# Patient Record
Sex: Female | Born: 1935 | Race: White | Hispanic: No | Marital: Single | Smoking: Never smoker
Health system: Southern US, Community
[De-identification: ages and names within clinical notes are randomized; demographics above are authoritative.]

## PROBLEM LIST (undated history)

## (undated) DIAGNOSIS — I10 Essential (primary) hypertension: Secondary | ICD-10-CM

## (undated) DIAGNOSIS — E0590 Thyrotoxicosis, unspecified without thyrotoxic crisis or storm: Secondary | ICD-10-CM

## (undated) DIAGNOSIS — M109 Gout, unspecified: Secondary | ICD-10-CM

## (undated) DIAGNOSIS — F419 Anxiety disorder, unspecified: Secondary | ICD-10-CM

## (undated) DIAGNOSIS — Z9981 Dependence on supplemental oxygen: Secondary | ICD-10-CM

## (undated) DIAGNOSIS — I071 Rheumatic tricuspid insufficiency: Secondary | ICD-10-CM

## (undated) DIAGNOSIS — R7303 Prediabetes: Secondary | ICD-10-CM

## (undated) DIAGNOSIS — E669 Obesity, unspecified: Secondary | ICD-10-CM

## (undated) DIAGNOSIS — Z8489 Family history of other specified conditions: Secondary | ICD-10-CM

## (undated) DIAGNOSIS — I4819 Other persistent atrial fibrillation: Secondary | ICD-10-CM

## (undated) DIAGNOSIS — R0609 Other forms of dyspnea: Secondary | ICD-10-CM

## (undated) DIAGNOSIS — M1990 Unspecified osteoarthritis, unspecified site: Secondary | ICD-10-CM

## (undated) DIAGNOSIS — M545 Low back pain, unspecified: Secondary | ICD-10-CM

## (undated) DIAGNOSIS — I340 Nonrheumatic mitral (valve) insufficiency: Secondary | ICD-10-CM

## (undated) DIAGNOSIS — J189 Pneumonia, unspecified organism: Secondary | ICD-10-CM

## (undated) DIAGNOSIS — I499 Cardiac arrhythmia, unspecified: Secondary | ICD-10-CM

## (undated) DIAGNOSIS — R0600 Dyspnea, unspecified: Secondary | ICD-10-CM

## (undated) DIAGNOSIS — G8929 Other chronic pain: Secondary | ICD-10-CM

## (undated) HISTORY — PX: SHOULDER ARTHROSCOPY: SHX128

## (undated) HISTORY — DX: Thyrotoxicosis, unspecified without thyrotoxic crisis or storm: E05.90

## (undated) HISTORY — DX: Obesity, unspecified: E66.9

## (undated) HISTORY — DX: Dyspnea, unspecified: R06.00

## (undated) HISTORY — DX: Other persistent atrial fibrillation: I48.19

## (undated) HISTORY — DX: Rheumatic tricuspid insufficiency: I07.1

## (undated) HISTORY — PX: EYE SURGERY: SHX253

## (undated) HISTORY — PX: CATARACT EXTRACTION W/ INTRAOCULAR LENS  IMPLANT, BILATERAL: SHX1307

## (undated) HISTORY — DX: Unspecified osteoarthritis, unspecified site: M19.90

## (undated) HISTORY — DX: Nonrheumatic mitral (valve) insufficiency: I34.0

## (undated) HISTORY — PX: EXCISIONAL HEMORRHOIDECTOMY: SHX1541

## (undated) HISTORY — PX: TONSILLECTOMY: SUR1361

## (undated) HISTORY — DX: Essential (primary) hypertension: I10

## (undated) HISTORY — DX: Other forms of dyspnea: R06.09

---

## 1961-07-08 HISTORY — PX: OVARIAN CYST REMOVAL: SHX89

## 1974-07-08 HISTORY — PX: KNEE CARTILAGE SURGERY: SHX688

## 1998-09-27 ENCOUNTER — Other Ambulatory Visit: Admission: RE | Admit: 1998-09-27 | Discharge: 1998-09-27 | Payer: Self-pay | Admitting: *Deleted

## 1999-11-21 ENCOUNTER — Other Ambulatory Visit: Admission: RE | Admit: 1999-11-21 | Discharge: 1999-11-21 | Payer: Self-pay | Admitting: *Deleted

## 1999-11-29 ENCOUNTER — Encounter: Admission: RE | Admit: 1999-11-29 | Discharge: 1999-11-29 | Payer: Self-pay | Admitting: Family Medicine

## 1999-11-29 ENCOUNTER — Encounter: Payer: Self-pay | Admitting: Family Medicine

## 2000-11-26 ENCOUNTER — Other Ambulatory Visit: Admission: RE | Admit: 2000-11-26 | Discharge: 2000-11-26 | Payer: Self-pay | Admitting: *Deleted

## 2001-12-17 ENCOUNTER — Other Ambulatory Visit: Admission: RE | Admit: 2001-12-17 | Discharge: 2001-12-17 | Payer: Self-pay | Admitting: *Deleted

## 2001-12-28 ENCOUNTER — Encounter: Payer: Self-pay | Admitting: *Deleted

## 2001-12-28 ENCOUNTER — Encounter: Admission: RE | Admit: 2001-12-28 | Discharge: 2001-12-28 | Payer: Self-pay | Admitting: *Deleted

## 2004-04-05 ENCOUNTER — Other Ambulatory Visit: Admission: RE | Admit: 2004-04-05 | Discharge: 2004-04-05 | Payer: Self-pay | Admitting: *Deleted

## 2004-04-17 ENCOUNTER — Encounter: Admission: RE | Admit: 2004-04-17 | Discharge: 2004-04-17 | Payer: Self-pay | Admitting: *Deleted

## 2004-04-27 ENCOUNTER — Encounter: Admission: RE | Admit: 2004-04-27 | Discharge: 2004-04-27 | Payer: Self-pay | Admitting: *Deleted

## 2004-07-26 ENCOUNTER — Ambulatory Visit: Payer: Self-pay | Admitting: Internal Medicine

## 2004-08-13 ENCOUNTER — Ambulatory Visit: Payer: Self-pay | Admitting: Internal Medicine

## 2004-10-09 ENCOUNTER — Encounter: Admission: RE | Admit: 2004-10-09 | Discharge: 2004-10-09 | Payer: Self-pay | Admitting: *Deleted

## 2004-12-20 ENCOUNTER — Encounter: Admission: RE | Admit: 2004-12-20 | Discharge: 2004-12-20 | Payer: Self-pay | Admitting: General Surgery

## 2005-01-22 ENCOUNTER — Ambulatory Visit: Payer: Self-pay | Admitting: Internal Medicine

## 2005-04-18 ENCOUNTER — Encounter: Admission: RE | Admit: 2005-04-18 | Discharge: 2005-04-18 | Payer: Self-pay | Admitting: Obstetrics and Gynecology

## 2005-05-08 ENCOUNTER — Encounter: Admission: RE | Admit: 2005-05-08 | Discharge: 2005-05-24 | Payer: Self-pay | Admitting: Neurosurgery

## 2005-06-14 ENCOUNTER — Other Ambulatory Visit: Admission: RE | Admit: 2005-06-14 | Discharge: 2005-06-14 | Payer: Self-pay | Admitting: Obstetrics and Gynecology

## 2006-01-21 ENCOUNTER — Ambulatory Visit: Payer: Self-pay | Admitting: Internal Medicine

## 2006-06-12 ENCOUNTER — Encounter: Admission: RE | Admit: 2006-06-12 | Discharge: 2006-06-12 | Payer: Self-pay | Admitting: Obstetrics and Gynecology

## 2006-06-24 ENCOUNTER — Encounter: Admission: RE | Admit: 2006-06-24 | Discharge: 2006-06-24 | Payer: Self-pay | Admitting: Obstetrics and Gynecology

## 2007-01-01 ENCOUNTER — Emergency Department (HOSPITAL_COMMUNITY): Admission: EM | Admit: 2007-01-01 | Discharge: 2007-01-01 | Payer: Self-pay | Admitting: Emergency Medicine

## 2007-05-06 ENCOUNTER — Ambulatory Visit: Payer: Self-pay | Admitting: Internal Medicine

## 2007-09-02 ENCOUNTER — Encounter: Admission: RE | Admit: 2007-09-02 | Discharge: 2007-09-02 | Payer: Self-pay | Admitting: Family Medicine

## 2008-05-04 DIAGNOSIS — J309 Allergic rhinitis, unspecified: Secondary | ICD-10-CM | POA: Insufficient documentation

## 2008-05-04 DIAGNOSIS — J45998 Other asthma: Secondary | ICD-10-CM

## 2008-05-05 ENCOUNTER — Ambulatory Visit: Payer: Self-pay | Admitting: Internal Medicine

## 2009-02-07 ENCOUNTER — Telehealth (INDEPENDENT_AMBULATORY_CARE_PROVIDER_SITE_OTHER): Payer: Self-pay | Admitting: *Deleted

## 2009-02-07 ENCOUNTER — Ambulatory Visit: Payer: Self-pay | Admitting: Internal Medicine

## 2009-05-04 ENCOUNTER — Ambulatory Visit: Payer: Self-pay | Admitting: Internal Medicine

## 2009-05-22 ENCOUNTER — Encounter: Payer: Self-pay | Admitting: Internal Medicine

## 2009-09-04 ENCOUNTER — Ambulatory Visit: Payer: Self-pay | Admitting: Internal Medicine

## 2009-09-26 ENCOUNTER — Ambulatory Visit: Payer: Self-pay | Admitting: Internal Medicine

## 2010-02-02 ENCOUNTER — Telehealth: Payer: Self-pay | Admitting: Internal Medicine

## 2010-02-02 ENCOUNTER — Ambulatory Visit: Payer: Self-pay | Admitting: Internal Medicine

## 2010-07-29 ENCOUNTER — Encounter: Payer: Self-pay | Admitting: Obstetrics and Gynecology

## 2010-08-07 NOTE — Progress Notes (Signed)
Summary: appt  Phone Note Call from Patient Call back at Home Phone [phone]   Caller: Patient Call For: Kaiyu Mirabal Reason for Call: Talk to Nurse Summary of Call: Bronchitis x 1 week, cough, congestion, has been taking antibiotic, took a little cough med, but hasn't really helped.  Would like to see CDY today if possible. Initial call taken by: Eugene Gavia,  February 02, 2010 8:29 AM  Follow-up for Phone Call        Spoke with pt.  Pt c/o prod cough with a small amount of green mucus and nasal and chest congestion x 1 wk.  Denies f/c/s.  Took sulfa two times a day x 1wk -- this is "helping but not doing the job."  Requesting to see Dr. Maple Hudson today.  Dr. Maple Hudson, pls advise if you would like to work pt in today.  Thanks! Gweneth Dimitri RN  February 02, 2010 9:15 AM   Additional Follow-up for Phone Call Additional follow up Details #1::        OK Additional Follow-up by: Waymon Budge MD,  February 02, 2010 3:57 PM

## 2010-08-07 NOTE — Assessment & Plan Note (Signed)
Summary: accute visit-cough,congestion(nasal and chest)/kcw   Primary Provider/Referring Provider:  Benedetto Goad  CC:  Accute visit-? sinus infection and bronchitis; started sulfa abx x 1week and no help.Marland Kitchen  History of Present Illness: September 04, 2009--Presents for an acute office visit. Complains of SOB, tightness in chest, cough with small amounts of clear mucus x2.5weeks . Seen by PCP initially , rx amoxicillin, zpack and prednisone taper. This helped but not totally resolved. Still has alot of coughing and wheeizng. Finished zpack /steroids since then. XRAY per pt w/ no acute changes. Last xray here 10/10 w/ chronic changes. Has had several episodes over last years of recurrent bronchitis. Denies chest pain,  orthopnea, hemoptysis, fever, n/v/d, edema, headache  September 26, 2009- Asthma, allergic rhinitis Was seen by NP for an acute bronchitis- her third since July. When here with NP got prednisone taper, ventolin and cough syrup. Says she feels very much better.The benzonatate mad her very drowsy. Denies sneeze, drip, cough or wheeze. Not bothered yet this Spring by obvious seasonal allergy, but she expects to be later in season.  February 02, 2010- Asthma, allergic rhinits Acute visit- Got hot and chilled in and out of [facility] on a bus trip recently and now for past week has head and chest congestion with some green but no fever or sore throat and no GI. Finishing a week of sulfa drug that didn't help.    Asthma History    Initial Asthma Severity Rating:    Age range: 12+ years    Symptoms: 0-2 days/week    Nighttime Awakenings: 0-2/month    Interferes w/ normal activity: no limitations    SABA use (not for EIB): 0-2 days/week    Asthma Severity Assessment: Intermittent   Preventive Screening-Counseling & Management  Alcohol-Tobacco     Smoking Status: never  Current Medications (verified): 1)  Qvar 80 Mcg/act Aers (Beclomethasone Dipropionate) .Marland Kitchen.. 1 Puff and Rinse Twice Daily 2)   Foradil Aerolizer 12 Mcg Caps (Formoterol Fumarate) .... Two Times A Day 3)  Cardizem Cd 360 Mg Xr24h-Cap (Diltiazem Hcl Coated Beads) .... Take 1 Tablet By Mouth Once A Day 4)  Premarin 0.9 Mg Tabs (Estrogens Conjugated) .... Take 1 Tablet By Mouth Once A Day 5)  Diovan 160 Mg Tabs (Valsartan) .... Take 1 Tablet By Mouth Once A Day 6)  Clobetasol Propionate 0.05 % Oint (Clobetasol Propionate) .... As Needed 7)  Medroxyprogesterone Acetate 5 Mg Tabs (Medroxyprogesterone Acetate) .... Take 1 Tablet By Mouth Once A Day 8)  Methimazole 10 Mg Tabs (Methimazole) .... Once Daily 9)  Ventolin Hfa 108 (90 Base) Mcg/act Aers (Albuterol Sulfate) .... Inhale 2 Puffs Every Four Hours As Needed  Allergies (verified): 1)  ! Codeine 2)  ! 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  The patient denies shortness of breath at rest, productive cough, coughing up  blood, chest pain, irregular heartbeats, acid heartburn, indigestion, loss of appetite, weight change, abdominal pain, difficulty swallowing, sore throat, tooth/dental problems, headaches, nasal congestion/difficulty breathing through nose, and sneezing.    Vital Signs:  Patient profile:   77 year old female Height:      64 inches Weight:      197.25 pounds BMI:      33.98 O2 Sat:      96 % on Room air Pulse rate:   67 / minute BP sitting:   120 / 58  (left arm) Cuff size:   large  Vitals Entered By: Reynaldo Minium CMA (February 02, 2010 3:04 PM)  O2 Flow:  Room air CC: Accute visit-? sinus infection and bronchitis; started sulfa abx x 1week and no help.   Physical Exam  Additional Exam:  General: A/Ox3; pleasant and cooperative, NAD, SKIN: no rash, lesions NODES: no lymphadenopathy HEENT: [place]/AT, EOM- WNL, Conjuctivae- clear, PERRLA, TM-WNL, Nose- clear, Throat- clear and wnl, Mallampatti III-IV NECK: Supple w/ fair ROM, JVD- none, normal carotid impulses w/o bruits Thyroid-  CHEST:Very clear HEART: RRR, no m/g/r heard ABDOMEN: Soft and nl; ZDG:UYQI, nl pulses, no edema  NEURO: Grossly intact to observation      Impression & Recommendations:  Problem # 1:  ALLERGIC RHINITIS (ICD-477.9)  Rhinosinusitis. She describes a sinus infection that responded to sulfa over a month ago. We will give Z pak with a standby script for prednsione. Encourage fluids. Prepared to extend antibiotic, add decongestant, Neti pot and mucolytic if needed.  Problem # 2:  ASTHMA (ICD-493.90) She has not yet lost control. We discussed meds.  Medications Added to Medication List This Visit: 1)  Prednisone 10 Mg Tabs (Prednisone) .Marland Kitchen.. 1 tab four times daily x 2 days, 3 times daily x 2 days, 2 times daily x 2 days, 1 time daily x 2 days 2)  Zithromax Z-pak 250 Mg Tabs (Azithromycin) .... 2 today then one daily  Other Orders: Est. Patient Level IV (34742)  Patient Instructions: 1)  Please schedule a follow-up appointment in 1 year or as scheduled 2)  Script for Z pak antibiotic and standby script for some prednisone if needed. Prescriptions: ZITHROMAX Z-PAK 250 MG TABS (AZITHROMYCIN) 2 today then one daily  #1 pak x 0   Entered and Authorized by:   Waymon Budge MD   Signed by:   Waymon Budge MD on 02/02/2010   Method used:   Electronically to         [facility] [address] [phone]* (retail)       [address] [place], [place]  [address]       Ph: [phone]       Fax: [phone]   RxID:   [id number] PREDNISONE 10 MG TABS (PREDNISONE) 1 tab four times daily x 2 days, 3 times daily x 2 days, 2 times daily x 2 days, 1 time daily x 2 days  #20 x 0   Entered and Authorized by:   Waymon Budge MD   Signed by:   Waymon Budge MD on 02/02/2010   Method used:   Electronically to        [facility] [address] [phone]* (retail)       [address] [place], [place]  [address]       Ph: [phone]       Fax: [phone]   RxID:   [id number]

## 2010-08-07 NOTE — Assessment & Plan Note (Signed)
Summary: 2wk reck/klw   Primary Provider/Referring Provider:  Benedetto Goad  CC:  2 wk follow up.  states breathing is better.  states cough and chest tightness have resolved.  denies wheezing.  No complaints.  .  History of Present Illness:  May 04, 2009- Asthma, allergic rhinitis Saqys she has had only two real asthma"attacks" in her life- the second being when she saw NP in August. Likes the current cool weather much better than the summer heat. Has had flu She continues Foradil and Qvar. Doesn't need rescue inhaler often.  Aware she snores, but denies daytime tiredness. We discussed sleep apnea.  September 04, 2009--Presents for an acute office visit. Complains of SOB, tightness in chest, cough with small amounts of clear mucus x2.5weeks . Seen by PCP initially , rx amoxicillin, zpack and prednisone taper. This helped but not totally resolved. Still has alot of coughing and wheeizng. Finished zpack /steroids since then. XRAY per pt w/ no acute changes. Last xray here 10/10 w/ chronic changes. Has had several episodes over last years of recurrent bronchitis. Denies chest pain,  orthopnea, hemoptysis, fever, n/v/d, edema, headache  September 26, 2009- Asthma, allergic rhinitis Was seen by NP for an acute bronchitis- her third since July. When here with NP got prednisone taper, ventolin and cough syrup. Says she feels very much better.The benzonatate mad her very drowsy. Denies sneeze, drip, cough or wheeze. Not bothered yet this Spring by obvious seasonal allergy, but she expects to be later in season.    Current Medications (verified): 1)  Qvar 80 Mcg/act Aers (Beclomethasone Dipropionate) .Marland Kitchen.. 1 Puff and Rinse Twice Daily 2)  Foradil Aerolizer 12 Mcg Caps (Formoterol Fumarate) .... Two Times A Day 3)  Cardizem Cd 360 Mg Xr24h-Cap (Diltiazem Hcl Coated Beads) .... Take 1 Tablet By Mouth Once A Day 4)  Premarin 0.9 Mg Tabs (Estrogens Conjugated) .... Take 1 Tablet By Mouth Once A Day 5)   Diovan 160 Mg Tabs (Valsartan) .... Take 1 Tablet By Mouth Once A Day 6)  Clobetasol Propionate 0.05 % Oint (Clobetasol Propionate) .... As Needed 7)  Medroxyprogesterone Acetate 5 Mg Tabs (Medroxyprogesterone Acetate) .... Take 1 Tablet By Mouth Once A Day 8)  Methimazole 10 Mg Tabs (Methimazole) .... Once Daily 9)  Ventolin Hfa 108 (90 Base) Mcg/act Aers (Albuterol Sulfate) .... Inhale 2 Puffs Every Four Hours As Needed 10)  Hydromet 5-1.5 Mg/33ml Syrp (Hydrocodone-Homatropine) .Marland Kitchen.. 1 Tsp Every 4-6 Hrs As Needed Cough  Allergies (verified): 1)  ! Codeine 2)  ! 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    Vital Signs:  Patient profile:   77 year old female Height:      64 inches Weight:      196.25 pounds BMI:     33.81  O2 Sat:      98 % on Room air Pulse rate:   62 / minute BP sitting:   130 / 68  (right arm) Cuff size:   regular  Vitals Entered By: Gweneth Dimitri RN (September 26, 2009 10:15 AM)  O2 Flow:  Room air CC: 2 wk follow up.  states breathing is better.  states cough and chest tightness have resolved.  denies wheezing.  No complaints.     Comments Medications reviewed with patient Daytime contact number verified with patient. Gweneth Dimitri RN  September 26, 2009 10:16 AM    Physical Exam  Additional Exam:  General: A/Ox3; pleasant and cooperative, NAD, SKIN: no rash, lesions NODES: no lymphadenopathy HEENT: [place]/AT, EOM- WNL, Conjuctivae- clear, PERRLA, TM-WNL, Nose- clear, Throat- clear and wnl, Melampatti III-IV NECK: Supple w/ fair ROM, JVD- none, normal carotid impulses w/o bruits Thyroid-  CHEST:Very clear HEART: RRR, no m/g/r heard ABDOMEN: Soft and nl; ZOX:WRUE, nl pulses, no edema  NEURO: Grossly intact to observation      Impression & Recommendations:  Problem # 1:  ASTHMA (ICD-493.90) She seems to have fully resolved a viral pattern acute bronchits. Today she is well. we will see her in the Fall unless needed sooner.  Problem # 2:  ALLERGIC RHINITIS (ICD-477.9)  She will turn first to otc antihistamine if needed and call for help when appropriate.  Medications Added to Medication List This Visit: 1)  Methimazole 10 Mg Tabs (Methimazole) .... Once daily  Other Orders: Est. Patient Level II (45409)  Patient Instructions: 1)  Please schedule a follow-up appointment in 6 months. 2)  Call sooner if needed   Immunization History:  Influenza Immunization History:    Influenza:  historical (04/07/2009)  Pneumovax Immunization History:    Pneumovax:  historical (04/07/2006)

## 2010-08-07 NOTE — Assessment & Plan Note (Signed)
Summary: Acute NP office visit - asthma   Primary Provider/Referring Provider:  Benedetto Goad  CC:  SOB, tightness in chest, and cough with small amounts of clear mucus x2.5weeks - denies f/c/s.  History of Present Illness: From 05/09/07- HISTORY:  She never started Asmanex, saying that she never understood the device, although we explained it to her. She had been a Chartered loss adjuster. Hot weather bothers her and she is more comfortable now that fall has arrived. We discussed steroids and long acting beta adrenergic inhalers. She has had flu vaccine.   05/05/08-Good year. Denies cough, wheeze, phlegm, nasal congestio, headache, chest pain. No signicant infection or acute issue since last here.  February 07, 2009 --Presents for an acute office visit. Complains of  increased SOB and tightness in chest., wheezing w/ cough for 1 week. Seen initially by primary MD given zpack , some help but not totally resolved. Started on prednisone yesterday, some wheeizng today. Denies chest pain,  orthopnea, hemoptysis, fever, n/v/d, edema, headache.   May 04, 2009- Asthma, allergic rhinitis Saqys she has had only two real asthma"attacks" in her life- the second being when she saw NP in August. Likes the current cool weather much better than the summer heat. Has had flu She continues Foradil and Qvar. Doesn't need rescue inhaler often.  Aware she snores, but denies daytime tiredness. We discussed sleep apnea.  September 04, 2009--Presents for an acute office visit. Complains of SOB, tightness in chest, cough with small amounts of clear mucus x2.5weeks . Seen by PCP initially , rx amoxicillin, zpack and prednisone taper. This helped but not totally resolved. Still has alot of coughing and wheeizng. Finished zpack /steroids since then. XRAY per pt w/ no acute changes. Last xray here 10/10 w/ chronic changes. Has had several episodes over last years of recurrent bronchitis. Denies chest pain,  orthopnea, hemoptysis,  fever, n/v/d, edema, headache  Medications Prior to Update: 1)  Qvar 80 Mcg/act Aers (Beclomethasone Dipropionate) .Marland Kitchen.. 1 Puff and Rinse Twice Daily 2)  Foradil Aerolizer 12 Mcg Caps (Formoterol Fumarate) .... Two Times A Day 3)  Cardizem Cd 360 Mg Xr24h-Cap (Diltiazem Hcl Coated Beads) .... Take 1 Tablet By Mouth Once A Day 4)  Premarin 0.9 Mg Tabs (Estrogens Conjugated) .... Take 1 Tablet By Mouth Once A Day 5)  Diovan 160 Mg Tabs (Valsartan) .... Take 1 Tablet By Mouth Once A Day 6)  Clobetasol Propionate 0.05 % Oint (Clobetasol Propionate) .... As Needed 7)  Medroxyprogesterone Acetate 5 Mg Tabs (Medroxyprogesterone Acetate) .... Take 1 Tablet By Mouth Once A Day 8)  Ventolin Hfa 108 (90 Base) Mcg/act Aers (Albuterol Sulfate) .... As Needed 9)  Methimazole 5 Mg Tabs (Methimazole) .... Take 1 Tablet By Mouth Once A Day  Current Medications (verified): 1)  Cardizem Cd 360 Mg Xr24h-Cap (Diltiazem Hcl Coated Beads) .... Take 1 Tablet By Mouth Once A Day 2)  Foradil Aerolizer 12 Mcg Caps (Formoterol Fumarate) .... Two Times A Day 3)  Premarin 0.9 Mg Tabs (Estrogens Conjugated) .... Take 1 Tablet By Mouth Once A Day 4)  Diovan 160 Mg Tabs (Valsartan) .... Take 1 Tablet By Mouth Once A Day 5)  Clobetasol Propionate 0.05 % Oint (Clobetasol Propionate) .... As Needed 6)  Medroxyprogesterone Acetate 5 Mg Tabs (Medroxyprogesterone Acetate) .... Take 1 Tablet By Mouth Once A Day 7)  Methimazole 5 Mg Tabs (Methimazole) .... Take 1 Tablet By Mouth Once A Day 8)  Ventolin Hfa 108 (90 Base) Mcg/act Aers (Albuterol Sulfate) .Marland KitchenMarland KitchenMarland Kitchen  Inhale 2 Puffs Every Four Hours As Needed 9)  Qvar 80 Mcg/act Aers (Beclomethasone Dipropionate) .Marland Kitchen.. 1 Puff and Rinse Twice Daily  Allergies (verified): 1)  ! 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 No Comments Medications reviewed with patient Daytime contact number verified with patient. Boone Master CNA  September 04, 2009 12:19 PM    Physical Exam  Additional Exam:  General: A/Ox3; pleasant and cooperative, NAD, SKIN: no rash, lesions NODES: no lymphadenopathy HEENT: [place]/AT, EOM- WNL, Conjuctivae- clear, PERRLA, TM-WNL, Nose- clear, Throat- clear and wnl, Melampatti III-IV NECK: Supple w/ fair ROM, JVD- none, normal carotid impulses w/o bruits Thyroid- normal to palpation CHEST: Coarse BS w. no wheezing.  HEART: RRR, no m/g/r heard ABDOMEN: Soft and nl; VHQ:IONG, nl pulses, no edema  NEURO: Grossly intact to observation      Impression & Recommendations:  Problem # 1:  ASTHMA (ICD-493.90) Recurrent bronchitis REC:  Prednisone taper as directed.  Mucinex DM two times a day for cough/congestion Dexilant 60mg  once daily (to prevent reflux) Tessalon three times a day as needed cough Hydromet 1 tsp every 4-6 hr as needed severe cough, may make you sleepy.  follow up Dr. Maple Hudson in 2 weeks.  Please  contact office for sooner follow up if symptoms do not improve or worsen  Use sugarless candy, ice chips, water to help avoid throat clearing and cough-NO MINTS.   Medications Added to Medication List This Visit: 1)  Ventolin Hfa 108 (90 Base) Mcg/act Aers (Albuterol sulfate) .... Inhale 2 puffs every four hours as needed 2)  Prednisone 10 Mg Tabs (Prednisone) .... 4 tabs for 3  days, then 3 tabs for 3 days, 2 tabs for 3  days, then 1 tab for 3  days, then stop 3)  Benzonatate 200 Mg Caps (Benzonatate) .Marland Kitchen.. 1 by mouth three times a day as needed cough 4)  Hydromet 5-1.5 Mg/52ml Syrp (Hydrocodone-homatropine) .Marland Kitchen.. 1 tsp every 4-6 hrs as needed cough  Complete Medication List: 1)  Qvar 80 Mcg/act Aers (Beclomethasone dipropionate) .Marland Kitchen.. 1 puff and rinse twice daily 2)  Foradil Aerolizer 12 Mcg Caps (Formoterol fumarate) .... Two times a day 3)  Cardizem Cd 360 Mg Xr24h-cap (Diltiazem hcl coated beads) .... Take 1 tablet by mouth once a day 4)  Premarin 0.9 Mg  Tabs (Estrogens conjugated) .... Take 1 tablet by mouth once a day 5)  Diovan 160 Mg Tabs (Valsartan) .... Take 1 tablet by mouth once a day 6)  Clobetasol Propionate 0.05 % Oint (Clobetasol propionate) .... As needed 7)  Medroxyprogesterone Acetate 5 Mg Tabs (Medroxyprogesterone acetate) .... Take 1 tablet by mouth once a day 8)  Methimazole 5 Mg Tabs (Methimazole) .... Take 1 tablet by mouth once a day 9)  Ventolin Hfa 108 (90 Base) Mcg/act Aers (Albuterol sulfate) .... Inhale 2 puffs every four hours as needed 10)  Prednisone 10 Mg Tabs (Prednisone) .... 4 tabs for 3  days, then 3 tabs for 3 days, 2 tabs for 3  days, then 1 tab for 3  days, then stop 11)  Benzonatate 200 Mg Caps (Benzonatate) .Marland Kitchen.. 1 by mouth three times a day as needed cough 12)  Hydromet 5-1.5 Mg/13ml Syrp (Hydrocodone-homatropine) .Marland Kitchen.. 1 tsp every 4-6 hrs as needed cough  Other Orders: Nebulizer Tx ([id number]) Est. Patient Level IV (96295)  Patient Instructions: 1)   Prednisone taper as directed.  2)  Mucinex DM two times a day for cough/congestion 3)  Dexilant 60mg  once daily (to prevent reflux) 4)  Tessalon three times a day as needed cough 5)  Hydromet 1 tsp every 4-6 hr as needed severe cough, may make you sleepy.  6)  follow up Dr. Maple Hudson in 2 weeks.  7)  Please contact office for sooner follow up if symptoms do not improve or worsen  8)  Use sugarless candy, ice chips, water to help avoid throat clearing and cough-NO MINTS.  Prescriptions: HYDROMET 5-1.5 MG/5ML SYRP (HYDROCODONE-HOMATROPINE) 1 tsp every 4-6 hrs as needed cough  #8 oz x 0   Entered and Authorized by:   Rubye Oaks NP   Signed by:   Juda Lajeunesse NP on 09/04/2009   Method used:   Print then Give to Patient   RxID:   [id number] BENZONATATE 200 MG CAPS (BENZONATATE) 1 by mouth three times a day as needed cough  #30 x 0   Entered and Authorized by:   Rubye Oaks NP   Signed by:   Dolora Ridgely NP on 09/04/2009   Method used:   Electronically to        [facility] [id number]* (retail)       [address] [place], [place]  [address]       Ph: [phone]       Fax: [phone]   RxID:   [id number] PREDNISONE 10 MG TABS (PREDNISONE) 4 tabs for 3  days, then 3 tabs for 3 days, 2 tabs for 3  days, then 1 tab for 3  days, then stop  #30 x 0   Entered and Authorized by:   Rubye Oaks NP   Signed by:   Rubye Oaks NP on 09/04/2009   Method used:   Electronically to        [facility] [id number]* (retail)       [address] [place], [place]  [address]       Ph: [phone]       Fax: [phone]   RxID:   [id number]

## 2010-09-04 ENCOUNTER — Institutional Professional Consult (permissible substitution) (INDEPENDENT_AMBULATORY_CARE_PROVIDER_SITE_OTHER): Payer: Medicare Other | Admitting: Cardiovascular Disease

## 2010-09-04 DIAGNOSIS — R55 Syncope and collapse: Secondary | ICD-10-CM

## 2010-09-04 DIAGNOSIS — I119 Hypertensive heart disease without heart failure: Secondary | ICD-10-CM

## 2010-09-07 ENCOUNTER — Ambulatory Visit (INDEPENDENT_AMBULATORY_CARE_PROVIDER_SITE_OTHER): Payer: Medicare Other | Admitting: Cardiology

## 2010-09-07 DIAGNOSIS — I440 Atrioventricular block, first degree: Secondary | ICD-10-CM

## 2010-09-07 DIAGNOSIS — R55 Syncope and collapse: Secondary | ICD-10-CM

## 2010-09-13 ENCOUNTER — Ambulatory Visit (HOSPITAL_COMMUNITY): Payer: Medicare Other | Attending: Cardiovascular Disease

## 2010-09-13 ENCOUNTER — Encounter: Payer: Self-pay | Admitting: Cardiovascular Disease

## 2010-09-13 DIAGNOSIS — R55 Syncope and collapse: Secondary | ICD-10-CM | POA: Insufficient documentation

## 2010-09-13 DIAGNOSIS — J45909 Unspecified asthma, uncomplicated: Secondary | ICD-10-CM | POA: Insufficient documentation

## 2010-10-09 ENCOUNTER — Ambulatory Visit (HOSPITAL_COMMUNITY)
Admission: RE | Admit: 2010-10-09 | Discharge: 2010-10-09 | Disposition: A | Discharge status: Home / Self Care | Payer: Medicare Other | Source: Ambulatory Visit | Attending: Family Medicine | Admitting: Family Medicine

## 2010-10-09 DIAGNOSIS — M7989 Other specified soft tissue disorders: Secondary | ICD-10-CM | POA: Insufficient documentation

## 2010-10-09 DIAGNOSIS — M79609 Pain in unspecified limb: Secondary | ICD-10-CM | POA: Insufficient documentation

## 2010-11-20 NOTE — Assessment & Plan Note (Signed)
[facility]                             PULMONARY OFFICE NOTE   NAME:Crystal Hess, Crystal Hess                    MRN:          [id number]  DATE:05/06/2007                            DOB:          1929-11-01    PROBLEMS:  1. Asthma.  2. Allergic rhinitis.   HISTORY:  She never started Asmanex, saying that she never understood  the device, although we explained it to her. She had been a  Chartered loss adjuster. Hot weather bothers her and she is more comfortable now  that fall has arrived. We discussed steroids and long acting beta  adrenergic inhalers. She has had flu vaccine.   MEDICATIONS:  1. Cardizem 360 mg.  2. Foradil b.i.d.  3. Premarin 0.95.  4. Diovan 160 mg.  5. Medroxy.  6. Progesterone.  7. Methimazole 5 mg.  8. Clobetasol ointment.  9. Aspirin 81 mg.  10.Calcium.  11.Claritin.  12.Albuterol inhaler (Ventolin HFA).   Drug intolerant CODEINE.   OBJECTIVE:  Weight 179 pounds, blood pressure 146/78, pulse 82, room air  saturation 96%. Eyes, nose, and throat look clear. Lungs are clear.  Heart sounds are regular without murmur.   IMPRESSION:  Asthma, currently stable.   PLAN:  1. We refilled Foradil and her rescue albuterol inhaler.  2. We are changing Asmanex to more familiar dispenser device Qvar 80      to take 1 puff b.i.d.      every day, rinsing mouth as discussed.  3. Schedule return 1 year, earlier p.r.n.     Clinton D. Maple Hudson, MD, FCCP, FACP     CDY/MedQ  DD: 05/09/2007  DT: 05/11/2007  Job #: [id number]   cc:   Crystal Hess. Andrey Campanile, M.D.

## 2010-11-20 NOTE — Assessment & Plan Note (Signed)
[facility]                             PULMONARY OFFICE NOTE   NAME:Soltero, CLAIR BARDWELL                    MRN:          [id number]  DATE:05/06/2007                            DOB:          [birth year]    PROBLEMS:  1. Asthma.  2. Allergic rhinitis.   HISTORY:  She never started Asmanex, saying that she never understood  the device, although we explained it to her. She had been a  Chartered loss adjuster. Hot weather bothers her and she is more comfortable now  that fall has arrived. We discussed steroids and long acting beta  adrenergic inhalers. She has had flu vaccine.   MEDICATIONS:  1. Cardizem 360 mg.  2. Foradil b.i.d.  3. Premarin 0.95.  4. Diovan 160 mg.  5. Medroxy.  6. Progesterone.  7. Methimazole 5 mg.  8. Clobetasol ointment.  9. Aspirin 81 mg.  10.Calcium.  11.Claritin.  12.Albuterol inhaler (Ventolin HFA).   Drug intolerant CODEINE.   OBJECTIVE:  Weight 179 pounds, blood pressure 146/78, pulse 82, room air  saturation 96%. Eyes, nose, and throat look clear. Lungs are clear.  Heart sounds are regular without murmur.   IMPRESSION:  Asthma, currently stable.   PLAN:  1. We refilled Foradil and her rescue albuterol inhaler.  2. We are changing Asmanex to more familiar dispenser device Qvar 80      to take 1 puff b.i.d.      every day, rinsing mouth as discussed.  3. Schedule return 1 year, earlier p.r.n.     Clinton D. Maple Hudson, MD, Tonny Bollman, FACP  Electronically Signed    CDY/MedQ  DD: 05/09/2007  DT: 05/11/2007  Job #: [id number]   cc:   Gloriajean Dell. Andrey Campanile, M.D.

## 2010-11-23 NOTE — Assessment & Plan Note (Signed)
[facility]                               PULMONARY OFFICE NOTE   NAME:Crystal Hess, Crystal Hess                    MRN:          [id number]  DATE:01/21/2006                            DOB:          Sep 19, 1929    PROBLEM LIST:  1.  Asthma.  2.  Allergic rhinitis.   HISTORY:  She reports doing quite well with better exercise tolerance.  She  has liked Foradil.  We talked today about long acting beta adrenergic drugs  and maintenance steroid therapy.  We reviewed her pulmonary function test  from February, 2006 which had shown normal spirometry with an insignificant  response to bronchodilator and an FEV-1 of 2.10 liters or 107% of predicted  with a clarification that she does not have chronic obstructive pulmonary  disease and never smoked.  She notices little or no cough, sputum or wheeze  and has had a very good spring.   MEDICATIONS:  1.  Cardizem 360 mg.  2.  Foradil b.i.d.  3.  Premarin.  4.  Diovan 160 mg.  5.  Medroxyprogesterone 5 mg.  6.  Methimazole 5 mg.  7.  Fosamax 70 mg.  8.  Clobetasol.  9.  Aspirin.  10. Calcium.  11. Albuterol inhaler.  12. Diclofenac.   DRUG INTOLERANCE:  CODEINE.   OBJECTIVE:  VITAL SIGNS:  Weight 198 pounds, blood pressure 162/70, pulse  regular at 77, room air saturation 98%.  GENERAL:  She looks quite comfortable.  HEENT:  Eyes, nose and throat are clear.  LUNGS:  Distinctly clear to P&A with unlabored breathing.  CARDIAC:     Heart sounds are regular except for 1 skipped beat with no  murmur or gallop.  NECK:  There is no adenopathy.  EXTREMITIES:  No edema.   IMPRESSION:  Mild intermittent asthma well controlled.  I would prefer her  to be managed with a maintenance steroid inhaler looking to back away from  her long acting bronchodilator if possible.  We discussed this and answered  questions.   PLAN:  1.  Refill albuterol rescue inhaler 2 puffs q.i.d. p.r.n.  2.  Refill Foradil for 1 b.i.d.  when needed.  3.  Start Asmanex 1 puff daily.  4.  Schedule return in one year.                                   Clinton D. Maple Hudson, MD, FCCP, FACP   CDY/MedQ  DD:  01/22/2006  DT:  01/23/2006  Job #:  [id number]   cc:   Gloriajean Dell. Andrey Campanile, MD

## 2010-12-04 ENCOUNTER — Ambulatory Visit: Payer: Medicare Other | Admitting: Cardiovascular Disease

## 2010-12-05 ENCOUNTER — Encounter: Payer: Self-pay | Admitting: Cardiovascular Disease

## 2010-12-06 ENCOUNTER — Ambulatory Visit: Payer: Medicare Other | Admitting: Cardiovascular Disease

## 2010-12-07 ENCOUNTER — Ambulatory Visit (INDEPENDENT_AMBULATORY_CARE_PROVIDER_SITE_OTHER): Payer: Medicare Other | Admitting: Cardiovascular Disease

## 2010-12-07 ENCOUNTER — Encounter: Payer: Self-pay | Admitting: Cardiovascular Disease

## 2010-12-07 VITALS — BP 138/54 | HR 64 | Wt 197.0 lb

## 2010-12-07 DIAGNOSIS — R55 Syncope and collapse: Secondary | ICD-10-CM

## 2010-12-07 NOTE — Assessment & Plan Note (Signed)
She has not had any episodes of syncope since stopping the diltiazem. I feel sure that her sinus pauses were due to The Cardizem.  We will have her followup with her medical doctor and I'll see her on an as-needed basis. Her blood pressure is well controlled.

## 2010-12-07 NOTE — Progress Notes (Signed)
Crystal Hess Date of Birth  1929-09-04 [facility] [address]     [address] [place], [place]  [address] [phone]  Fax  [phone]  History of Present Illness:  Elderly female with a hx of HTN.  Had syncope on Diltiazem.  The diltiazem was stopped and she is feeling better.  She is avoiding salt and her BP is doing better.  Current Outpatient Prescriptions on File Prior to Visit  Medication Sig Dispense Refill  . albuterol (PROVENTIL) (2.5 MG/3ML) 0.083% nebulizer solution Take 2.5 mg by nebulization every 6 (six) hours as needed.        . beclomethasone (QVAR) 80 MCG/ACT inhaler Inhale 1 puff into the lungs 2 (two) times daily.        . diclofenac (VOLTAREN) 75 MG EC tablet Take 75 mg by mouth daily.        . formoterol (FORADIL) 12 MCG capsule for inhaler Place 12 mcg into inhaler and inhale 2 (two) times daily as needed.        . methimazole (TAPAZOLE) 10 MG tablet Take 10 mg by mouth daily.        . valsartan-hydrochlorothiazide (DIOVAN-HCT) 160-12.5 MG per tablet Take 1 tablet by mouth daily.          Allergies  Allergen Reactions  . Codeine     Past Medical History  Diagnosis Date  . Syncope and collapse   . Hypertension   . Asthma   . DOE (dyspnea on exertion)   . Mitral regurgitation   . Trace tricuspid regurgitation by prior echocardiogram     History reviewed. No pertinent past surgical history.  History  Smoking status  . Never Smoker   Smokeless tobacco  . Not on file    History  Alcohol Use No    Family History  Problem Relation Age of Onset  . Lymphoma Mother   . Lung cancer Father     Reviw of Systems:  Reviewed in the HPI.  All other systems are negative.  Physical Exam: BP 138/54  Pulse 64  Wt 197 lb (89.359 kg) The patient is alert and oriented x 3.  The mood and affect are normal.  The skin is warm and dry.  Color is normal.  The HEENT exam reveals that the sclera are nonicteric.   The mucous membranes are moist.  The carotids are 2+ without bruits.  There is no thyromegaly.  There is no JVD.  The lungs are clear.  The chest wall is non tender.  The heart exam reveals a regular rate with a normal S1 and S2.  There are no murmurs, gallops, or rubs.  The PMI is not displaced.   Abdominal exam reveals good bowel sounds.  There is no guarding or rebound.  There is no hepatosplenomegaly or tenderness.  There are no masses.  Exam of the legs reveal no clubbing, cyanosis, or edema.  The legs are without rashes.  The distal pulses are intact.  Cranial nerves II - XII are intact.  Motor and sensory functions are intact.  The gait is normal.  Assessment / Plan:

## 2011-01-10 ENCOUNTER — Other Ambulatory Visit: Payer: Self-pay | Admitting: Internal Medicine

## 2011-01-15 ENCOUNTER — Other Ambulatory Visit: Payer: Self-pay | Admitting: Internal Medicine

## 2011-01-15 MED ORDER — FORMOTEROL FUMARATE 12 MCG IN CAPS: 12.0000 ug | ORAL_CAPSULE | Freq: Two times a day (BID) | RESPIRATORY_TRACT | Status: DC

## 2011-06-12 ENCOUNTER — Telehealth: Payer: Self-pay | Admitting: Internal Medicine

## 2011-06-12 NOTE — Telephone Encounter (Signed)
Pt aware that per CDY she needs to keep appt with PCP, Dr. Benedetto Goad today for evaluation. Pt verbalized understanding.

## 2011-06-12 NOTE — Telephone Encounter (Signed)
Ok to see her PCP

## 2011-06-12 NOTE — Telephone Encounter (Signed)
Called and spoke with pt who advised has already called PCP Dr. Benedetto Goad and has an appointment scheduled at 5:40pm. Pt c/o productive cough with small amounts of clear mucus, green nasal drainage, nasal congestion, and feeling tired starting last night. Pt denies fever, body aches, n/v/d, chills, sweats, and PND. I advised pt I will forward message to CDY and if she does not hear back from [place] to keep appointment with PCP. Pt verbalized understanding. [facility]: [facility].   Allergies  Allergen Reactions  . Codeine

## 2011-06-13 ENCOUNTER — Other Ambulatory Visit: Payer: Self-pay | Admitting: Internal Medicine

## 2011-07-05 ENCOUNTER — Encounter: Payer: Self-pay | Admitting: Internal Medicine

## 2011-07-08 ENCOUNTER — Encounter: Payer: Self-pay | Admitting: Internal Medicine

## 2011-07-08 ENCOUNTER — Ambulatory Visit (INDEPENDENT_AMBULATORY_CARE_PROVIDER_SITE_OTHER): Payer: Medicare Other | Admitting: Internal Medicine

## 2011-07-08 VITALS — BP 138/78 | HR 88 | Ht 64.0 in | Wt 203.2 lb

## 2011-07-08 DIAGNOSIS — J45909 Unspecified asthma, uncomplicated: Secondary | ICD-10-CM

## 2011-07-08 DIAGNOSIS — J309 Allergic rhinitis, unspecified: Secondary | ICD-10-CM

## 2011-07-08 MED ORDER — METHYLPREDNISOLONE ACETATE 80 MG/ML IJ SUSP
80.0000 mg | Freq: Once | INTRAMUSCULAR | Status: AC
  Administered 2011-07-08: 80 mg via INTRAMUSCULAR

## 2011-07-08 MED ORDER — AZITHROMYCIN 250 MG PO TABS: ORAL_TABLET | ORAL | Status: AC

## 2011-07-08 MED ORDER — ALBUTEROL SULFATE HFA 108 (90 BASE) MCG/ACT IN AERS: 2.0000 | INHALATION_SPRAY | Freq: Four times a day (QID) | RESPIRATORY_TRACT | Status: DC | PRN

## 2011-07-08 NOTE — Patient Instructions (Signed)
Script to hold for Z pak antibiotic to fill as needed  Extra fluids, comfort issues like throat lozenges can help  Script sent for rescue albuterol inhaler to use as needed  Depo 80 today

## 2011-07-08 NOTE — Progress Notes (Signed)
07/08/11- 81 yoF never smoker followed for asthma, allergic rhinitis  PCP Dr Benedetto Goad LOV-02/02/2010 Has had flu vaccine. Recent acute bronchitis for which she saw her primary physician and was treated with Septra and prednisone. Those are completed 2 or 3 weeks ago but now she is catching a new cold. In the last day and a half she has had nasal congestion, scratchy throat and a sense that she is going downhill with this. Asthma is controlled. She denies fever, headache, wheeze.  ROS-see HPI Constitutional:   No-   weight loss, night sweats, fevers, chills, fatigue, lassitude. HEENT:   No-  headaches, difficulty swallowing, tooth/dental problems, sore throat,       No-  sneezing, itching, ear ache, +nasal congestion, post nasal drip,  CV:  No-   chest pain, orthopnea, PND, swelling in lower extremities, anasarca, dizziness, palpitations Resp: No-   shortness of breath with exertion or at rest.              No-   productive cough,  No non-productive cough,  No- coughing up of blood.              No-   change in color of mucus.  No- wheezing.   Skin: No-   rash or lesions. GI:  No-   heartburn, indigestion, abdominal pain, nausea, vomiting, diarrhea,                 change in bowel habits, loss of appetite GU: MS:  No-   joint pain or swelling.  No- decreased range of motion.  No- back pain. Neuro-     nothing unusual Psych:  No- change in mood or affect. No depression or anxiety.  No memory loss.  OBJ General- Alert, Oriented, Affect-appropriate, Distress- none acute Skin- rash-none, lesions- none, excoriation- none Lymphadenopathy- none Head- atraumatic            Eyes- Gross vision intact, PERRLA, conjunctivae clear secretions            Ears- Hearing, canals-normal            Nose- moderate nasal stuffiness without visible drainage, no-Septal dev, mucus, polyps, erosion, perforation             Throat- Mallampati III , mucosa clear , drainage- none, tonsils- atrophic Neck- flexible  , trachea midline, no stridor , thyroid nl, carotid no bruit Chest - symmetrical excursion , unlabored           Heart/CV- RRR , no murmur , no gallop  , no rub, nl s1 s2                           - JVD- none , edema- none, stasis changes- none, varices- none           Lung- clear to P&A, wheeze- none, + loose cough , dullness-none, rub- none           Chest wall-  Abd- Br/ Gen/ Rectal- Not done, not indicated Extrem- cyanosis- none, clubbing, none, atrophy- none, strength- nl Neuro- grossly intact to observation

## 2011-07-09 ENCOUNTER — Encounter: Payer: Self-pay | Admitting: Internal Medicine

## 2011-07-09 NOTE — Assessment & Plan Note (Addendum)
Controlled for now but with a developing viral infection typical of the upper respiratory infection/bronchitis syndrome is going to the area. She managed to resolve a bronchitis one month ago with Septra and prednisone. Plan-refill rescue inhaler, push fluids, Z-Pak to hold, Depo-Medrol

## 2011-11-26 ENCOUNTER — Other Ambulatory Visit: Payer: Self-pay | Admitting: *Deleted

## 2011-11-26 MED ORDER — BECLOMETHASONE DIPROPIONATE 80 MCG/ACT IN AERS: 1.0000 | INHALATION_SPRAY | Freq: Two times a day (BID) | RESPIRATORY_TRACT | Status: DC

## 2012-01-06 ENCOUNTER — Ambulatory Visit (INDEPENDENT_AMBULATORY_CARE_PROVIDER_SITE_OTHER): Payer: Medicare Other | Admitting: Internal Medicine

## 2012-01-06 ENCOUNTER — Encounter: Payer: Self-pay | Admitting: Internal Medicine

## 2012-01-06 VITALS — BP 122/70 | HR 79 | Ht 64.0 in | Wt 199.0 lb

## 2012-01-06 DIAGNOSIS — J45909 Unspecified asthma, uncomplicated: Secondary | ICD-10-CM

## 2012-01-06 DIAGNOSIS — J45998 Other asthma: Secondary | ICD-10-CM

## 2012-01-06 DIAGNOSIS — J309 Allergic rhinitis, unspecified: Secondary | ICD-10-CM

## 2012-01-06 NOTE — Progress Notes (Signed)
07/08/11- 81 yoF never smoker followed for asthma, allergic rhinitis  PCP Dr Benedetto Goad LOV-02/02/2010 Has had flu vaccine. Recent acute bronchitis for which she saw her primary physician and was treated with Septra and prednisone. Those are completed 2 or 3 weeks ago but now she is catching a new cold. In the last day and a half she has had nasal congestion, scratchy throat and a sense that she is going downhill with this. Asthma is controlled. She denies fever, headache, wheeze.  01/06/12- 81 yoF never smoker followed for asthma, allergic rhinitis  PCP Dr Benedetto Goad Doing well. She has been diagnosed with rheumatoid arthritis/Dr.Truslow and is facing right hip replacement. Rarely needs her rescue inhaler but continues daily Foradil and Qvar. She feels stable and comfortable with her breathing.  ROS-see HPI Constitutional:   No-   weight loss, night sweats, fevers, chills, fatigue, lassitude. HEENT:   No-  headaches, difficulty swallowing, tooth/dental problems, sore throat,       No-  sneezing, itching, ear ache, +nasal congestion, post nasal drip,  CV:  No-   chest pain, orthopnea, PND, swelling in lower extremities, anasarca, dizziness, palpitations Resp: No-   shortness of breath with exertion or at rest.              No-   productive cough,  No non-productive cough,  No- coughing up of blood.              No-   change in color of mucus.  No- wheezing.   Skin: No-   rash or lesions. GI:  No-   heartburn, indigestion, abdominal pain, nausea, vomiting, GU: MS:  +  joint pain or swelling.   Neuro-     nothing unusual Psych:  No- change in mood or affect. No depression or anxiety.  No memory loss.  OBJ General- Alert, Oriented, Affect-appropriate, Distress- none acute Skin- rash-none, lesions- none, excoriation- none Lymphadenopathy- none Head- atraumatic            Eyes- Gross vision intact, PERRLA, conjunctivae clear secretions            Ears- Hearing, canals-normal            Nose-  moderate nasal stuffiness without visible drainage, no-Septal dev, mucus, polyps, erosion, perforation             Throat- Mallampati III , mucosa clear , drainage- none, tonsils- atrophic Neck- flexible , trachea midline, no stridor , thyroid nl, carotid no bruit Chest - symmetrical excursion , unlabored           Heart/CV- RRR , no murmur , no gallop  , no rub, nl s1 s2                           - JVD- none , edema- none, stasis changes- none, varices- none           Lung- clear to P&A, wheeze- none, no- cough , dullness-none, rub- none           Chest wall-  Abd- Br/ Gen/ Rectal- Not done, not indicated Extrem- cyanosis- none, clubbing, none, atrophy- none, strength- nl Neuro- grossly intact to observation

## 2012-01-06 NOTE — Patient Instructions (Addendum)
Continue present meds. Please call as needed  As of today, no problems that need to be addressed before your hip surgery

## 2012-01-09 NOTE — Assessment & Plan Note (Signed)
Symptoms are well controlled 

## 2012-01-09 NOTE — Assessment & Plan Note (Signed)
Good control. She will continue Foradil and Qvar with no changes needed. We discussed use of rescue inhaler and we discussed management through surgery. I see no unusual problems that would affect the surgical decision or timing at this point.

## 2012-01-15 ENCOUNTER — Other Ambulatory Visit: Payer: Self-pay | Admitting: Internal Medicine

## 2012-02-04 ENCOUNTER — Other Ambulatory Visit: Payer: Self-pay | Admitting: Internal Medicine

## 2012-03-17 ENCOUNTER — Encounter: Payer: Self-pay | Admitting: Cardiovascular Disease

## 2012-07-09 ENCOUNTER — Ambulatory Visit: Payer: Medicare Other | Admitting: Internal Medicine

## 2012-07-31 ENCOUNTER — Encounter: Payer: Self-pay | Admitting: Internal Medicine

## 2012-07-31 ENCOUNTER — Ambulatory Visit (INDEPENDENT_AMBULATORY_CARE_PROVIDER_SITE_OTHER)
Admission: RE | Admit: 2012-07-31 | Discharge: 2012-07-31 | Disposition: A | Discharge status: Home / Self Care | Payer: Medicare Other | Source: Ambulatory Visit | Attending: Internal Medicine | Admitting: Internal Medicine

## 2012-07-31 ENCOUNTER — Ambulatory Visit (INDEPENDENT_AMBULATORY_CARE_PROVIDER_SITE_OTHER): Payer: Self-pay | Admitting: Internal Medicine

## 2012-07-31 VITALS — BP 142/60 | HR 66 | Ht 64.0 in | Wt 199.0 lb

## 2012-07-31 DIAGNOSIS — J45909 Unspecified asthma, uncomplicated: Secondary | ICD-10-CM

## 2012-07-31 DIAGNOSIS — J209 Acute bronchitis, unspecified: Secondary | ICD-10-CM

## 2012-07-31 MED ORDER — DOXYCYCLINE HYCLATE 100 MG PO TABS: ORAL_TABLET | ORAL | Status: AC

## 2012-07-31 NOTE — Progress Notes (Signed)
07/08/11- 81 yoF never smoker followed for asthma, allergic rhinitis  PCP Dr Benedetto Goad LOV-02/02/2010 Has had flu vaccine. Recent acute bronchitis for which she saw her primary physician and was treated with Septra and prednisone. Those are completed 2 or 3 weeks ago but now she is catching a new cold. In the last day and a half she has had nasal congestion, scratchy throat and a sense that she is going downhill with this. Asthma is controlled. She denies fever, headache, wheeze.  01/06/12- 81 yoF never smoker followed for asthma, allergic rhinitis  PCP Dr Benedetto Goad Doing well. She has been diagnosed with rheumatoid arthritis/Dr.Truslow and is facing right hip replacement. Rarely needs her rescue inhaler but continues daily Foradil and Qvar. She feels stable and comfortable with her breathing.  07/31/12- 82 yoF never smoker followed for asthma, allergic rhinitis , complicated by RA                   PCP Dr Benedetto Goad Follows For:  Sinus drainage (clear) - Sinus congestion - Non prod cough - SOB about the same - Denies fever. Acute head congestion for 2 days with now at the chest. Her doctors decided she did not need hip replacement after all.  ROS-see HPI Constitutional:   No-   weight loss, night sweats, fevers, chills, fatigue, lassitude. HEENT:   No-  headaches, difficulty swallowing, tooth/dental problems, sore throat,       No-  sneezing, itching, ear ache, +nasal congestion, post nasal drip,  CV:  No-   chest pain, orthopnea, PND, swelling in lower extremities, anasarca, dizziness, palpitations Resp: No-   shortness of breath with exertion or at rest.              No-   productive cough,  + non-productive cough,  No- coughing up of blood.              No-   change in color of mucus.  No- wheezing.   Skin: No-   rash or lesions. GI:  No-   heartburn, indigestion, abdominal pain, nausea, vomiting, GU: MS:  +  joint pain or swelling.   Neuro-     nothing unusual Psych:  No- change in  mood or affect. No depression or anxiety.  No memory loss.  OBJ General- Alert, Oriented, Affect-appropriate, Distress- none acute Skin- rash-none, lesions- none, excoriation- none Lymphadenopathy- none Head- atraumatic            Eyes- Gross vision intact, PERRLA, conjunctivae clear secretions            Ears- Hearing, canals-normal            Nose- + mucus bridging, no-Septal dev, mucus, polyps, erosion, perforation             Throat- Mallampati III-IV , mucosa clear , drainage- none, tonsils- atrophic Neck- flexible , trachea midline, no stridor , thyroid nl, carotid no bruit Chest - symmetrical excursion , unlabored           Heart/CV- RRR , no murmur , no gallop  , no rub, nl s1 s2                           - JVD- none , edema- none, stasis changes- none, varices- none           Lung- clear to P&A diminished, wheeze- none, no- cough , dullness-none, rub- none  Chest wall-  Abd- Br/ Gen/ Rectal- Not done, not indicated Extrem- cyanosis- none, clubbing, none, atrophy- none, strength- nl Neuro- grossly intact to observation

## 2012-07-31 NOTE — Patient Instructions (Addendum)
Order- CXR   Dx asthma  Script sent for doxycycline sent  Plenty of fluids and Mucinex may help  Please call as needed

## 2012-08-06 NOTE — Progress Notes (Signed)
Quick Note:  Pt aware of results. ______ 

## 2012-08-10 DIAGNOSIS — J209 Acute bronchitis, unspecified: Secondary | ICD-10-CM | POA: Insufficient documentation

## 2012-08-10 NOTE — Assessment & Plan Note (Signed)
Upper respiratory infection with bronchitis Plan-chest x-ray, doxycycline, fluids

## 2012-11-05 ENCOUNTER — Other Ambulatory Visit: Payer: Self-pay | Admitting: Internal Medicine

## 2013-02-08 ENCOUNTER — Other Ambulatory Visit: Payer: Self-pay | Admitting: Internal Medicine

## 2013-03-12 ENCOUNTER — Other Ambulatory Visit: Payer: Self-pay | Admitting: Internal Medicine

## 2013-03-16 ENCOUNTER — Other Ambulatory Visit: Payer: Self-pay | Admitting: Internal Medicine

## 2013-04-16 ENCOUNTER — Other Ambulatory Visit: Payer: Self-pay | Admitting: Internal Medicine

## 2013-05-16 ENCOUNTER — Other Ambulatory Visit: Payer: Self-pay | Admitting: Internal Medicine

## 2013-06-09 ENCOUNTER — Other Ambulatory Visit: Payer: Self-pay | Admitting: Internal Medicine

## 2013-06-15 ENCOUNTER — Other Ambulatory Visit: Payer: Self-pay | Admitting: Internal Medicine

## 2013-08-05 ENCOUNTER — Ambulatory Visit (INDEPENDENT_AMBULATORY_CARE_PROVIDER_SITE_OTHER): Payer: Medicare Other | Admitting: Internal Medicine

## 2013-08-05 ENCOUNTER — Encounter: Payer: Self-pay | Admitting: Internal Medicine

## 2013-08-05 VITALS — BP 150/80 | HR 65 | Ht 64.0 in | Wt 199.6 lb

## 2013-08-05 DIAGNOSIS — J45909 Unspecified asthma, uncomplicated: Secondary | ICD-10-CM

## 2013-08-05 DIAGNOSIS — J45998 Other asthma: Secondary | ICD-10-CM

## 2013-08-05 DIAGNOSIS — J309 Allergic rhinitis, unspecified: Secondary | ICD-10-CM

## 2013-08-05 NOTE — Progress Notes (Signed)
07/08/11- 81 yoF never smoker followed for asthma, allergic rhinitis  PCP Dr Benedetto Goad LOV-02/02/2010 Has had flu vaccine. Recent acute bronchitis for which she saw her primary physician and was treated with Septra and prednisone. Those are completed 2 or 3 weeks ago but now she is catching a new cold. In the last day and a half she has had nasal congestion, scratchy throat and a sense that she is going downhill with this. Asthma is controlled. She denies fever, headache, wheeze.  01/06/12- 81 yoF never smoker followed for asthma, allergic rhinitis  PCP Dr Benedetto Goad Doing well. She has been diagnosed with rheumatoid arthritis/Dr.Truslow and is facing right hip replacement. Rarely needs her rescue inhaler but continues daily Foradil and Qvar. She feels stable and comfortable with her breathing.  07/31/12- 82 yoF never smoker followed for asthma, allergic rhinitis , complicated by RA                   PCP Dr Benedetto Goad Follows For:  Sinus drainage (clear) - Sinus congestion - Non prod cough - SOB about the same - Denies fever. Acute head congestion for 2 days with now at the chest. Her doctors decided she did not need hip replacement after all.  08/05/13- 83 yoF never smoker followed for asthma, allergic rhinitis , complicated by RA  PCP Dr Benedetto Goad FOLLOWS FOR:  Breathing and cough doing well--no concerns today Brother died of COPD/smoker Notices dyspnea on exertion with longer walks. Little wheeze-Qvar helps CXR 08/06/12 IMPRESSION:  Stable chest x-ray. No active lung disease. 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  Original Report Authenticated By: Dwyane Dee, M.D.   ROS-see HPI Constitutional:   No-   weight loss, night sweats, fevers, chills, fatigue, lassitude. HEENT:   No-  headaches, difficulty swallowing, tooth/dental problems, sore throat,       No-  sneezing, itching, ear ache, +nasal congestion, post nasal drip,  CV:  No-   chest pain,  orthopnea, PND, swelling in lower extremities, anasarca, dizziness, palpitations Resp: + shortness of breath with exertion or at rest.              No-   productive cough,  + non-productive cough,  No- coughing up of blood.              No-   change in color of mucus.  No- wheezing.   Skin: No-   rash or lesions. GI:  No-   heartburn, indigestion, abdominal pain, nausea, vomiting, GU: MS:  +  joint pain or swelling.   Neuro-     nothing unusual Psych:  No- change in mood or affect. No depression or anxiety.  No memory loss.  OBJ General- Alert, Oriented, Affect-appropriate, Distress- none acute Skin- rash-none, lesions- none, excoriation- none Lymphadenopathy- none Head- atraumatic            Eyes- Gross vision intact, PERRLA, conjunctivae clear secretions            Ears- Hearing, canals-normal            Nose- + mucus bridging, no-Septal dev, mucus, polyps, erosion, perforation             Throat- Mallampati III-IV , mucosa clear , drainage- none, tonsils- atrophic Neck- flexible , trachea midline, no stridor , thyroid nl, carotid no bruit Chest - symmetrical excursion , unlabored           Heart/CV- RRR , no murmur ,  no gallop  , no rub, nl s1 s2                           - JVD- none , edema- none, stasis changes- none, varices- none           Lung- clear to P&A diminished, wheeze- none, no- cough , dullness-none, rub- none           Chest wall-  Abd- Br/ Gen/ Rectal- Not done, not indicated Extrem- cyanosis- none, clubbing, none, atrophy- none, strength- nl Neuro- grossly intact to observation

## 2013-08-05 NOTE — Patient Instructions (Signed)
We can continue present meds. Please call when you need our help, or med refills

## 2013-08-29 NOTE — Assessment & Plan Note (Signed)
Controlled with Qvar Plan-refill medications when called

## 2013-08-29 NOTE — Assessment & Plan Note (Signed)
Plan-hopefully she can manage with OTC antihistamines as spring comes

## 2013-08-30 ENCOUNTER — Other Ambulatory Visit: Payer: Self-pay | Admitting: Internal Medicine

## 2013-08-31 ENCOUNTER — Telehealth: Payer: Self-pay | Admitting: Internal Medicine

## 2013-08-31 ENCOUNTER — Other Ambulatory Visit: Payer: Self-pay | Admitting: Internal Medicine

## 2013-08-31 MED ORDER — BECLOMETHASONE DIPROPIONATE 80 MCG/ACT IN AERS: INHALATION_SPRAY | RESPIRATORY_TRACT | Status: DC

## 2013-08-31 NOTE — Telephone Encounter (Signed)
Rx sent electronically to pharmacy. 

## 2014-04-19 ENCOUNTER — Encounter (HOSPITAL_COMMUNITY): Payer: Self-pay | Admitting: Emergency Medicine

## 2014-04-19 ENCOUNTER — Emergency Department (HOSPITAL_COMMUNITY): Payer: Medicare Other

## 2014-04-19 ENCOUNTER — Emergency Department (HOSPITAL_COMMUNITY)
Admission: EM | Admit: 2014-04-19 | Discharge: 2014-04-19 | Disposition: A | Discharge status: Home / Self Care | Payer: Medicare Other | Attending: Emergency Medicine | Admitting: Emergency Medicine

## 2014-04-19 DIAGNOSIS — R55 Syncope and collapse: Secondary | ICD-10-CM | POA: Insufficient documentation

## 2014-04-19 DIAGNOSIS — R110 Nausea: Secondary | ICD-10-CM | POA: Diagnosis not present

## 2014-04-19 DIAGNOSIS — I10 Essential (primary) hypertension: Secondary | ICD-10-CM | POA: Insufficient documentation

## 2014-04-19 DIAGNOSIS — Z792 Long term (current) use of antibiotics: Secondary | ICD-10-CM | POA: Diagnosis not present

## 2014-04-19 DIAGNOSIS — Y838 Other surgical procedures as the cause of abnormal reaction of the patient, or of later complication, without mention of misadventure at the time of the procedure: Secondary | ICD-10-CM | POA: Diagnosis not present

## 2014-04-19 DIAGNOSIS — M10031 Idiopathic gout, right wrist: Secondary | ICD-10-CM

## 2014-04-19 DIAGNOSIS — J45909 Unspecified asthma, uncomplicated: Secondary | ICD-10-CM | POA: Insufficient documentation

## 2014-04-19 DIAGNOSIS — Z7901 Long term (current) use of anticoagulants: Secondary | ICD-10-CM | POA: Insufficient documentation

## 2014-04-19 DIAGNOSIS — Z7952 Long term (current) use of systemic steroids: Secondary | ICD-10-CM | POA: Diagnosis not present

## 2014-04-19 DIAGNOSIS — T814XXA Infection following a procedure, initial encounter: Secondary | ICD-10-CM | POA: Insufficient documentation

## 2014-04-19 DIAGNOSIS — R61 Generalized hyperhidrosis: Secondary | ICD-10-CM | POA: Insufficient documentation

## 2014-04-19 DIAGNOSIS — T798XXA Other early complications of trauma, initial encounter: Secondary | ICD-10-CM

## 2014-04-19 DIAGNOSIS — R42 Dizziness and giddiness: Secondary | ICD-10-CM | POA: Diagnosis present

## 2014-04-19 LAB — BASIC METABOLIC PANEL
Anion gap: 12 (ref 5–15)
BUN: 24 mg/dL — ABNORMAL HIGH (ref 6–23)
CO2: 26 mEq/L (ref 19–32)
Calcium: 9.9 mg/dL (ref 8.4–10.5)
Chloride: 101 mEq/L (ref 96–112)
Creatinine, Ser: 0.74 mg/dL (ref 0.50–1.10)
GFR calc Af Amer: >90 mL/min (ref >90)
GFR calc non Af Amer: 78 mL/min — ABNORMAL LOW (ref >90)
GLUCOSE: 168 mg/dL — AB (ref 70–99)
POTASSIUM: 4.4 meq/L (ref 3.7–5.3)
Sodium: 139 mEq/L (ref 137–147)

## 2014-04-19 LAB — CBC
HCT: 41.8 % (ref 36.0–46.0)
HEMOGLOBIN: 14.1 g/dL (ref 12.0–15.0)
MCH: 31.1 pg (ref 26.0–34.0)
MCHC: 33.7 g/dL (ref 30.0–36.0)
MCV: 92.1 fL (ref 78.0–100.0)
Platelets: 166 10*3/uL (ref 150–400)
RBC: 4.54 MIL/uL (ref 3.87–5.11)
RDW: 14.2 % (ref 11.5–15.5)
WBC: 7.9 10*3/uL (ref 4.0–10.5)

## 2014-04-19 LAB — I-STAT TROPONIN, ED: TROPONIN I, POC: 0 ng/mL (ref 0.00–0.08)

## 2014-04-19 MED ORDER — HYDROCODONE-ACETAMINOPHEN 5-325 MG PO TABS: 1.0000 | ORAL_TABLET | ORAL | Status: DC | PRN

## 2014-04-19 MED ORDER — DOXYCYCLINE HYCLATE 100 MG PO CAPS: 100.0000 mg | ORAL_CAPSULE | Freq: Two times a day (BID) | ORAL | Status: DC

## 2014-04-19 MED ORDER — DOXYCYCLINE HYCLATE 100 MG PO TABS
100.0000 mg | ORAL_TABLET | Freq: Once | ORAL | Status: AC
  Administered 2014-04-19: 100 mg via ORAL
  Filled 2014-04-19: qty 1

## 2014-04-19 MED ORDER — KETOROLAC TROMETHAMINE 60 MG/2ML IM SOLN
30.0000 mg | Freq: Once | INTRAMUSCULAR | Status: AC
  Administered 2014-04-19: 30 mg via INTRAMUSCULAR
  Filled 2014-04-19: qty 2

## 2014-04-19 NOTE — Discharge Instructions (Signed)
Gout Gout is an inflammatory arthritis caused by a buildup of uric acid crystals in the joints. Uric acid is a chemical that is normally present in the blood. When the level of uric acid in the blood is too high it can form crystals that deposit in your joints and tissues. This causes joint redness, soreness, and swelling (inflammation). Repeat attacks are common. Over time, uric acid crystals can form into masses (tophi) near a joint, destroying bone and causing disfigurement. Gout is treatable and often preventable. CAUSES  The disease begins with elevated levels of uric acid in the blood. Uric acid is produced by your body when it breaks down a naturally found substance called purines. Certain foods you eat, such as meats and fish, contain high amounts of purines. Causes of an elevated uric acid level include:  Being passed down from parent to child (heredity).  Diseases that cause increased uric acid production (such as obesity, psoriasis, and certain cancers).  Excessive alcohol use.  Diet, especially diets rich in meat and seafood.  Medicines, including certain cancer-fighting medicines (chemotherapy), water pills (diuretics), and aspirin.  Chronic kidney disease. The kidneys are no longer able to remove uric acid well.  Problems with metabolism. Conditions strongly associated with gout include:  Obesity.  High blood pressure.  High cholesterol.  Diabetes. Not everyone with elevated uric acid levels gets gout. It is not understood why some people get gout and others do not. Surgery, joint injury, and eating too much of certain foods are some of the factors that can lead to gout attacks. SYMPTOMS   An attack of gout comes on quickly. It causes intense pain with redness, swelling, and warmth in a joint.  Fever can occur.  Often, only one joint is involved. Certain joints are more commonly involved:  Base of the big toe.  Knee.  Ankle.  Wrist.  Finger. Without  treatment, an attack usually goes away in a few days to weeks. Between attacks, you usually will not have symptoms, which is different from many other forms of arthritis. DIAGNOSIS  Your caregiver will suspect gout based on your symptoms and exam. In some cases, tests may be recommended. The tests may include:  Blood tests.  Urine tests.  X-rays.  Joint fluid exam. This exam requires a needle to remove fluid from the joint (arthrocentesis). Using a microscope, gout is confirmed when uric acid crystals are seen in the joint fluid. TREATMENT  There are two phases to gout treatment: treating the sudden onset (acute) attack and preventing attacks (prophylaxis).  Treatment of an Acute Attack.  Medicines are used. These include anti-inflammatory medicines or steroid medicines.  An injection of steroid medicine into the affected joint is sometimes necessary.  The painful joint is rested. Movement can worsen the arthritis.  You may use warm or cold treatments on painful joints, depending which works best for you.  Treatment to Prevent Attacks.  If you suffer from frequent gout attacks, your caregiver may advise preventive medicine. These medicines are started after the acute attack subsides. These medicines either help your kidneys eliminate uric acid from your body or decrease your uric acid production. You may need to stay on these medicines for a very long time.  The early phase of treatment with preventive medicine can be associated with an increase in acute gout attacks. For this reason, during the first few months of treatment, your caregiver may also advise you to take medicines usually used for acute gout treatment. Be sure you  understand your caregiver's directions. Your caregiver may make several adjustments to your medicine dose before these medicines are effective.  Discuss dietary treatment with your caregiver or dietitian. Alcohol and drinks high in sugar and fructose and foods  such as meat, poultry, and seafood can increase uric acid levels. Your caregiver or dietitian can advise you on drinks and foods that should be limited. HOME CARE INSTRUCTIONS   Do not take aspirin to relieve pain. This raises uric acid levels.  Only take over-the-counter or prescription medicines for pain, discomfort, or fever as directed by your caregiver.  Rest the joint as much as possible. When in bed, keep sheets and blankets off painful areas.  Keep the affected joint raised (elevated).  Apply warm or cold treatments to painful joints. Use of warm or cold treatments depends on which works best for you.  Use crutches if the painful joint is in your leg.  Drink enough fluids to keep your urine clear or pale yellow. This helps your body get rid of uric acid. Limit alcohol, sugary drinks, and fructose drinks.  Follow your dietary instructions. Pay careful attention to the amount of protein you eat. Your daily diet should emphasize fruits, vegetables, whole grains, and fat-free or low-fat milk products. Discuss the use of coffee, vitamin C, and cherries with your caregiver or dietitian. These may be helpful in lowering uric acid levels.  Maintain a healthy body weight. SEEK MEDICAL CARE IF:   You develop diarrhea, vomiting, or any side effects from medicines.  You do not feel better in 24 hours, or you are getting worse. SEEK IMMEDIATE MEDICAL CARE IF:   Your joint becomes suddenly more tender, and you have chills or a fever. MAKE SURE YOU:   Understand these instructions.  Will watch your condition.  Will get help right away if you are not doing well or get worse. Document Released: 06/21/2000 Document Revised: 11/08/2013 Document Reviewed: 02/05/2012 [facility] Patient Information 2015 [place], [place]. This information is not intended to replace advice given to you by your health care provider. Make sure you discuss any questions you have with your health care  provider.  Near-Syncope Near-syncope (commonly known as near fainting) is sudden weakness, dizziness, or feeling like you might pass out. During an episode of near-syncope, you may also develop pale skin, have tunnel vision, or feel sick to your stomach (nauseous). Near-syncope may occur when getting up after sitting or while standing for a long time. It is caused by a sudden decrease in blood flow to the brain. This decrease can result from various causes or triggers, most of which are not serious. However, because near-syncope can sometimes be a sign of something serious, a medical evaluation is required. The specific cause is often not determined. HOME CARE INSTRUCTIONS  Monitor your condition for any changes. The following actions may help to alleviate any discomfort you are experiencing:  Have someone stay with you until you feel stable.  Lie down right away and prop your feet up if you start feeling like you might faint. Breathe deeply and steadily. Wait until all the symptoms have passed. Most of these episodes last only a few minutes. You may feel tired for several hours.   Drink enough fluids to keep your urine clear or pale yellow.   If you are taking blood pressure or heart medicine, get up slowly when seated or lying down. Take several minutes to sit and then stand. This can reduce dizziness.  Follow up with your health  care provider as directed. SEEK IMMEDIATE MEDICAL CARE IF:   You have a severe headache.   You have unusual pain in the chest, abdomen, or back.   You are bleeding from the mouth or rectum, or you have black or tarry stool.   You have an irregular or very fast heartbeat.   You have repeated fainting or have seizure-like jerking during an episode.   You faint when sitting or lying down.   You have confusion.   You have difficulty walking.   You have severe weakness.   You have vision problems.  MAKE SURE YOU:   Understand these  instructions.  Will watch your condition.  Will get help right away if you are not doing well or get worse. Document Released: 06/24/2005 Document Revised: 06/29/2013 Document Reviewed: 11/27/2012 [facility] Patient Information 2015 [place]ExitCare, [place]LL[place]. This information is not intended to replace advice given to you by your health care provider. Make sure you discuss any questions you have with your health care provider.

## 2014-04-19 NOTE — ED Notes (Signed)
Pt in via EMS from the chiropractor, went there due to right wrist pain and while there she was noted to have dizziness upon standing and nausea, symptoms seen to have resolved at this time. Pt denies pain other than wrist. Pt had a stress test completed yesterday, pt unsure for reason of test. Alert and oriented, no distress noted

## 2014-04-19 NOTE — ED Notes (Signed)
Pt ambulating independently w/ steady gait on d/c in no acute distress, A&Ox4. D/c instructions reviewed w/ pt and family - pt and family deny any further questions or concerns at present. Rx given x2  

## 2014-04-19 NOTE — ED Provider Notes (Signed)
CSN: [id number]     Arrival date & time 04/19/14  1727 History   First MD Initiated Contact with Patient 04/19/14 1742     Chief Complaint  Patient presents with  . Nausea  . Dizziness     (Consider location/radiation/quality/duration/timing/severity/associated sxs/prior Treatment) HPI Comments: Patient presents with right wrist pain. She does have a history of gout and has had gout before in her hands but none in her wrist. She woke up at 4:00 this morning with pain in the right wrist and it's been worsening throughout the day. It's worse with movement. She denies any falls or recent injuries. She denies any numbness in the hand. She hasn't noticed any significant swelling to the hand. She was at her chiropractor today the [facility] with wrist pain and while she was sitting down at a chiropractors office, she had an episode of lightheadedness with associated nausea and diaphoresis. She states that she felt that for about 10 minutes. She states they checked her blood pressure was 103 systolic which is lower than it normally is. She feels completely back to normal now. She only complains of right wrist pain currently. She denies any palpitations preceding the episode. She denies any exertional symptoms. She does have a history of a syncopal episode related to diltiazem in the past but she's currently off the diltiazem. She was recently diagnosed with atrial fibrillation. She is on Xarelto for the atrial fibrillation. She states that she is taking it regularly. She denies any headache. She denies any numbness or weakness in her extremities. She denies any fevers cough or recent illnesses.  Patient is a 80 y.o. female presenting with dizziness.  Dizziness Associated symptoms: nausea   Associated symptoms: no blood in stool, no chest pain, no diarrhea, no headaches, no shortness of breath and no vomiting     Past Medical History  Diagnosis Date  . Syncope and collapse   . Hypertension   . Asthma    . DOE (dyspnea on exertion)   . Mitral regurgitation   . Trace tricuspid regurgitation by prior echocardiogram    Past Surgical History  Procedure Laterality Date  . Hemorrhoid surgery    . Ovarian cyst removal    . Shoulder surgery      left, bone spur  . Knee cartilage surgery      right  . Tonsillectomy     Family History  Problem Relation Age of Onset  . Lymphoma Mother   . Lung cancer Father     smoker  . Emphysema Brother    History  Substance Use Topics  . Smoking status: Never Smoker   . Smokeless tobacco: Not on file  . Alcohol Use: No   OB History   Grav Para Term Preterm Abortions TAB SAB Ect Mult Living                 Review of Systems  Constitutional: Positive for diaphoresis. Negative for fever, chills and fatigue.  HENT: Negative for congestion, rhinorrhea and sneezing.   Eyes: Negative.   Respiratory: Negative for cough, chest tightness and shortness of breath.   Cardiovascular: Negative for chest pain and leg swelling.  Gastrointestinal: Positive for nausea. Negative for vomiting, abdominal pain, diarrhea and blood in stool.  Genitourinary: Negative for frequency, hematuria, flank pain and difficulty urinating.  Musculoskeletal: Positive for arthralgias. Negative for back pain.  Skin: Negative for rash.  Neurological: Positive for dizziness and light-headedness. Negative for speech difficulty, weakness, numbness and headaches.  Allergies  Codeine  Home Medications   Prior to Admission medications   Medication Sig Start Date End Date Taking? Authorizing Provider  beclomethasone (QVAR) 80 MCG/ACT inhaler INHALE 1 PUFF BY MOUTH TWICE DAILY 08/31/13  Yes Waymon Budgelinton D Young, MD  Calcium Carbonate-Vit D-Min (CALCIUM 600 + MINERALS) 600-200 MG-UNIT TABS Take 1 tablet by mouth daily.   Yes Historical Provider, MD  Fexofenadine HCl (ALLEGRA ALLERGY PO) Take by mouth daily.    Yes Historical Provider, MD  FORADIL AEROLIZER 12 MCG capsule for inhaler  INAHLE CONTENTS OF CAPSULE TWICE DAILY 08/30/13  Yes Waymon Budgelinton D Young, MD  Omega-3 Fatty Acids (FISH OIL) 600 MG CAPS Take 1 capsule by mouth daily.   Yes Historical Provider, MD  PROVENTIL HFA 108 (90 BASE) MCG/ACT inhaler INHALE 2 PUFFS INTO LUNGS EVERY 6 HOURS AS NEEDED FOR WHEEZING AND SHORTNESS OF BREATH 11/05/12  Yes Waymon Budgelinton D Young, MD  rivaroxaban (XARELTO) 20 MG TABS tablet Take 20 mg by mouth daily.   Yes Historical Provider, MD  valsartan-hydrochlorothiazide (DIOVAN-HCT) 160-12.5 MG per tablet Take 1 tablet by mouth daily.     Yes Historical Provider, MD  doxycycline (VIBRAMYCIN) 100 MG capsule Take 1 capsule (100 mg total) by mouth 2 (two) times daily. One po bid x 7 days 04/19/14   Rolan BuccoMelanie Lyndel Sarate, MD  HYDROcodone-acetaminophen (NORCO/VICODIN) 5-325 MG per tablet Take 1-2 tablets by mouth every 4 (four) hours as needed. 04/19/14   Rolan BuccoMelanie Sumayyah Custodio, MD   BP 165/65  Pulse 67  Temp(Src) 97.6 F (36.4 C) (Oral)  Resp 20  SpO2 99% Physical Exam  Constitutional: She is oriented to person, place, and time. She appears well-developed and well-nourished.  HENT:  Head: Normocephalic and atraumatic.  Eyes: Pupils are equal, round, and reactive to light.  Neck: Normal range of motion. Neck supple.  Cardiovascular: Normal rate, regular rhythm and normal heart sounds.   Pulmonary/Chest: Effort normal and breath sounds normal. No respiratory distress. She has no wheezes. She has no rales. She exhibits no tenderness.  Abdominal: Soft. Bowel sounds are normal. There is no tenderness. There is no rebound and no guarding.  Musculoskeletal: Normal range of motion. She exhibits no edema.  She has trace swelling to the right wrist with diffuse tenderness around the wrist. There is no erythema noted. There is mild warmth to the area. She has normal sensation motor function distally. She has no pain with palpation or range of motion of the fingers. She has no pain to the elbow. Radial pulses intact.   Lymphadenopathy:    She has no cervical adenopathy.  Neurological: She is alert and oriented to person, place, and time.  Skin: Skin is warm and dry. No rash noted.  Psychiatric: She has a normal mood and affect.    ED Course  Procedures (including critical care time) Labs Review Results for orders placed during the hospital encounter of 04/19/14  CBC      Result Value Ref Range   WBC 7.9  4.0 - 10.5 K/uL   RBC 4.54  3.87 - 5.11 MIL/uL   Hemoglobin 14.1  12.0 - 15.0 g/dL   HCT 82.941.8  56.236.0 - 13.046.0 %   MCV 92.1  78.0 - 100.0 fL   MCH 31.1  26.0 - 34.0 pg   MCHC 33.7  30.0 - 36.0 g/dL   RDW 86.514.2  78.411.5 - 69.615.5 %   Platelets 166  150 - 400 K/uL  BASIC METABOLIC PANEL      Result Value  Ref Range   Sodium 139  137 - 147 mEq/L   Potassium 4.4  3.7 - 5.3 mEq/L   Chloride 101  96 - 112 mEq/L   CO2 26  19 - 32 mEq/L   Glucose, Bld 168 (*) 70 - 99 mg/dL   BUN 24 (*) 6 - 23 mg/dL   Creatinine, Ser 2.95  0.50 - 1.10 mg/dL   Calcium 9.9  8.4 - 62.1 mg/dL   GFR calc non Af Amer 78 (*) >90 mL/min   GFR calc Af Amer >90  >90 mL/min   Anion gap 12  5 - 15  I-STAT TROPOININ, ED      Result Value Ref Range   Troponin i, poc 0.00  0.00 - 0.08 ng/mL   Comment 3            Dg Chest 2 View  04/19/2014   CLINICAL DATA:  One day history of nausea and dizziness  EXAM: CHEST  2 VIEW  COMPARISON:  July 31, 2012  FINDINGS: There is no edema or consolidation. Heart is enlarged with pulmonary vascularity within normal limits. There is no appreciable adenopathy. There is atherosclerotic change in the aorta. No bone lesions. There is slight rightward deviation of the upper thoracic trachea, stable.  IMPRESSION: Stable cardiac enlargement. No edema or consolidation. Probable enlargement of the left thyroid given the persistent mild tracheal deviation.   Electronically Signed   By: Bretta Bang M.D.   On: 04/19/2014 18:50   Dg Wrist Complete Right  04/19/2014   CLINICAL DATA:  One day history of  right wrist pain. No history of trauma  EXAM: RIGHT WRIST - COMPLETE 3+ VIEW  COMPARISON:  Right hand January 10, 2010  FINDINGS: Frontal, oblique, lateral, and ulnar deviation scaphoid images were obtained. Bones are osteoporotic. There is no acute fracture or dislocation. There is fairly severe osteoarthritic change in the saddle joint. There is milder osteoarthritic change in the radiocarpal and scaphotrapezial joint regions, as well as in all MCP joints. There is fairly significant osteoarthritic change in the first IP joint. There is no erosive change. There is mild calcification in the triangular fibrocartilage region.  IMPRESSION: Areas of osteoarthritic change, most marked in the saddle joint. Bones are osteoporotic. No acute fracture dislocation. Calcification in the triangular fibrocartilage region could be indicative of prior tear in this region.   Electronically Signed   By: Bretta Bang M.D.   On: 04/19/2014 20:29      Imaging Review Dg Chest 2 View  04/19/2014   CLINICAL DATA:  One day history of nausea and dizziness  EXAM: CHEST  2 VIEW  COMPARISON:  July 31, 2012  FINDINGS: There is no edema or consolidation. Heart is enlarged with pulmonary vascularity within normal limits. There is no appreciable adenopathy. There is atherosclerotic change in the aorta. No bone lesions. There is slight rightward deviation of the upper thoracic trachea, stable.  IMPRESSION: Stable cardiac enlargement. No edema or consolidation. Probable enlargement of the left thyroid given the persistent mild tracheal deviation.   Electronically Signed   By: Bretta Bang M.D.   On: 04/19/2014 18:50   Dg Wrist Complete Right  04/19/2014   CLINICAL DATA:  One day history of right wrist pain. No history of trauma  EXAM: RIGHT WRIST - COMPLETE 3+ VIEW  COMPARISON:  Right hand January 10, 2010  FINDINGS: Frontal, oblique, lateral, and ulnar deviation scaphoid images were obtained. Bones are osteoporotic. There is no  acute fracture  or dislocation. There is fairly severe osteoarthritic change in the saddle joint. There is milder osteoarthritic change in the radiocarpal and scaphotrapezial joint regions, as well as in all MCP joints. There is fairly significant osteoarthritic change in the first IP joint. There is no erosive change. There is mild calcification in the triangular fibrocartilage region.  IMPRESSION: Areas of osteoarthritic change, most marked in the saddle joint. Bones are osteoporotic. No acute fracture dislocation. Calcification in the triangular fibrocartilage region could be indicative of prior tear in this region.   Electronically Signed   By: Bretta Bang M.D.   On: 04/19/2014 20:29     EKG Interpretation None      Date: 04/19/2014  Rate: 62  Rhythm: atrial fibrillation  QRS Axis: normal  Intervals: normal  ST/T Wave abnormalities: nonspecific ST/T changes  Conduction Disutrbances:none  Narrative Interpretation:   Old EKG Reviewed: none available   MDM   Final diagnoses:  Acute idiopathic gout of right wrist  Wound infection, initial encounter  Near syncope    Patient presents primarily with wrist pain. She does have some minor inflammation of the joint but no appreciable joint effusion. She does have a history of gout in the hands toward the wrist and she says the pain feels similar to her past episodes of gout. There some warmth to the area but no other indications for septic arthritis.  She cannot take ongoing NSAIDs given that she's on Xarelto. I did give her one dose of Toradol in the ED and she is feeling better after this. I gave her a prescription for Vicodin to use at home for the pain. She's been taking tramadol and doesn't seem to have therapy for this. Also advised in ice and elevation. She has a partial toenail removal to her left big toe following ingrown toenail. There some minor erythema around the area and some odor to the wound. There some mild drainage from the  wound. I will go ahead and start her on doxycycline for this. I encouraged her to have close followup with her primary care physician to recheck both her wrist and her toe.  Her second presenting complaint is a near syncopal episode. She currently feels much better and has had no recurrence of her symptoms. She had no preceding palpitations, chest pain or shortness of breath. She had a similar episode in August and she was walking up a hill in the heat. At that point she did have the sensation that her heart was racing. She sees Dr. Sherril Croon with [facility] cardiology. She had a stress test done yesterday but did not know the results. She's scheduled to have an echocardiogram on October 22. She has no ischemic changes on EKG. She does appear to be in atrial fibrillation with a controlled rate. Her troponin is negative. She was ambulated in the ED without symptoms.  I spoke with Dr. Jacinto Halim who is on call for Dr. Sherril Croon.  He felt that pt can go home.  He reviewed findings of her nuclear stress which were normal.  He might set her up for an event monitor.    Rolan Bucco, MD 04/19/14 2226

## 2014-07-26 ENCOUNTER — Encounter (HOSPITAL_COMMUNITY)
Admission: RE | Disposition: A | Discharge status: Home / Self Care | Payer: Self-pay | Source: Ambulatory Visit | Attending: Cardiology

## 2014-07-26 ENCOUNTER — Ambulatory Visit (HOSPITAL_COMMUNITY)
Admission: RE | Admit: 2014-07-26 | Discharge: 2014-07-26 | Disposition: A | Discharge status: Home / Self Care | Payer: Medicare Other | Source: Ambulatory Visit | Attending: Cardiology | Admitting: Cardiology

## 2014-07-26 ENCOUNTER — Encounter (HOSPITAL_COMMUNITY): Payer: Self-pay | Admitting: *Deleted

## 2014-07-26 ENCOUNTER — Ambulatory Visit (HOSPITAL_COMMUNITY): Payer: Medicare Other | Admitting: Certified Registered Nurse Anesthetist

## 2014-07-26 DIAGNOSIS — I10 Essential (primary) hypertension: Secondary | ICD-10-CM | POA: Diagnosis not present

## 2014-07-26 DIAGNOSIS — I4891 Unspecified atrial fibrillation: Secondary | ICD-10-CM | POA: Insufficient documentation

## 2014-07-26 DIAGNOSIS — J45909 Unspecified asthma, uncomplicated: Secondary | ICD-10-CM | POA: Diagnosis not present

## 2014-07-26 HISTORY — PX: CARDIOVERSION: SHX1299

## 2014-07-26 SURGERY — CARDIOVERSION
Anesthesia: General

## 2014-07-26 MED ORDER — SODIUM CHLORIDE 0.9 % IV SOLN
INTRAVENOUS | Status: DC
  Administered 2014-07-26: 500 mL via INTRAVENOUS

## 2014-07-26 NOTE — Discharge Instructions (Signed)

## 2014-07-26 NOTE — H&P (Signed)
  Please see office visit notes for complete details of HPI.  

## 2014-07-26 NOTE — CV Procedure (Signed)
Direct current cardioversion:  Indication symptomatic A. Fibrillation.  Procedure: Using 60 mg of IV Propofol and 80 IV Lidocaine (for reducing venous pain) for achieving deep sedation, synchronized direct current cardioversion performed. Patient was delivered with 100 Joules of electricity X 1 then 120J x 1 with success to S. Bradycardia with paroxysmal episodes of sinus arrest with escape atrial ectopic rhythm. Patient tolerated the procedure well. No immediate complication noted.

## 2014-07-26 NOTE — Anesthesia Postprocedure Evaluation (Signed)
  Anesthesia Post-op Note  Patient: Crystal Hess  Procedure(s) Performed: Procedure(s): CARDIOVERSION (N/A)  Patient Location: Endoscopy Unit  Anesthesia Type:General  Level of Consciousness: awake, alert  and oriented  Airway and Oxygen Therapy: Patient Spontanous Breathing and Patient connected to nasal cannula oxygen  Post-op Pain: none  Post-op Assessment: Post-op Vital signs reviewed, Patient's Cardiovascular Status Stable, Respiratory Function Stable, Patent Airway and No signs of Nausea or vomiting  Post-op Vital Signs: Reviewed and stable  Last Vitals:  Filed Vitals:   07/26/14 1359  BP:   Pulse: 65  Temp:   Resp: 25    Complications: No apparent anesthesia complications

## 2014-07-26 NOTE — Anesthesia Preprocedure Evaluation (Addendum)
Anesthesia Evaluation  Patient identified by MRN, date of birth, ID band Patient awake    Reviewed: Allergy & Precautions, NPO status , Patient's Chart, lab work & pertinent test results, reviewed documented beta blocker date and time   Airway Mallampati: I       Dental  (+) Teeth Intact   Pulmonary shortness of breath and with exertion, asthma ,          Cardiovascular hypertension, Pt. on medications     Neuro/Psych    GI/Hepatic   Endo/Other    Renal/GU      Musculoskeletal   Abdominal   Peds  Hematology   Anesthesia Other Findings   Reproductive/Obstetrics                            Anesthesia Physical Anesthesia Plan  ASA: III  Anesthesia Plan: General   Post-op Pain Management:    Induction: Intravenous  Airway Management Planned: Mask  Additional Equipment:   Intra-op Plan:   Post-operative Plan:   Informed Consent: I have reviewed the patients History and Physical, chart, labs and discussed the procedure including the risks, benefits and alternatives for the proposed anesthesia with the patient or authorized representative who has indicated his/her understanding and acceptance.   Dental advisory given  Plan Discussed with: CRNA and Anesthesiologist  Anesthesia Plan Comments:        Anesthesia Quick Evaluation

## 2014-07-26 NOTE — Transfer of Care (Signed)
Immediate Anesthesia Transfer of Care Note  Patient: Crystal Hess  Procedure(s) Performed: Procedure(s): CARDIOVERSION (N/A)  Patient Location: Endoscopy Unit  Anesthesia Type:General  Level of Consciousness: awake, alert  and oriented  Airway & Oxygen Therapy: Patient Spontanous Breathing and Patient connected to nasal cannula oxygen  Post-op Assessment: Report given to PACU RN, Post -op Vital signs reviewed and stable and Patient moving all extremities X 4  Post vital signs: Reviewed and stable  Complications: No apparent anesthesia complications

## 2014-07-26 NOTE — Anesthesia Procedure Notes (Signed)
Procedure Name: MAC Date/Time: 07/26/2014 1:45 PM Performed by: Quentin OreWALKER, Ryu Cerreta E Pre-anesthesia Checklist: Patient identified, Emergency Drugs available, Suction available, Timeout performed and Patient being monitored Patient Re-evaluated:Patient Re-evaluated prior to inductionOxygen Delivery Method: Ambu bag Preoxygenation: Pre-oxygenation with 100% oxygen Intubation Type: IV induction Ventilation: Mask ventilation without difficulty

## 2014-07-26 NOTE — Interval H&P Note (Signed)
History and Physical Interval Note:  07/26/2014 1:43 PM  Crystal Hess  has presented today for surgery, with the diagnosis of AFIB  The various methods of treatment have been discussed with the patient and family. After consideration of risks, benefits and other options for treatment, the patient has consented to  Procedure(s): CARDIOVERSION (N/A) as a surgical intervention .  The patient's history has been reviewed, patient examined, no change in status, stable for surgery.  I have reviewed the patient's chart and labs.  Questions were answered to the patient's satisfaction.     Pamella PertGANJI,JAGADEESH R

## 2014-07-27 ENCOUNTER — Encounter (HOSPITAL_COMMUNITY): Payer: Self-pay | Admitting: Cardiology

## 2014-08-05 ENCOUNTER — Ambulatory Visit: Payer: Medicare Other | Admitting: Internal Medicine

## 2014-08-06 ENCOUNTER — Other Ambulatory Visit: Payer: Self-pay | Admitting: Internal Medicine

## 2014-08-12 ENCOUNTER — Ambulatory Visit: Payer: Medicare Other | Admitting: Internal Medicine

## 2014-08-30 ENCOUNTER — Ambulatory Visit (HOSPITAL_COMMUNITY): Payer: Medicare Other | Admitting: Anesthesiology

## 2014-08-30 ENCOUNTER — Ambulatory Visit (HOSPITAL_COMMUNITY)
Admission: RE | Admit: 2014-08-30 | Discharge: 2014-08-30 | Disposition: A | Discharge status: Home / Self Care | Payer: Medicare Other | Source: Ambulatory Visit | Attending: Cardiology | Admitting: Cardiology

## 2014-08-30 ENCOUNTER — Encounter (HOSPITAL_COMMUNITY)
Admission: RE | Disposition: A | Discharge status: Home / Self Care | Payer: Self-pay | Source: Ambulatory Visit | Attending: Cardiology

## 2014-08-30 ENCOUNTER — Encounter (HOSPITAL_COMMUNITY): Payer: Self-pay | Admitting: *Deleted

## 2014-08-30 DIAGNOSIS — I10 Essential (primary) hypertension: Secondary | ICD-10-CM | POA: Insufficient documentation

## 2014-08-30 DIAGNOSIS — I4891 Unspecified atrial fibrillation: Secondary | ICD-10-CM | POA: Insufficient documentation

## 2014-08-30 DIAGNOSIS — E669 Obesity, unspecified: Secondary | ICD-10-CM | POA: Diagnosis not present

## 2014-08-30 DIAGNOSIS — I440 Atrioventricular block, first degree: Secondary | ICD-10-CM | POA: Insufficient documentation

## 2014-08-30 DIAGNOSIS — Z6831 Body mass index (BMI) 31.0-31.9, adult: Secondary | ICD-10-CM | POA: Diagnosis not present

## 2014-08-30 DIAGNOSIS — Z79899 Other long term (current) drug therapy: Secondary | ICD-10-CM | POA: Diagnosis not present

## 2014-08-30 DIAGNOSIS — I491 Atrial premature depolarization: Secondary | ICD-10-CM | POA: Insufficient documentation

## 2014-08-30 DIAGNOSIS — J45909 Unspecified asthma, uncomplicated: Secondary | ICD-10-CM | POA: Diagnosis not present

## 2014-08-30 HISTORY — PX: CARDIOVERSION: SHX1299

## 2014-08-30 SURGERY — CARDIOVERSION
Anesthesia: General

## 2014-08-30 MED ORDER — PROMETHAZINE HCL 25 MG/ML IJ SOLN: 6.2500 mg | INTRAMUSCULAR | Status: DC | PRN

## 2014-08-30 MED ORDER — LIDOCAINE HCL (PF) 1 % IJ SOLN
INTRAMUSCULAR | Status: DC | PRN
  Administered 2014-08-30: 30 mg

## 2014-08-30 MED ORDER — SODIUM CHLORIDE 0.9 % IJ SOLN: 3.0000 mL | Freq: Two times a day (BID) | INTRAMUSCULAR | Status: DC

## 2014-08-30 MED ORDER — SODIUM CHLORIDE 0.9 % IJ SOLN: 3.0000 mL | INTRAMUSCULAR | Status: DC | PRN

## 2014-08-30 MED ORDER — SODIUM CHLORIDE 0.9 % IV SOLN
INTRAVENOUS | Status: DC | PRN
  Administered 2014-08-30: 12:00:00 via INTRAVENOUS

## 2014-08-30 MED ORDER — MEPERIDINE HCL 100 MG/ML IJ SOLN
6.2500 mg | INTRAMUSCULAR | Status: DC | PRN
Start: 2014-08-30 — End: 2014-08-30

## 2014-08-30 MED ORDER — SODIUM CHLORIDE 0.9 % IV SOLN
250.0000 mL | INTRAVENOUS | Status: DC
  Administered 2014-08-30: 250 mL via INTRAVENOUS

## 2014-08-30 MED ORDER — PROPOFOL 10 MG/ML IV BOLUS
INTRAVENOUS | Status: DC | PRN
  Administered 2014-08-30: 60 mg via INTRAVENOUS

## 2014-08-30 MED ORDER — HYDROCORTISONE 1 % EX CREA
1.0000 "application " | TOPICAL_CREAM | Freq: Three times a day (TID) | CUTANEOUS | Status: DC | PRN
  Filled 2014-08-30: qty 28

## 2014-08-30 NOTE — Discharge Instructions (Signed)

## 2014-08-30 NOTE — Anesthesia Preprocedure Evaluation (Addendum)
Anesthesia Evaluation  Patient identified by MRN, date of birth, ID band Patient awake    Reviewed: Allergy & Precautions, NPO status , Patient's Chart, lab work & pertinent test results, reviewed documented beta blocker date and time   Airway Mallampati: I       Dental  (+) Teeth Intact   Pulmonary neg pulmonary ROS, shortness of breath and with exertion, asthma ,  breath sounds clear to auscultation        Cardiovascular hypertension, Pt. on medications + DOE + dysrhythmias Atrial Fibrillation Rhythm:Irregular     Neuro/Psych negative neurological ROS  negative psych ROS   GI/Hepatic negative GI ROS, Neg liver ROS,   Endo/Other  negative endocrine ROS  Renal/GU negative Renal ROS     Musculoskeletal negative musculoskeletal ROS (+)   Abdominal (+) + obese,   Peds  Hematology negative hematology ROS (+)   Anesthesia Other Findings   Reproductive/Obstetrics negative OB ROS                            Anesthesia Physical  Anesthesia Plan  ASA: III  Anesthesia Plan: MAC   Post-op Pain Management:    Induction: Intravenous  Airway Management Planned: Mask  Additional Equipment:   Intra-op Plan:   Post-operative Plan:   Informed Consent: I have reviewed the patients History and Physical, chart, labs and discussed the procedure including the risks, benefits and alternatives for the proposed anesthesia with the patient or authorized representative who has indicated his/her understanding and acceptance.   Dental advisory given  Plan Discussed with: CRNA  Anesthesia Plan Comments:        Anesthesia Quick Evaluation

## 2014-08-30 NOTE — Transfer of Care (Signed)
Immediate Anesthesia Transfer of Care Note  Patient: Crystal RipperFrances I Loppnow  Procedure(s) Performed: Procedure(s): CARDIOVERSION (N/A)  Patient Location: PACU  Anesthesia Type:General  Level of Consciousness: awake, alert , oriented and patient cooperative  Airway & Oxygen Therapy: Patient Spontanous Breathing and Patient connected to nasal cannula oxygen  Post-op Assessment: Report given to RN and Post -op Vital signs reviewed and stable  Post vital signs: Reviewed and stable  Last Vitals:  Filed Vitals:   08/30/14 1245  BP: 133/61  Pulse: 70  Temp:   Resp: 23    Complications: No apparent anesthesia complications

## 2014-08-30 NOTE — Anesthesia Postprocedure Evaluation (Signed)
  Anesthesia Post-op Note  Patient: Alan RipperFrances I Caridi  Procedure(s) Performed: Procedure(s): CARDIOVERSION (N/A)  Patient Location: PACU and Endoscopy Unit  Anesthesia Type:General  Level of Consciousness: awake, alert , oriented and patient cooperative  Airway and Oxygen Therapy: Patient Spontanous Breathing  Post-op Pain: none  Post-op Assessment: Post-op Vital signs reviewed, Patient's Cardiovascular Status Stable, Respiratory Function Stable, Patent Airway, No signs of Nausea or vomiting, Adequate PO intake, Pain level controlled, No headache, No backache, No residual numbness and No residual motor weakness  Post-op Vital Signs: Reviewed and stable  Last Vitals:  Filed Vitals:   08/30/14 1245  BP: 133/61  Pulse: 70  Temp:   Resp: 23    Complications: No apparent anesthesia complications

## 2014-08-30 NOTE — Interval H&P Note (Signed)
History and Physical Interval Note:  08/30/2014 11:36 AM  Crystal Hess  has presented today for surgery, with the diagnosis of afib  The various methods of treatment have been discussed with the patient and family. After consideration of risks, benefits and other options for treatment, the patient has consented to  Procedure(s): CARDIOVERSION (N/A) as a surgical intervention .  The patient's history has been reviewed, patient examined, no change in status, stable for surgery.  I have reviewed the patient's chart and labs.  Questions were answered to the patient's satisfaction.     Pamella PertGANJI,JAGADEESH R

## 2014-08-30 NOTE — H&P (Signed)
  Please see office visit notes for complete details of HPI.  

## 2014-08-30 NOTE — CV Procedure (Signed)
Direct current cardioversion:  Indication symptomatic A. Fibrillation.  Procedure: Using 60 mg of IV Propofol and 30 IV Lidocaine (for reducing venous pain) for achieving deep sedation, synchronized direct current cardioversion performed. Patient was delivered with 100 Joules of electricity X 1 with success to Sinus bradycardia with 1st degree AV Block and occasional PAC. Patient tolerated the procedure well. No immediate complication noted.

## 2014-08-31 ENCOUNTER — Encounter (HOSPITAL_COMMUNITY): Payer: Self-pay | Admitting: Cardiology

## 2014-09-19 ENCOUNTER — Emergency Department (HOSPITAL_COMMUNITY)
Admission: EM | Admit: 2014-09-19 | Discharge: 2014-09-19 | Disposition: A | Discharge status: Home / Self Care | Payer: Medicare Other | Attending: Emergency Medicine | Admitting: Emergency Medicine

## 2014-09-19 ENCOUNTER — Emergency Department (HOSPITAL_COMMUNITY): Payer: Medicare Other

## 2014-09-19 DIAGNOSIS — M1711 Unilateral primary osteoarthritis, right knee: Secondary | ICD-10-CM | POA: Insufficient documentation

## 2014-09-19 DIAGNOSIS — I10 Essential (primary) hypertension: Secondary | ICD-10-CM | POA: Diagnosis not present

## 2014-09-19 DIAGNOSIS — M19042 Primary osteoarthritis, left hand: Secondary | ICD-10-CM | POA: Diagnosis not present

## 2014-09-19 DIAGNOSIS — Z79899 Other long term (current) drug therapy: Secondary | ICD-10-CM | POA: Insufficient documentation

## 2014-09-19 DIAGNOSIS — M79661 Pain in right lower leg: Secondary | ICD-10-CM | POA: Diagnosis present

## 2014-09-19 DIAGNOSIS — J45909 Unspecified asthma, uncomplicated: Secondary | ICD-10-CM | POA: Insufficient documentation

## 2014-09-19 DIAGNOSIS — M130 Polyarthritis, unspecified: Secondary | ICD-10-CM

## 2014-09-19 DIAGNOSIS — Z7951 Long term (current) use of inhaled steroids: Secondary | ICD-10-CM | POA: Diagnosis not present

## 2014-09-19 LAB — BASIC METABOLIC PANEL
Anion gap: 9 (ref 5–15)
BUN: 17 mg/dL (ref 6–23)
CHLORIDE: 101 mmol/L (ref 96–112)
CO2: 23 mmol/L (ref 19–32)
CREATININE: 0.51 mg/dL (ref 0.50–1.10)
Calcium: 9.6 mg/dL (ref 8.4–10.5)
GFR calc Af Amer: >90 mL/min (ref >90)
GFR calc non Af Amer: 87 mL/min — ABNORMAL LOW (ref >90)
Glucose, Bld: 166 mg/dL — ABNORMAL HIGH (ref 70–99)
Potassium: 3.7 mmol/L (ref 3.5–5.1)
Sodium: 133 mmol/L — ABNORMAL LOW (ref 135–145)

## 2014-09-19 LAB — CBC WITH DIFFERENTIAL/PLATELET
Basophils Absolute: 0 10*3/uL (ref 0.0–0.1)
Basophils Relative: 0 % (ref 0–1)
EOS PCT: 0 % (ref 0–5)
Eosinophils Absolute: 0 10*3/uL (ref 0.0–0.7)
HCT: 43.4 % (ref 36.0–46.0)
Hemoglobin: 14.5 g/dL (ref 12.0–15.0)
LYMPHS ABS: 2.3 10*3/uL (ref 0.7–4.0)
Lymphocytes Relative: 21 % (ref 12–46)
MCH: 30.6 pg (ref 26.0–34.0)
MCHC: 33.4 g/dL (ref 30.0–36.0)
MCV: 91.6 fL (ref 78.0–100.0)
Monocytes Absolute: 1.1 10*3/uL — ABNORMAL HIGH (ref 0.1–1.0)
Monocytes Relative: 10 % (ref 3–12)
Neutro Abs: 7.7 10*3/uL (ref 1.7–7.7)
Neutrophils Relative %: 69 % (ref 43–77)
PLATELETS: 165 10*3/uL (ref 150–400)
RBC: 4.74 MIL/uL (ref 3.87–5.11)
RDW: 13.9 % (ref 11.5–15.5)
WBC: 11.1 10*3/uL — ABNORMAL HIGH (ref 4.0–10.5)

## 2014-09-19 LAB — URIC ACID: URIC ACID, SERUM: 5.6 mg/dL (ref 2.4–7.0)

## 2014-09-19 MED ORDER — ONDANSETRON HCL 4 MG/2ML IJ SOLN
4.0000 mg | Freq: Once | INTRAMUSCULAR | Status: AC
  Administered 2014-09-19: 4 mg via INTRAVENOUS
  Filled 2014-09-19: qty 2

## 2014-09-19 MED ORDER — SODIUM CHLORIDE 0.9 % IV SOLN
INTRAVENOUS | Status: DC
  Administered 2014-09-19: 22:00:00 via INTRAVENOUS

## 2014-09-19 MED ORDER — OXYCODONE-ACETAMINOPHEN 5-325 MG PO TABS: 1.0000 | ORAL_TABLET | Freq: Four times a day (QID) | ORAL | Status: DC | PRN

## 2014-09-19 MED ORDER — HYDROMORPHONE HCL 1 MG/ML IJ SOLN
0.5000 mg | INTRAMUSCULAR | Status: DC | PRN
  Administered 2014-09-19 (×2): 0.5 mg via INTRAVENOUS
  Filled 2014-09-19 (×2): qty 1

## 2014-09-19 NOTE — ED Notes (Signed)
Patient c/o right leg pain r/t gout flare up. Patient denies taking home medication for gout. Patient states the pain has been ongoing for about 3 days. Denies injury, fall.

## 2014-09-19 NOTE — Discharge Instructions (Signed)
Arthritis, Nonspecific °Arthritis is inflammation of a joint. This usually means pain, redness, warmth or swelling are present. One or more joints may be involved. There are a number of types of arthritis. Your caregiver may not be able to tell what type of arthritis you have right away. °CAUSES  °The most common cause of arthritis is the wear and tear on the joint (osteoarthritis). This causes damage to the cartilage, which can break down over time. The knees, hips, back and neck are most often affected by this type of arthritis. °Other types of arthritis and common causes of joint pain include: °· Sprains and other injuries near the joint. Sometimes minor sprains and injuries cause pain and swelling that develop hours later. °· Rheumatoid arthritis. This affects hands, feet and knees. It usually affects both sides of your body at the same time. It is often associated with chronic ailments, fever, weight loss and general weakness. °· Crystal arthritis. Gout and pseudo gout can cause occasional acute severe pain, redness and swelling in the foot, ankle, or knee. °· Infectious arthritis. Bacteria can get into a joint through a break in overlying skin. This can cause infection of the joint. Bacteria and viruses can also spread through the blood and affect your joints. °· Drug, infectious and allergy reactions. Sometimes joints can become mildly painful and slightly swollen with these types of illnesses. °SYMPTOMS  °· Pain is the main symptom. °· Your joint or joints can also be red, swollen and warm or hot to the touch. °· You may have a fever with certain types of arthritis, or even feel overall ill. °· The joint with arthritis will hurt with movement. Stiffness is present with some types of arthritis. °DIAGNOSIS  °Your caregiver will suspect arthritis based on your description of your symptoms and on your exam. Testing may be needed to find the type of arthritis: °· Blood and sometimes urine tests. °· X-ray tests  and sometimes CT or MRI scans. °· Removal of fluid from the joint (arthrocentesis) is done to check for bacteria, crystals or other causes. Your caregiver (or a specialist) will numb the area over the joint with a local anesthetic, and use a needle to remove joint fluid for examination. This procedure is only minimally uncomfortable. °· Even with these tests, your caregiver may not be able to tell what kind of arthritis you have. Consultation with a specialist (rheumatologist) may be helpful. °TREATMENT  °Your caregiver will discuss with you treatment specific to your type of arthritis. If the specific type cannot be determined, then the following general recommendations may apply. °Treatment of severe joint pain includes: °· Rest. °· Elevation. °· Anti-inflammatory medication (for example, ibuprofen) may be prescribed. Avoiding activities that cause increased pain. °· Only take over-the-counter or prescription medicines for pain and discomfort as recommended by your caregiver. °· Cold packs over an inflamed joint may be used for 10 to 15 minutes every hour. Hot packs sometimes feel better, but do not use overnight. Do not use hot packs if you are diabetic without your caregiver's permission. °· A cortisone shot into arthritic joints may help reduce pain and swelling. °· Any acute arthritis that gets worse over the next 1 to 2 days needs to be looked at to be sure there is no joint infection. °Long-term arthritis treatment involves modifying activities and lifestyle to reduce joint stress jarring. This can include weight loss. Also, exercise is needed to nourish the joint cartilage and remove waste. This helps keep the muscles   around the joint strong. °HOME CARE INSTRUCTIONS  °· Do not take aspirin to relieve pain if gout is suspected. This elevates uric acid levels. °· Only take over-the-counter or prescription medicines for pain, discomfort or fever as directed by your caregiver. °· Rest the joint as much as  possible. °· If your joint is swollen, keep it elevated. °· Use crutches if the painful joint is in your leg. °· Drinking plenty of fluids may help for certain types of arthritis. °· Follow your caregiver's dietary instructions. °· Try low-impact exercise such as: °¨ Swimming. °¨ Water aerobics. °¨ Biking. °¨ Walking. °· Morning stiffness is often relieved by a warm shower. °· Put your joints through regular range-of-motion. °SEEK MEDICAL CARE IF:  °· You do not feel better in 24 hours or are getting worse. °· You have side effects to medications, or are not getting better with treatment. °SEEK IMMEDIATE MEDICAL CARE IF:  °· You have a fever. °· You develop severe joint pain, swelling or redness. °· Many joints are involved and become painful and swollen. °· There is severe back pain and/or leg weakness. °· You have loss of bowel or bladder control. °Document Released: 08/01/2004 Document Revised: 09/16/2011 Document Reviewed: 08/17/2008 °Exit[employer]® Patient Information ©2015 [employer]. This information is not intended to replace advice given to you by your health care provider. Make sure you discuss any questions you have with your health care provider. ° °

## 2014-09-19 NOTE — ED Provider Notes (Signed)
CSN: [id number]     Arrival date & time 09/19/14  1521 History   First MD Initiated Contact with Patient 09/19/14 2025     Chief Complaint  Patient presents with  . Leg Pain    HPI Pt started having pain in her right knee s well as her left hand.  This started on Saturday.  It has increased in severity.   The pain increases with movement.  No fevers.  She has noticed some redness around her left eye as well.  No chills.  No vomiting or diarrhea.  She has history of gout in the past and wonders if the same thing is going on. Past Medical History  Diagnosis Date  . Syncope and collapse   . Hypertension   . Asthma   . DOE (dyspnea on exertion)   . Mitral regurgitation   . Trace tricuspid regurgitation by prior echocardiogram    Past Surgical History  Procedure Laterality Date  . Hemorrhoid surgery    . Ovarian cyst removal    . Shoulder surgery      left, bone spur  . Knee cartilage surgery      right  . Tonsillectomy    . Cardioversion N/A 07/26/2014    Procedure: CARDIOVERSION;  Surgeon: Pamella Pert, MD;  Location: [facility] ENDOSCOPY;  Service: Cardiovascular;  Laterality: N/A;  . Cardioversion N/A 08/30/2014    Procedure: CARDIOVERSION;  Surgeon: Pamella Pert, MD;  Location: [facility] ENDOSCOPY;  Service: Cardiovascular;  Laterality: N/A;   Family History  Problem Relation Age of Onset  . Lymphoma Mother   . Lung cancer Father     smoker  . Emphysema Brother    History  Substance Use Topics  . Smoking status: Never Smoker   . Smokeless tobacco: Not on file  . Alcohol Use: No   OB History    No data available     Review of Systems  All other systems reviewed and are negative.     Allergies  Codeine and Methotrexate derivatives  Home Medications   Prior to Admission medications   Medication Sig Start Date End Date Taking? Authorizing Provider  Calcium Carbonate-Vit D-Min (CALCIUM 600 + MINERALS) 600-200 MG-UNIT TABS Take 1 tablet by mouth daily.   Yes  Historical Provider, MD  ELIQUIS 5 MG TABS tablet Take 5 mg by mouth 2 (two) times daily. 08/12/14  Yes Historical Provider, MD  Fexofenadine HCl (ALLEGRA ALLERGY PO) Take 1 tablet by mouth daily.    Yes Historical Provider, MD  methimazole (TAPAZOLE) 5 MG tablet Take 5 mg by mouth at bedtime.  06/13/14  Yes Historical Provider, MD  Omega-3 Fatty Acids (FISH OIL) 600 MG CAPS Take 1 capsule by mouth daily.   Yes Historical Provider, MD  valsartan-hydrochlorothiazide (DIOVAN-HCT) 160-12.5 MG per tablet Take 1 tablet by mouth daily.     Yes Historical Provider, MD  beclomethasone (QVAR) 80 MCG/ACT inhaler INHALE 1 PUFF BY MOUTH TWICE DAILY Patient not taking: Reported on 09/19/2014 08/31/13   Waymon Budge, MD  FORADIL AEROLIZER 12 MCG capsule for inhaler INHALE CONTENTS OF CAPSULE TWICE DAILY Patient taking differently: INHALE CONTENTS OF CAPSULE TWICE DAILY AS NEEDED 08/09/14   Waymon Budge, MD  oxyCODONE-acetaminophen (PERCOCET/ROXICET) 5-325 MG per tablet Take 1-2 tablets by mouth every 6 (six) hours as needed. 09/19/14   Linwood Dibbles, MD  PROVENTIL HFA 108 (90 BASE) MCG/ACT inhaler INHALE 2 PUFFS INTO LUNGS EVERY 6 HOURS AS NEEDED FOR WHEEZING AND SHORTNESS OF  BREATH 11/05/12   Waymon Budgelinton D Young, MD   BP 147/67 mmHg  Pulse 63  Temp(Src) 98.8 F (37.1 C) (Oral)  Resp 14  SpO2 96% Physical Exam  Constitutional: She appears well-developed and well-nourished. No distress.  HENT:  Head: Normocephalic and atraumatic.  Right Ear: External ear normal.  Left Ear: External ear normal.  Eyes: Conjunctivae are normal. Right eye exhibits no discharge. Left eye exhibits no discharge. No scleral icterus.  Neck: Neck supple. No tracheal deviation present.  Cardiovascular: Normal rate, regular rhythm and intact distal pulses.   Pulmonary/Chest: Effort normal and breath sounds normal. No stridor. No respiratory distress. She has no wheezes. She has no rales.  Abdominal: Soft. Bowel sounds are normal. She exhibits  no distension. There is no tenderness. There is no rebound and no guarding.  Musculoskeletal: She exhibits no edema.       Right knee: She exhibits decreased range of motion, swelling and effusion. Tenderness found.       Left hand: She exhibits tenderness.  Erythema of the pip joint left hand,    Neurological: She is alert. She has normal strength. No cranial nerve deficit (no facial droop, extraocular movements intact, no slurred speech) or sensory deficit. She exhibits normal muscle tone. She displays no seizure activity. Coordination normal.  Skin: Skin is warm and dry. No rash noted.  Psychiatric: She has a normal mood and affect.  Nursing note and vitals reviewed.   ED Course  Procedures (including critical care time) Labs Review Labs Reviewed  CBC WITH DIFFERENTIAL/PLATELET - Abnormal; Notable for the following:    WBC 11.1 (*)    Monocytes Absolute 1.1 (*)    All other components within normal limits  BASIC METABOLIC PANEL - Abnormal; Notable for the following:    Sodium 133 (*)    Glucose, Bld 166 (*)    GFR calc non Af Amer 87 (*)    All other components within normal limits  URIC ACID    Imaging Review Dg Knee Complete 4 Views Right  09/19/2014   CLINICAL DATA:  pain and swelling x1 week without trauma  EXAM: RIGHT KNEE - COMPLETE 4+ VIEW  COMPARISON:  12/12/2005  FINDINGS: Narrowing of the articular cartilage in the medial and lateral compartments with proximally 1 cm lateral subluxation of the tibial plateau with respect to the femoral condyles. Small marginal spurs are noted about the all 3 compartments. There is a moderate effusion in the suprapatellar bursa. Negative for fracture or dislocation. Mild diffuse osteopenia.  IMPRESSION: 1. Negative for fracture or other acute bone abnormality. 2. Tricompartmental degenerative changes as above, with effusion and subluxation.   Electronically Signed   By: Corlis Leak  Hassell M.D.   On: 09/19/2014 16:31     MDM   Final diagnoses:   Polyarthritis    Pt has degenerative changes on her knee xray.  This may be the source of her knee pain.  She also has pain and inflammation in her finger.  I doubt acute infection.    She has a history of arthritis and she used to be on methotrexate.  She has seen a rheumatologist in the past.  I discussed the findings with patient and her son.  Will dc home on pain medications.  Follow up with her rheumatologist    Linwood DibblesJon Danuel Felicetti, MD 09/19/14 2315

## 2014-09-19 NOTE — ED Notes (Signed)
MD at bedside. 

## 2014-09-19 NOTE — ED Notes (Signed)
Bed: [facility] Expected date:  Expected time:  Means of arrival:  Comments: 84 f right leg pain

## 2014-09-19 NOTE — ED Notes (Addendum)
Pt states that she has had R knee has been painful since Saturday night.Alert and oriented. Ambulatory w/ assistance.

## 2014-10-04 ENCOUNTER — Other Ambulatory Visit: Payer: Self-pay

## 2014-10-10 ENCOUNTER — Encounter: Payer: Self-pay | Admitting: Internal Medicine

## 2014-10-10 ENCOUNTER — Other Ambulatory Visit: Payer: Self-pay

## 2014-10-10 ENCOUNTER — Ambulatory Visit (INDEPENDENT_AMBULATORY_CARE_PROVIDER_SITE_OTHER): Payer: Medicare Other | Admitting: Internal Medicine

## 2014-10-10 VITALS — BP 124/62 | HR 95 | Ht 63.5 in | Wt 169.2 lb

## 2014-10-10 DIAGNOSIS — R0683 Snoring: Secondary | ICD-10-CM

## 2014-10-10 DIAGNOSIS — I10 Essential (primary) hypertension: Secondary | ICD-10-CM

## 2014-10-10 DIAGNOSIS — I4819 Other persistent atrial fibrillation: Secondary | ICD-10-CM | POA: Insufficient documentation

## 2014-10-10 DIAGNOSIS — I4891 Unspecified atrial fibrillation: Secondary | ICD-10-CM | POA: Diagnosis not present

## 2014-10-10 DIAGNOSIS — I481 Persistent atrial fibrillation: Secondary | ICD-10-CM | POA: Diagnosis not present

## 2014-10-10 NOTE — Patient Instructions (Signed)
Audrie LiaSally Earl, Pharm D will call you in regards to setting up Tikos[facility] admission    She will tell you when to stop your HCTZ

## 2014-10-10 NOTE — Progress Notes (Signed)
Electrophysiology Office Note   Date:  10/10/2014   ID:  Crystal Hess, DOB 03-28-1930, MRN [id number]  PCP:  Pamelia HoitWILSON,FRED HENRY, MD  Cardiologist:  Dr Jacinto HalimGanji Primary Electrophysiologist: Hillis RangeJames Nakyla Bracco, MD    Chief Complaint  Patient presents with  . Atrial Fibrillation     History of Present Illness: Crystal RipperFrances I Hess is a 81 y.o. female who presents today for electrophysiology evaluation.   She presents for evaluation of atrial fibrillation.  She reports initially being diagnosed with atrial fibrillation 9/15 after presenting to Dr Julien NordmannWilsons office for a routine physical exam.  She was unaware of afib at the time.  In retrospect, she has had exertional fatigue for 3-4 years.  She was referred to Dr Jacinto HalimGanji and she was initiated on xarelto.  She did not tolerate this medicine (attributes gout and feeling "sick" to xarelto).  She was switched to eliquis which she has tolerated.  She underwent cardioversion 07/26/14.  She quickly returned to atrial fibrillation.  She thinks that she did have a little more energy in sinus rhythm.  She was placed on flecainide and underwent repeat cardioversion 08/30/14.  She again returned to atrial fibrillation within about a week.  She has been persistently in afib since that time.l  She remains active.  Her primary limitation is due to knee pain and gout.  She wants to have L knee surgery soon.  She has SOB with moderate activity. She has occasional ankle swelling at night.   Over the past few days, she has had URI symptoms with cough.  This is not chronic. Today, she denies symptoms of palpitations, chest pain,  orthopnea, PND, claudication, dizziness, presyncope, recent syncope, bleeding, or neurologic sequela. The patient is tolerating medications without difficulties and is otherwise without complaint today.    Past Medical History  Diagnosis Date  . Hypertension   . Asthma   . DOE (dyspnea on exertion)     wears O2 at night but has not had a sleep  study  . Mitral regurgitation     mild  . Trace tricuspid regurgitation by prior echocardiogram   . Persistent atrial fibrillation   . Obesity   . DJD (degenerative joint disease)   . Hyperthyroidism    Past Surgical History  Procedure Laterality Date  . Hemorrhoid surgery    . Ovarian cyst removal    . Shoulder surgery      left, bone spur  . Knee cartilage surgery      right  . Tonsillectomy    . Cardioversion N/A 07/26/2014    Procedure: CARDIOVERSION;  Surgeon: Pamella PertJagadeesh R Ganji, MD;  [facility];  Service: Cardiovascular;  Laterality: N/A;  . Cardioversion N/A 08/30/2014    Procedure: CARDIOVERSION;  Surgeon: Pamella PertJagadeesh R Ganji, MD;  [facility];  Service: Cardiovascular;  Laterality: N/A;     Current Outpatient Prescriptions  Medication Sig Dispense Refill  . beclomethasone (QVAR) 80 MCG/ACT inhaler INHALE 1 PUFF BY MOUTH TWICE DAILY (Patient taking differently: INHALE 1 PUFF INTO THE LUNGS TWICE DAILY) 8.7 g 6  . Calcium Carbonate-Vit D-Min (CALCIUM 600 + MINERALS) 600-200 MG-UNIT TABS Take 1 tablet by mouth daily.    Marland Kitchen. ELIQUIS 5 MG TABS tablet Take 5 mg by mouth 2 (two) times daily.  5  . Fexofenadine HCl (ALLEGRA ALLERGY PO) Take 1 tablet by mouth daily.     Marland Kitchen. FORADIL AEROLIZER 12 MCG capsule for inhaler INHALE CONTENTS OF CAPSULE TWICE DAILY (Patient taking differently: INHALE CONTENTS  OF CAPSULE TWICE DAILY AS NEEDED FOR SHORTNESS OF BREATH OR WHEEZING) 60 capsule 0  . methimazole (TAPAZOLE) 5 MG tablet Take 5 mg by mouth at bedtime.   2  . Omega-3 Fatty Acids (FISH OIL) 600 MG CAPS Take 1 capsule by mouth daily.    Marland Kitchen PROVENTIL HFA 108 (90 BASE) MCG/ACT inhaler INHALE 2 PUFFS INTO LUNGS EVERY 6 HOURS AS NEEDED FOR WHEEZING AND SHORTNESS OF BREATH 6.7 g 2  . valsartan-hydrochlorothiazide (DIOVAN-HCT) 160-12.5 MG per tablet Take 1 tablet by mouth daily.      . fluticasone (FLONASE) 50 MCG/ACT nasal spray Place 2 sprays into both nostrils daily as  needed. ALLERGIES  1   No current facility-administered medications for this visit.    Allergies:   Codeine and Methotrexate derivatives   Social History:  The patient  reports that she has never smoked. She does not have any smokeless tobacco history on file. She reports that she does not drink alcohol or use illicit drugs.   Family History:  The patient's  family history includes Emphysema in her brother; Lung cancer in her father; Lymphoma in her mother.    ROS:  Please see the history of present illness.   All other systems are reviewed and negative.    PHYSICAL EXAM: VS:  BP 124/62 mmHg  Pulse 95  Ht 5' 3.5" (1.613 m)  Wt 169 lb 3.2 oz (76.749 kg)  BMI 29.50 kg/m2 , BMI Body mass index is 29.5 kg/(m^2). GEN: overweight, appears younger than stated age, in no acute distress HEENT: normal Neck: no JVD, carotid bruits, or masses Cardiac: iRRR; no murmurs, rubs, or gallops,no edema  Respiratory:  clear to auscultation bilaterally, normal work of breathing GI: soft, nontender, nondistended, + BS MS: no deformity or atrophy Skin: warm and dry  Neuro:  Strength and sensation are intact Psych: euthymic mood, full affect  EKG:  EKG is ordered today. The ekg ordered today shows afib, V rate 95 bpm, QRS 76, Qtc 424, otherwise normal ekg   Recent Labs: 09/19/2014: BUN 17; Creatinine 0.51; Hemoglobin 14.5; Platelets 165; Potassium 3.7; Sodium 133*    Lipid Panel  No results found for: CHOL, TRIG, HDL, CHOLHDL, VLDL, LDLCALC, LDLDIRECT   Wt Readings from Last 3 Encounters:  10/10/14 169 lb 3.2 oz (76.749 kg)  08/30/14 178 lb (80.74 kg)  07/26/14 180 lb (81.647 kg)      Other studies Reviewed: Additional studies/ records that were reviewed today include: 8  Review of the above records today demonstrates: echo 04/18/14- EF 55%, mildly dilated LA, mild MR, mild to mod PI leiscan 04/18/14 reveals normal perfusion   ASSESSMENT AND PLAN:  1.  Persistent atrial  fibrillation Per Dr Jacinto Halim, atrial flutter has also been documented though I do not have these tracings to review She has minimally symptomatic persistent atrial fibrillation.  She has failed medical therapy with flecainide.  She has mild LA dilitation (though I do not have atrial size) per echo 10/15.   Given advanced age and persistence of afib, I would advise medical therapy going forward.  Her anticipated success rates with ablation would be 55-60% and risks are probably a little higher. Therapeutic strategies for afib including long term rate control and rhythm control were discussed in detail with the patient today.  She would like to further consider rhythm control.  Our best option is tikosyn.  Given prior sinus bradycardia, I would avoid sotalol.  Could consider amiodarone however with hyperthyroid I would like  to avoid this. She is willing to consider tikosyn.  Will therefore refer to the tikosyn clinic for initiation.  Will need to stop hctz before hand.   chads2vasc score is at least 4.  Continue long term anticoagulation.  She is doing well with eliquis.  2. htn Stable No change required today  3. Snoring/ O2 requirement at night Will need sleep study arranged. Will defer to Dr Jacinto Halim  4. Obesity Lifestyle modification would be helpful  5. preop Ok to proceed with knee surgery Would be very reasonable to rate control and proceed with surgery prior to further attempts at sinus rhythm She is aware that if we start tikosyn/ cardiovert that she will require 30 days of uninterrupted anticoagulation prior to surgery.   Current medicines are reviewed at length with the patient today.   The patient does not have concerns regarding her medicines.  The following changes were made today:  none  Follow-up: return to see me 4 weeks after tikosyn load   Signed, Hillis Range, MD  10/10/2014 9:15 AM     [facility] [address] [place]  [address] [phone] (office) [phone] (fax)

## 2014-10-17 ENCOUNTER — Ambulatory Visit: Payer: Medicare Other | Admitting: Pharmacist

## 2014-10-20 ENCOUNTER — Ambulatory Visit (INDEPENDENT_AMBULATORY_CARE_PROVIDER_SITE_OTHER): Payer: Medicare Other | Admitting: Pharmacist

## 2014-10-20 DIAGNOSIS — I481 Persistent atrial fibrillation: Secondary | ICD-10-CM

## 2014-10-20 DIAGNOSIS — I4819 Other persistent atrial fibrillation: Secondary | ICD-10-CM

## 2014-10-20 LAB — BASIC METABOLIC PANEL
BUN: 17 mg/dL (ref 6–23)
CALCIUM: 10 mg/dL (ref 8.4–10.5)
CO2: 27 meq/L (ref 19–32)
Chloride: 107 mEq/L (ref 96–112)
Creatinine, Ser: 0.57 mg/dL (ref 0.40–1.20)
GFR: 107.85 mL/min (ref >60.00)
GLUCOSE: 122 mg/dL — AB (ref 70–99)
Potassium: 3.6 mEq/L (ref 3.5–5.1)
SODIUM: 139 meq/L (ref 135–145)

## 2014-10-20 LAB — MAGNESIUM: Magnesium: 1.8 mg/dL (ref 1.5–2.5)

## 2014-10-20 NOTE — Progress Notes (Signed)
Date:  10/20/2014   ID:  Crystal RipperFrances I Deady, DOB 25-Jan-1930, MRN [id number]  PCP:  Pamelia HoitWILSON,FRED HENRY, MD  Cardiologist:  Dr Jacinto HalimGanji Primary Electrophysiologist: Hillis RangeJames Allred, MD    No chief complaint on file.    History of Present Illness: Crystal Hess is a 81 y.o. female who presents today for Tikosyn initiation.  She was last seen by Dr. Johney FrameAllred on 10/10/14.     She reports initially being diagnosed with atrial fibrillation 9/15 after presenting to Dr Julien NordmannWilsons office for a routine physical exam.  She was unaware of afib at the time.  In retrospect, she has had exertional fatigue for 3-4 years.  She was referred to Dr Jacinto HalimGanji and she was initiated on Xarelto.  She did not tolerate this medicine (attributes gout and feeling "sick" to Xarelto).  She was switched to Eliquis which she has tolerated.  She underwent cardioversion 07/26/14.  She quickly returned to atrial fibrillation.  She thinks that she did have a little more energy in sinus rhythm.  She was placed on flecainide and underwent repeat cardioversion 08/30/14.  She again returned to atrial fibrillation within about a week.  She has been persistently in afib since that time.  Dr. Johney FrameAllred discussed rate versus rhythm control with patient.  She opted to pursue rhythm control.  Amiodarone was not an option due to hyperthyroidism and sotalol was not chosen due to history of bradycardia.    Pt is accompanied with her sister today.  Both were educated on the potential side effects of Tikosyn including QTc prolongation.  She is aware of the importance of compliance and will report if she misses more than 2 doses in a row.    Pt stopped her HCTZ on Monday.  She is currently not taking any other contraindicated or QTc prolongating medications.  She stopped flecainide prior to her visit with Dr. Johney FrameAllred on 4/4 so there has been an appropriate wash out period.  She is appropriately anticoagulated with Eliquis but she does report missing 1 dose last Friday  4/8.     Past Medical History  Diagnosis Date  . Hypertension   . Asthma   . DOE (dyspnea on exertion)     wears O2 at night but has not had a sleep study  . Mitral regurgitation     mild  . Trace tricuspid regurgitation by prior echocardiogram   . Persistent atrial fibrillation   . Obesity   . DJD (degenerative joint disease)   . Hyperthyroidism     Current Outpatient Prescriptions  Medication Sig Dispense Refill  . beclomethasone (QVAR) 80 MCG/ACT inhaler INHALE 1 PUFF BY MOUTH TWICE DAILY (Patient taking differently: INHALE 1 PUFF INTO THE LUNGS TWICE DAILY) 8.7 g 6  . Calcium Carbonate-Vit D-Min (CALCIUM 600 + MINERALS) 600-200 MG-UNIT TABS Take 1 tablet by mouth daily.    Marland Kitchen. ELIQUIS 5 MG TABS tablet Take 5 mg by mouth 2 (two) times daily.  5  . Fexofenadine HCl (ALLEGRA ALLERGY PO) Take 1 tablet by mouth daily.     . fluticasone (FLONASE) 50 MCG/ACT nasal spray Place 2 sprays into both nostrils daily as needed. ALLERGIES  1  . FORADIL AEROLIZER 12 MCG capsule for inhaler INHALE CONTENTS OF CAPSULE TWICE DAILY (Patient taking differently: INHALE CONTENTS OF CAPSULE TWICE DAILY AS NEEDED FOR SHORTNESS OF BREATH OR WHEEZING) 60 capsule 0  . methimazole (TAPAZOLE) 5 MG tablet Take 5 mg by mouth at bedtime.   2  . Omega-3  Fatty Acids (FISH OIL) 600 MG CAPS Take 1 capsule by mouth daily.    Marland Kitchen PROVENTIL HFA 108 (90 BASE) MCG/ACT inhaler INHALE 2 PUFFS INTO LUNGS EVERY 6 HOURS AS NEEDED FOR WHEEZING AND SHORTNESS OF BREATH 6.7 g 2  . valsartan-hydrochlorothiazide (DIOVAN-HCT) 160-12.5 MG per tablet Take 1 tablet by mouth daily.       No current facility-administered medications for this visit.    Allergies:   Codeine and Methotrexate derivatives   EKG:  EKG reviewed by Dr. Johney Frame.  The ekg ordered today shows afib, V rate 68 bpm, QRS 76, Qtc 435, otherwise normal ekg  ASSESSMENT AND PLAN:  1.  Persistent atrial fibrillation Discussed with Dr. Johney Frame.  Since patient has missed a  dose of Eliquis within the past 3 weeks, will need to delay Tikosyn initiation until she has 3 weeks of uninterrupted therapy.    2. HTN Will change valsartan/HCTZ to valsartan and low dose furosemide.  Pt has noticed extra swelling and increased weight since stopping HCTZ earlier this week so suspect she will need low dose furosemide.  Will await BMET results from today to determine if she will also need a potassium supplement as well.    Edrick Oh, [facility]  10/20/2014 9:22 AM     [facility] [address] [place] [address] [phone] (office) [phone] (fax)

## 2014-10-21 MED ORDER — VALSARTAN 160 MG PO TABS: 160.0000 mg | ORAL_TABLET | Freq: Every day | ORAL | Status: DC

## 2014-10-21 MED ORDER — POTASSIUM CHLORIDE CRYS ER 20 MEQ PO TBCR: 20.0000 meq | EXTENDED_RELEASE_TABLET | Freq: Every day | ORAL | Status: DC

## 2014-10-21 MED ORDER — FUROSEMIDE 20 MG PO TABS: 20.0000 mg | ORAL_TABLET | Freq: Every day | ORAL | Status: DC

## 2014-10-21 NOTE — Progress Notes (Signed)
Reviewed pt's labs.  K low at 3.6 for tikosyn initiation.  Will chang her to valsartan, furosemide 20mg  daily and potassium chloride 20mEq daily.  Pt is aware.  Appt made to try to start Tikosyn on 5/3.

## 2014-11-08 ENCOUNTER — Other Ambulatory Visit (INDEPENDENT_AMBULATORY_CARE_PROVIDER_SITE_OTHER): Payer: Medicare Other | Admitting: *Deleted

## 2014-11-08 ENCOUNTER — Ambulatory Visit (INDEPENDENT_AMBULATORY_CARE_PROVIDER_SITE_OTHER): Payer: Medicare Other | Admitting: Pharmacist Clinician (PhC)/ Clinical Pharmacy Specialist

## 2014-11-08 ENCOUNTER — Encounter (HOSPITAL_COMMUNITY): Payer: Self-pay | Admitting: General Practice

## 2014-11-08 ENCOUNTER — Inpatient Hospital Stay (HOSPITAL_COMMUNITY)
Admission: AD | Admit: 2014-11-08 | Discharge: 2014-11-11 | DRG: 310 | Disposition: A | Discharge status: Home / Self Care | Payer: Medicare Other | Source: Ambulatory Visit | Attending: Internal Medicine | Admitting: Internal Medicine

## 2014-11-08 VITALS — Wt 174.5 lb

## 2014-11-08 DIAGNOSIS — G4730 Sleep apnea, unspecified: Secondary | ICD-10-CM | POA: Diagnosis present

## 2014-11-08 DIAGNOSIS — M109 Gout, unspecified: Secondary | ICD-10-CM | POA: Diagnosis present

## 2014-11-08 DIAGNOSIS — Z5181 Encounter for therapeutic drug level monitoring: Secondary | ICD-10-CM

## 2014-11-08 DIAGNOSIS — E669 Obesity, unspecified: Secondary | ICD-10-CM | POA: Diagnosis present

## 2014-11-08 DIAGNOSIS — Z79899 Other long term (current) drug therapy: Secondary | ICD-10-CM | POA: Diagnosis not present

## 2014-11-08 DIAGNOSIS — Z9981 Dependence on supplemental oxygen: Secondary | ICD-10-CM

## 2014-11-08 DIAGNOSIS — Z7951 Long term (current) use of inhaled steroids: Secondary | ICD-10-CM

## 2014-11-08 DIAGNOSIS — I10 Essential (primary) hypertension: Secondary | ICD-10-CM | POA: Diagnosis present

## 2014-11-08 DIAGNOSIS — I481 Persistent atrial fibrillation: Secondary | ICD-10-CM | POA: Diagnosis present

## 2014-11-08 DIAGNOSIS — R0683 Snoring: Secondary | ICD-10-CM | POA: Diagnosis present

## 2014-11-08 DIAGNOSIS — E876 Hypokalemia: Secondary | ICD-10-CM | POA: Diagnosis not present

## 2014-11-08 DIAGNOSIS — I4819 Other persistent atrial fibrillation: Secondary | ICD-10-CM | POA: Diagnosis present

## 2014-11-08 DIAGNOSIS — E0590 Thyrotoxicosis, unspecified without thyrotoxic crisis or storm: Secondary | ICD-10-CM | POA: Diagnosis present

## 2014-11-08 DIAGNOSIS — J45909 Unspecified asthma, uncomplicated: Secondary | ICD-10-CM | POA: Diagnosis present

## 2014-11-08 DIAGNOSIS — Z7902 Long term (current) use of antithrombotics/antiplatelets: Secondary | ICD-10-CM | POA: Diagnosis not present

## 2014-11-08 DIAGNOSIS — Z825 Family history of asthma and other chronic lower respiratory diseases: Secondary | ICD-10-CM

## 2014-11-08 DIAGNOSIS — Z6831 Body mass index (BMI) 31.0-31.9, adult: Secondary | ICD-10-CM | POA: Diagnosis not present

## 2014-11-08 DIAGNOSIS — I4891 Unspecified atrial fibrillation: Secondary | ICD-10-CM | POA: Diagnosis present

## 2014-11-08 HISTORY — DX: Unspecified osteoarthritis, unspecified site: M19.90

## 2014-11-08 HISTORY — DX: Gout, unspecified: M10.9

## 2014-11-08 HISTORY — DX: Dependence on supplemental oxygen: Z99.81

## 2014-11-08 HISTORY — DX: Low back pain, unspecified: M54.50

## 2014-11-08 HISTORY — DX: Family history of other specified conditions: Z84.89

## 2014-11-08 HISTORY — DX: Pneumonia, unspecified organism: J18.9

## 2014-11-08 HISTORY — DX: Other chronic pain: G89.29

## 2014-11-08 HISTORY — DX: Low back pain: M54.5

## 2014-11-08 LAB — BASIC METABOLIC PANEL
BUN: 24 mg/dL — AB (ref 6–23)
CALCIUM: 9.7 mg/dL (ref 8.4–10.5)
CO2: 28 mEq/L (ref 19–32)
CREATININE: 0.61 mg/dL (ref 0.40–1.20)
Chloride: 106 mEq/L (ref 96–112)
GFR: 99.71 mL/min (ref >60.00)
GLUCOSE: 113 mg/dL — AB (ref 70–99)
POTASSIUM: 4.3 meq/L (ref 3.5–5.1)
Sodium: 139 mEq/L (ref 135–145)

## 2014-11-08 LAB — MAGNESIUM: MAGNESIUM: 1.8 mg/dL (ref 1.5–2.5)

## 2014-11-08 MED ORDER — IRBESARTAN 150 MG PO TABS
150.0000 mg | ORAL_TABLET | Freq: Every day | ORAL | Status: DC
  Administered 2014-11-08 – 2014-11-11 (×4): 150 mg via ORAL
  Filled 2014-11-08 (×4): qty 1

## 2014-11-08 MED ORDER — CALCIUM CARBONATE-VITAMIN D 500-200 MG-UNIT PO TABS
1.0000 | ORAL_TABLET | Freq: Every day | ORAL | Status: DC
  Administered 2014-11-09 – 2014-11-11 (×3): 1 via ORAL
  Filled 2014-11-08 (×3): qty 1

## 2014-11-08 MED ORDER — FLUTICASONE PROPIONATE 50 MCG/ACT NA SUSP
2.0000 | Freq: Every day | NASAL | Status: DC | PRN
  Administered 2014-11-09 – 2014-11-11 (×2): 2 via NASAL
  Filled 2014-11-08: qty 16

## 2014-11-08 MED ORDER — OMEGA-3-ACID ETHYL ESTERS 1 G PO CAPS
1.0000 g | ORAL_CAPSULE | Freq: Every day | ORAL | Status: DC
  Administered 2014-11-08 – 2014-11-11 (×4): 1 g via ORAL
  Filled 2014-11-08 (×4): qty 1

## 2014-11-08 MED ORDER — CALCIUM 600 + MINERALS 600-200 MG-UNIT PO TABS: 1.0000 | ORAL_TABLET | Freq: Every day | ORAL | Status: DC

## 2014-11-08 MED ORDER — APIXABAN 5 MG PO TABS
5.0000 mg | ORAL_TABLET | Freq: Two times a day (BID) | ORAL | Status: DC
  Administered 2014-11-08 – 2014-11-11 (×6): 5 mg via ORAL
  Filled 2014-11-08 (×6): qty 1

## 2014-11-08 MED ORDER — MAGNESIUM OXIDE 400 (241.3 MG) MG PO TABS
400.0000 mg | ORAL_TABLET | Freq: Every day | ORAL | Status: DC
  Administered 2014-11-09 – 2014-11-11 (×3): 400 mg via ORAL
  Filled 2014-11-08 (×3): qty 1

## 2014-11-08 MED ORDER — FUROSEMIDE 20 MG PO TABS
20.0000 mg | ORAL_TABLET | Freq: Every day | ORAL | Status: DC
  Administered 2014-11-08 – 2014-11-11 (×4): 20 mg via ORAL
  Filled 2014-11-08 (×4): qty 1

## 2014-11-08 MED ORDER — MAGNESIUM SULFATE IN D5W 10-5 MG/ML-% IV SOLN
1.0000 g | Freq: Once | INTRAVENOUS | Status: AC
  Administered 2014-11-08: 1 g via INTRAVENOUS
  Filled 2014-11-08 (×2): qty 100

## 2014-11-08 MED ORDER — ALBUTEROL SULFATE (2.5 MG/3ML) 0.083% IN NEBU: 2.5000 mg | INHALATION_SOLUTION | Freq: Four times a day (QID) | RESPIRATORY_TRACT | Status: DC | PRN

## 2014-11-08 MED ORDER — POTASSIUM CHLORIDE CRYS ER 20 MEQ PO TBCR
20.0000 meq | EXTENDED_RELEASE_TABLET | Freq: Every day | ORAL | Status: DC
  Administered 2014-11-08: 20 meq via ORAL
  Filled 2014-11-08: qty 1

## 2014-11-08 MED ORDER — DOFETILIDE 500 MCG PO CAPS
500.0000 ug | ORAL_CAPSULE | Freq: Two times a day (BID) | ORAL | Status: DC
  Administered 2014-11-08 – 2014-11-11 (×6): 500 ug via ORAL
  Filled 2014-11-08 (×6): qty 1

## 2014-11-08 MED ORDER — SALMETEROL XINAFOATE 50 MCG/DOSE IN AEPB
1.0000 | INHALATION_SPRAY | Freq: Two times a day (BID) | RESPIRATORY_TRACT | Status: DC
  Administered 2014-11-08 – 2014-11-11 (×7): 1 via RESPIRATORY_TRACT
  Filled 2014-11-08: qty 0

## 2014-11-08 MED ORDER — METHIMAZOLE 5 MG PO TABS
5.0000 mg | ORAL_TABLET | Freq: Every day | ORAL | Status: DC
  Administered 2014-11-08 – 2014-11-10 (×3): 5 mg via ORAL
  Filled 2014-11-08 (×5): qty 1

## 2014-11-08 MED ORDER — FLUTICASONE PROPIONATE HFA 44 MCG/ACT IN AERO
2.0000 | INHALATION_SPRAY | Freq: Two times a day (BID) | RESPIRATORY_TRACT | Status: DC
  Administered 2014-11-08 – 2014-11-11 (×7): 2 via RESPIRATORY_TRACT
  Filled 2014-11-08: qty 10.6

## 2014-11-08 MED ORDER — SODIUM CHLORIDE 0.9 % IJ SOLN: 3.0000 mL | INTRAMUSCULAR | Status: DC | PRN

## 2014-11-08 MED ORDER — FEXOFENADINE HCL 60 MG PO TABS
60.0000 mg | ORAL_TABLET | Freq: Every day | ORAL | Status: DC
  Administered 2014-11-08 – 2014-11-11 (×3): 60 mg via ORAL
  Filled 2014-11-08 (×4): qty 1

## 2014-11-08 MED ORDER — SODIUM CHLORIDE 0.9 % IV SOLN
250.0000 mL | INTRAVENOUS | Status: DC | PRN
  Administered 2014-11-08: 250 mL via INTRAVENOUS

## 2014-11-08 MED ORDER — FISH OIL 600 MG PO CAPS: 1.0000 | ORAL_CAPSULE | Freq: Every day | ORAL | Status: DC

## 2014-11-08 MED ORDER — ALBUTEROL SULFATE HFA 108 (90 BASE) MCG/ACT IN AERS: 1.0000 | INHALATION_SPRAY | Freq: Four times a day (QID) | RESPIRATORY_TRACT | Status: DC | PRN

## 2014-11-08 MED ORDER — SODIUM CHLORIDE 0.9 % IJ SOLN
3.0000 mL | Freq: Two times a day (BID) | INTRAMUSCULAR | Status: DC
  Administered 2014-11-09 – 2014-11-11 (×4): 3 mL via INTRAVENOUS

## 2014-11-08 NOTE — Addendum Note (Signed)
Addended by: Tonita PhoenixBOWDEN, ROBIN K on: 11/08/2014 10:23 AM   Modules accepted: Orders

## 2014-11-08 NOTE — Progress Notes (Signed)
Date:  11/08/2014   ID:  Alan Ripper, DOB 04/03/1930, MRN [id number]  PCP:  Pamelia Hoit, MD  Cardiologist:  Dr Jacinto Halim Primary Electrophysiologist: Hillis Range, MD    No chief complaint on file.    History of Present Illness: Crystal Hess is a 81 y.o. female who presents today for Tikosyn initiation.  She was last seen by Dr. Johney Frame on 10/10/14.     She reports initially being diagnosed with atrial fibrillation 9/15 after presenting to Dr Julien Nordmann office for a routine physical exam.  She was unaware of afib at the time.  In retrospect, she has had exertional fatigue for 3-4 years.  She was referred to Dr Jacinto Halim and she was initiated on Xarelto.  She did not tolerate this medicine (attributes gout and feeling "sick" to Xarelto).  She was switched to Eliquis which she has tolerated.  She underwent cardioversion 07/26/14.  She quickly returned to atrial fibrillation.  She thinks that she did have a little more energy in sinus rhythm.  She was placed on flecainide and underwent repeat cardioversion 08/30/14.  She again returned to atrial fibrillation within about a week.  She has been persistently in afib since that time.  Dr. Johney Frame discussed rate versus rhythm control with patient.  She opted to pursue rhythm control.  Amiodarone was not an option due to hyperthyroidism and sotalol was not chosen due to history of bradycardia.    Pt was in office on 10/20/14 for initiation, however her potassium level was low at 3.6.  She was started on Potassium daily and returns today for repeat labs.    Pt is accompanied by her brother today.  Both were educated on the potential side effects of Tikosyn including QTc prolongation.  She is aware of the importance of compliance and will report if she misses more than 2 doses in a row.    She is currently not taking any contraindicated or QTc prolongating medications.  She stopped flecainide prior to her visit with Dr. Johney Frame on 4/4 so there has  been an appropriate wash out period.  She is appropriately anticoagulated with Eliquis but she does report missing 1 dose Friday 4/8.     Past Medical History  Diagnosis Date  . Hypertension   . Asthma   . DOE (dyspnea on exertion)     wears O2 at night but has not had a sleep study  . Mitral regurgitation     mild  . Trace tricuspid regurgitation by prior echocardiogram   . Persistent atrial fibrillation   . Obesity   . DJD (degenerative joint disease)   . Hyperthyroidism     Current Outpatient Prescriptions  Medication Sig Dispense Refill  . beclomethasone (QVAR) 80 MCG/ACT inhaler INHALE 1 PUFF BY MOUTH TWICE DAILY (Patient taking differently: INHALE 1 PUFF INTO THE LUNGS TWICE DAILY) 8.7 g 6  . Calcium Carbonate-Vit D-Min (CALCIUM 600 + MINERALS) 600-200 MG-UNIT TABS Take 1 tablet by mouth daily.    Marland Kitchen ELIQUIS 5 MG TABS tablet Take 5 mg by mouth 2 (two) times daily.  5  . Fexofenadine HCl (ALLEGRA ALLERGY PO) Take 1 tablet by mouth daily.     . fluticasone (FLONASE) 50 MCG/ACT nasal spray Place 2 sprays into both nostrils daily as needed. ALLERGIES  1  . FORADIL AEROLIZER 12 MCG capsule for inhaler INHALE CONTENTS OF CAPSULE TWICE DAILY (Patient taking differently: INHALE CONTENTS OF CAPSULE TWICE DAILY AS NEEDED FOR SHORTNESS OF BREATH OR  WHEEZING) 60 capsule 0  . furosemide (LASIX) 20 MG tablet Take 1 tablet (20 mg total) by mouth daily. 30 tablet 3  . methimazole (TAPAZOLE) 5 MG tablet Take 5 mg by mouth at bedtime.   2  . Omega-3 Fatty Acids (FISH OIL) 600 MG CAPS Take 1 capsule by mouth daily.    . potassium chloride SA (K-DUR,KLOR-CON) 20 MEQ tablet Take 1 tablet (20 mEq total) by mouth daily. 30 tablet 3  . PROVENTIL HFA 108 (90 BASE) MCG/ACT inhaler INHALE 2 PUFFS INTO LUNGS EVERY 6 HOURS AS NEEDED FOR WHEEZING AND SHORTNESS OF BREATH 6.7 g 2  . valsartan (DIOVAN) 160 MG tablet Take 1 tablet (160 mg total) by mouth daily. 30 tablet 6   No current facility-administered  medications for this visit.    Allergies:   Codeine and Methotrexate derivatives   EKG:  EKG reviewed by Dr. Johney FrameAllred.  The ekg ordered today shows afib, V rate 65 bpm, QRS 66, Qtc 426, otherwise normal ekg  ASSESSMENT AND PLAN:  1.  Persistent atrial fibrillation Dr. Ladona Ridgelaylor reviewed EKG.   Answered patient questions regarding missed doses, medication changes.   Potassium 4.2, Magnesium 1.8    Pt aware to go to hospital this afternoon for admit.   Kirk RuthsSigned, Darivs Lunden, RPH-CPP  11/08/2014 10:27 AM     [facility] [address] [place] [address] [phone] (office) [phone] (fax)

## 2014-11-08 NOTE — Addendum Note (Signed)
Addended by: Tonita PhoenixBOWDEN, Lalo Tromp K on: 11/08/2014 10:22 AM   Modules accepted: Orders

## 2014-11-08 NOTE — H&P (Signed)
Crystal Hess is an 81 y.o. female.    Primary Cardiologist: Dr. Jacinto Halim Primary EP: Dr. Johney Frame  PCP:  Pamelia Hoit, MD  Chief Complaint: a fib here for Tikosyn load HPI: 81 y.o. female who presents today for Tikosyn load after electrophysiology evaluation 10/10/14 with Dr. Johney Frame for evaluation of atrial fibrillation. She reports initially being diagnosed with atrial fibrillation 9/15 after presenting to Dr Julien Nordmann office for a routine physical exam. She was unaware of afib at the time. In retrospect, she has had exertional fatigue for 3-4 years. She was referred to Dr Jacinto Halim and she was initiated on xarelto. She did not tolerate this medicine (attributes gout and feeling "sick" to xarelto). She was switched to eliquis which she has tolerated. She underwent cardioversion 07/26/14. She quickly returned to atrial fibrillation. She thinks that she did have a little more energy in sinus rhythm. She was placed on flecainide and underwent repeat cardioversion 08/30/14. She again returned to atrial fibrillation within about a week. She has been persistently in afib since that time. She remains active. Her primary limitation is due to knee pain and gout. She wants to have L knee surgery soon. She has SOB with moderate activity.  She has occasional ankle swelling at night.   She denies symptoms of palpitations, chest pain, orthopnea, PND, claudication, dizziness, presyncope, recent syncope, bleeding, or neurologic sequela. The patient is tolerating medications without difficulties and is otherwise without complaint today.   Dr. Johney Frame made plans for her to be admitted for Tikosyn load after long discussion with her in the office.  Given prior sinus bradycardia, he felt he would avoid sotalol. Could consider amiodarone however with hyperthyroid but he felt he would like to avoid this. He felt her anticipated success rates with ablation would be 55-60% and risks are probably a  little higher.   Chads2vasc score is at least 4. Continue long term anticoagulation. She is doing well with eliquis.  Other issues are sleep apnea and Dr. Verl Dicker office will arrange.   Labs today Na 139, K+4.3, BUN 24 and Cr. 0.61  Magnesium 1.8.   She remains in a fib.     Past Medical History  Diagnosis Date  . Hypertension   . Asthma   . DOE (dyspnea on exertion)     wears O2 at night but has not had a sleep study  . Mitral regurgitation     mild  . Trace tricuspid regurgitation by prior echocardiogram   . Persistent atrial fibrillation   . Obesity   . Hyperthyroidism   . Family history of adverse reaction to anesthesia     "sister's heart stopped and had to be revived; think it was related to the demoral"  . Walking pneumonia ~ 1998  . On home oxygen therapy     "2L just at night" (11/08/2014)  . DJD (degenerative joint disease)   . Arthritis     "knees, hands" (11/08/2014)  . Chronic lower back pain   . Gout     "I've had it twice already this year" (11/08/2014)    Past Surgical History  Procedure Laterality Date  . Excisional hemorrhoidectomy  ~ 1960  . Ovarian cyst removal Right 1963    "gangrene"  . Shoulder arthroscopy Left ~ 2008    bone spur  . Knee cartilage surgery Right 1976    "cut it open"  . Tonsillectomy    . Cardioversion N/A 07/26/2014    Procedure:  CARDIOVERSION;  Surgeon: Pamella Pert, MD;  Location: [facility] ENDOSCOPY;  Service: Cardiovascular;  Laterality: N/A;  . Cardioversion N/A 08/30/2014    Procedure: CARDIOVERSION;  Surgeon: Pamella Pert, MD;  Location: [facility] ENDOSCOPY;  Service: Cardiovascular;  Laterality: N/A;  . Cataract extraction w/ intraocular lens  implant, bilateral Bilateral ~ 2006    Family History  Problem Relation Age of Onset  . Lymphoma Mother   . Lung cancer Father     smoker  . Emphysema Brother    Social History:  reports that she has never smoked. She has never used smokeless tobacco. She reports that she does not  drink alcohol or use illicit drugs.  Allergies:  Allergies  Allergen Reactions  . Codeine Nausea Only  . Meperidine And Related     "have hallucinations; get combative; I don't want that"  . Methotrexate Derivatives Other (See Comments)    Restless    Medications Prior to Admission  Medication Sig Dispense Refill  . beclomethasone (QVAR) 80 MCG/ACT inhaler INHALE 1 PUFF BY MOUTH TWICE DAILY (Patient taking differently: INHALE 1 PUFF INTO THE LUNGS TWICE DAILY) 8.7 g 6  . Calcium Carbonate-Vit D-Min (CALCIUM 600 + MINERALS) 600-200 MG-UNIT TABS Take 1 tablet by mouth daily.    Marland Kitchen ELIQUIS 5 MG TABS tablet Take 5 mg by mouth 2 (two) times daily.  5  . Fexofenadine HCl (ALLEGRA ALLERGY PO) Take 1 tablet by mouth daily.     . fluticasone (FLONASE) 50 MCG/ACT nasal spray Place 2 sprays into both nostrils daily as needed. ALLERGIES  1  . FORADIL AEROLIZER 12 MCG capsule for inhaler INHALE CONTENTS OF CAPSULE TWICE DAILY (Patient taking differently: INHALE CONTENTS OF CAPSULE TWICE DAILY AS NEEDED FOR SHORTNESS OF BREATH OR WHEEZING) 60 capsule 0  . furosemide (LASIX) 20 MG tablet Take 1 tablet (20 mg total) by mouth daily. 30 tablet 3  . methimazole (TAPAZOLE) 5 MG tablet Take 5 mg by mouth at bedtime.   2  . Omega-3 Fatty Acids (FISH OIL) 600 MG CAPS Take 1 capsule by mouth daily.    . potassium chloride SA (K-DUR,KLOR-CON) 20 MEQ tablet Take 1 tablet (20 mEq total) by mouth daily. 30 tablet 3  . PROVENTIL HFA 108 (90 BASE) MCG/ACT inhaler INHALE 2 PUFFS INTO LUNGS EVERY 6 HOURS AS NEEDED FOR WHEEZING AND SHORTNESS OF BREATH 6.7 g 2  . valsartan (DIOVAN) 160 MG tablet Take 1 tablet (160 mg total) by mouth daily. 30 tablet 6    Results for orders placed or performed in visit on 11/08/14 (from the past 48 hour(s))  Basic Metabolic Panel (BMET)     Status: Abnormal   Collection Time: 11/08/14 10:23 AM  Result Value Ref Range   Sodium 139 135 - 145 mEq/L   Potassium 4.3 3.5 - 5.1 mEq/L    Chloride 106 96 - 112 mEq/L   CO2 28 19 - 32 mEq/L   Glucose, Bld 113 (H) 70 - 99 mg/dL   BUN 24 (H) 6 - 23 mg/dL   Creatinine, Ser 9.60 0.40 - 1.20 mg/dL   Calcium 9.7 8.4 - 45.4 mg/dL   GFR 09.81 >19.14 mL/min  Magnesium     Status: None   Collection Time: 11/08/14 10:23 AM  Result Value Ref Range   Magnesium 1.8 1.5 - 2.5 mg/dL   No results found.  ROS: Review of Systems  Constitutional: Negative for fever, chills and diaphoresis.  HENT: Negative.   Eyes: Negative for blurred vision.  Respiratory: Negative for cough and wheezing.   Cardiovascular: Positive for palpitations. Negative for chest pain and orthopnea.  Gastrointestinal: Negative for heartburn, nausea, abdominal pain and blood in stool.  Genitourinary: Negative for dysuria and hematuria.  Musculoskeletal: Negative for back pain.  Skin: Negative.   Neurological: Negative for dizziness.  Endo/Heme/Allergies: Does not bruise/bleed easily.  Psychiatric/Behavioral: Negative for depression.     Blood pressure 138/75, pulse 89, temperature 97.7 F (36.5 C), temperature source Oral, resp. rate 15, height 5\' 3"  (1.6 m), weight 173 lb 6.4 oz (78.654 kg), SpO2 96 %. PE: General:Pleasant affect, NAD Skin:Warm and dry, brisk capillary refill HEENT:normocephalic, sclera clear, mucus membranes moist Neck:supple, no JVD, no bruits  Heart:irre irreg without murmur, gallup, rub or click Lungs:clear without rales, rhonchi, or wheezes ZOX:WRUEAbd:soft, non tender, + BS, do not palpate liver spleen or masses Ext:no lower ext edema, 2+ pedal pulses, 2+ radial pulses Neuro:alert and oriented X 3, MAE, follows commands, + facial symmetry    Assessment/Plan Principal Problem:   Persistent atrial fibrillation- admitted for Tikosyn load, K+ stable , Mg+ 1.8, office EKG Qtc 426- reviewed by Dr. Ladona Ridgelaylor today.   She is currently not taking any contraindicated or QTc prolongating medications. She stopped flecainide prior to her visit with Dr.  Johney FrameAllred on 4/4 so there has been an appropriate wash out period. She is appropriately anticoagulated with Eliquis but she does report missing 1 dose Friday 4/8.    Active Problems:   Snoring- sleep study per Dr. Jacinto HalimGanji   Essential hypertension- stable   Visit for monitoring Tikosyn therapy    [facility] CenterNGOLD,LAURA R Nurse Practitioner Certified [facility] Pager [phone] or after 5pm or weekends call [phone] 11/08/2014, 4:41 PM   Have reveiwed risks and benefits for tikosyn  Will anticpate DCCV onWed

## 2014-11-09 DIAGNOSIS — E876 Hypokalemia: Secondary | ICD-10-CM

## 2014-11-09 LAB — BASIC METABOLIC PANEL
Anion gap: 10 (ref 5–15)
BUN: 19 mg/dL (ref 6–20)
CO2: 23 mmol/L (ref 22–32)
Calcium: 9 mg/dL (ref 8.9–10.3)
Chloride: 105 mmol/L (ref 101–111)
Creatinine, Ser: 0.61 mg/dL (ref 0.44–1.00)
GFR calc Af Amer: >60 mL/min (ref >60)
GLUCOSE: 107 mg/dL — AB (ref 70–99)
POTASSIUM: 3.8 mmol/L (ref 3.5–5.1)
SODIUM: 138 mmol/L (ref 135–145)

## 2014-11-09 LAB — MAGNESIUM: Magnesium: 2 mg/dL (ref 1.7–2.4)

## 2014-11-09 MED ORDER — POTASSIUM CHLORIDE CRYS ER 20 MEQ PO TBCR
40.0000 meq | EXTENDED_RELEASE_TABLET | Freq: Every day | ORAL | Status: DC
  Administered 2014-11-09 – 2014-11-11 (×3): 40 meq via ORAL
  Filled 2014-11-09 (×3): qty 2

## 2014-11-09 NOTE — Discharge Instructions (Signed)

## 2014-11-09 NOTE — Progress Notes (Signed)
UR Completed. Melyna Huron, RN, BSN.  [phone] 

## 2014-11-09 NOTE — Progress Notes (Signed)
Pt viewing Tikosyn educational video.

## 2014-11-09 NOTE — Care Management Note (Addendum)
Case Management Note  Patient Details  Name: Crystal Hess MRN: [id number] Date of Birth: 14-Sep-1929  Subjective/Objective:    Pt admitted with persistent A Fib        Action/Plan:  CM will continue to monitor for disposition needs   Expected Discharge Date:  11/11/14               Expected Discharge Plan:  Home/Self Care  In-House Referral:     Discharge planning Services  CM Consult  Post Acute Care Choice:    Choice offered to:     DME Arranged:    DME Agency:     HH Arranged:    HH Agency:     Status of Service:  In process, will continue to follow  Medicare Important Message Given:  Yes Date Medicare IM Given:  11/09/14 Medicare IM give by:  [id number] Date Additional Medicare IM Given:    Additional Medicare Important Message give by:     If discussed at Long Length of Stay Meetings, dates discussed:    Additional Comments: 11/09/14;  CM received benefit check ; 11/09/2014 Called [facility] at [phone]. Talked to CSR [name]. [name] is covered as a TIER 3 Medication. No PRIOR AUTHORIZATION required. The retail pharmacy co-payment for all three dosages (500mcg, 250mcg, and 125mcg) will be $64.00.    [facility].   CM informed pt of copay per month.  Pt stated she wanted to use [facility] in [place].  CM contacted [facility] in [place] and was informed that both the 125 mcg and 500 mcg are available for pickup upon discharge, however if the pt needs 250 mcg pharmacy could have it next day post prescription drop off.  CM informed pt that she would receive 7 day supply at discharge.  CM placed MD sticky note requesting; 7 day prescription without refills in addition to ongoing with refills.  NO additional CM needs at this time.   [name], [name], RN 11/09/2014, 11:33 AM

## 2014-11-09 NOTE — Progress Notes (Signed)
Patient Name: Crystal RipperFrances I Depinto      SUBJECTIVE:no change  Past Medical History  Diagnosis Date  . Hypertension   . Asthma   . DOE (dyspnea on exertion)     wears O2 at night but has not had a sleep study  . Mitral regurgitation     mild  . Trace tricuspid regurgitation by prior echocardiogram   . Persistent atrial fibrillation   . Obesity   . Hyperthyroidism   . Family history of adverse reaction to anesthesia     "sister's heart stopped and had to be revived; think it was related to the demoral"  . Walking pneumonia ~ 1998  . On home oxygen therapy     "2L just at night" (11/08/2014)  . DJD (degenerative joint disease)   . Arthritis     "knees, hands" (11/08/2014)  . Chronic lower back pain   . Gout     "I've had it twice already this year" (11/08/2014)    Scheduled Meds:  Scheduled Meds: . apixaban  5 mg Oral BID  . calcium-vitamin D  1 tablet Oral Q breakfast  . dofetilide  500 mcg Oral BID  . fexofenadine  60 mg Oral Daily  . fluticasone  2 puff Inhalation BID  . furosemide  20 mg Oral Daily  . irbesartan  150 mg Oral Daily  . magnesium oxide  400 mg Oral Daily  . methimazole  5 mg Oral QHS  . omega-3 acid ethyl esters  1 g Oral Daily  . potassium chloride SA  20 mEq Oral Daily  . salmeterol  1 puff Inhalation Q12H  . sodium chloride  3 mL Intravenous Q12H   Continuous Infusions:  sodium chloride, albuterol, fluticasone, sodium chloride    PHYSICAL EXAM Filed Vitals:   11/08/14 1730 11/08/14 1928 11/08/14 2101 11/09/14 0625  BP:   149/57 126/48  Pulse:   69 53  Temp:   98 F (36.7 C) 97 F (36.1 C)  TempSrc:   Oral Oral  Resp:   18 18  Height: 5\' 3"  (1.6 m)     Weight: 174 lb (78.926 kg)   174 lb 2.6 oz (79 kg)  SpO2:  96% 97% 96%    Well developed and nourished in no acute distress HENT normal Neck supple with JVP-flat Some crackles Regular rate and rhythm, no murmurs or gallops Abd-soft with active BS No Clubbing cyanosis  edema Skin-warm and dry A & Oriented  Grossly normal sensory and motor function   TELEMETRY: Reviewed telemetry pt in  Afib>>NSR:  ECG  Sinus withQT 420 and QTc 480  Last pm' This am QTc $$)   Intake/Output Summary (Last 24 hours) at 11/09/14 0815 Last data filed at 11/08/14 1824  Gross per 24 hour  Intake    300 ml  Output      0 ml  Net    300 ml    LABS: Basic Metabolic Panel:  Recent Labs Lab 11/08/14 1023 11/09/14 0423  NA 139 138  K 4.3 3.8  CL 106 105  CO2 28 23  GLUCOSE 113* 107*  BUN 24* 19  CREATININE 0.61 0.61  CALCIUM 9.7 9.0  MG 1.8 2.0   Cardiac Enzymes:  ASSESSMENT AND PLAN:  Principal Problem:   Persistent atrial fibrillation Active Problems:   Snoring   Essential hypertension   Visit for monitoring Tikosyn therapy   Atrial fibrillation  Converted to sinus after 1st dse of dofetilide  QTc change is concerning but still within range  Will review following 2 nd dose and determine subsequent dose K low needs repletion   Signed, Sherryl MangesSteven Ethlyn Alto MD  11/09/2014

## 2014-11-10 LAB — BASIC METABOLIC PANEL
ANION GAP: 8 (ref 5–15)
BUN: 12 mg/dL (ref 6–20)
CO2: 24 mmol/L (ref 22–32)
Calcium: 9.6 mg/dL (ref 8.9–10.3)
Chloride: 106 mmol/L (ref 101–111)
Creatinine, Ser: 0.58 mg/dL (ref 0.44–1.00)
GFR calc non Af Amer: >60 mL/min (ref >60)
Glucose, Bld: 117 mg/dL — ABNORMAL HIGH (ref 70–99)
Potassium: 4.2 mmol/L (ref 3.5–5.1)
Sodium: 138 mmol/L (ref 135–145)

## 2014-11-10 LAB — CBC
HCT: 39 % (ref 36.0–46.0)
HEMOGLOBIN: 12.8 g/dL (ref 12.0–15.0)
MCH: 30.5 pg (ref 26.0–34.0)
MCHC: 32.8 g/dL (ref 30.0–36.0)
MCV: 92.9 fL (ref 78.0–100.0)
Platelets: 195 10*3/uL (ref 150–400)
RBC: 4.2 MIL/uL (ref 3.87–5.11)
RDW: 14.5 % (ref 11.5–15.5)
WBC: 8.8 10*3/uL (ref 4.0–10.5)

## 2014-11-10 LAB — MAGNESIUM: Magnesium: 2.1 mg/dL (ref 1.7–2.4)

## 2014-11-10 MED ORDER — DIPHENHYDRAMINE HCL 25 MG PO CAPS
25.0000 mg | ORAL_CAPSULE | Freq: Four times a day (QID) | ORAL | Status: DC | PRN
  Administered 2014-11-10 (×2): 25 mg via ORAL
  Filled 2014-11-10 (×2): qty 1

## 2014-11-10 NOTE — Progress Notes (Signed)
Spoke with Nada BoozerLaura Ingold NP about increased QTC, stated to repeat EKG prior to 2200 dose and call if abnormal. Emelda Brothershristy Maalle Starrett RN

## 2014-11-10 NOTE — Progress Notes (Signed)
Medicare Important Message given? YES  (If response is "NO", the following Medicare IM given date fields will be blank)  Date Medicare IM given:  Medicare IM given by: Tomi BambergerGraves-Bigelow, Zoeya Gramajo 11-10-14 1135

## 2014-11-10 NOTE — Progress Notes (Signed)
Patient Name: Crystal RipperFrances I Witherspoon      SUBJECTIVE: breathingn a little better,  Allergic reaction to sheets  Past Medical History  Diagnosis Date  . Hypertension   . Asthma   . DOE (dyspnea on exertion)     wears O2 at night but has not had a sleep study  . Mitral regurgitation     mild  . Trace tricuspid regurgitation by prior echocardiogram   . Persistent atrial fibrillation   . Obesity   . Hyperthyroidism   . Family history of adverse reaction to anesthesia     "sister's heart stopped and had to be revived; think it was related to the demoral"  . Walking pneumonia ~ 1998  . On home oxygen therapy     "2L just at night" (11/08/2014)  . DJD (degenerative joint disease)   . Arthritis     "knees, hands" (11/08/2014)  . Chronic lower back pain   . Gout     "I've had it twice already this year" (11/08/2014)    Scheduled Meds:  Scheduled Meds: . apixaban  5 mg Oral BID  . calcium-vitamin D  1 tablet Oral Q breakfast  . dofetilide  500 mcg Oral BID  . fexofenadine  60 mg Oral Daily  . fluticasone  2 puff Inhalation BID  . furosemide  20 mg Oral Daily  . irbesartan  150 mg Oral Daily  . magnesium oxide  400 mg Oral Daily  . methimazole  5 mg Oral QHS  . omega-3 acid ethyl esters  1 g Oral Daily  . potassium chloride SA  40 mEq Oral Daily  . salmeterol  1 puff Inhalation Q12H  . sodium chloride  3 mL Intravenous Q12H   Continuous Infusions:  sodium chloride, albuterol, fluticasone, sodium chloride    PHYSICAL EXAM Filed Vitals:   11/09/14 1948 11/09/14 2008 11/10/14 0511 11/10/14 0713  BP:  153/43 144/44   Pulse: 68 63 53   Temp:  98 F (36.7 C) 97.8 F (36.6 C)   TempSrc:  Oral Oral   Resp: 18     Height:   5' 3.5" (1.613 m)   Weight:   170 lb 14.4 oz (77.52 kg)   SpO2: 95% 97% 97% 95%    Well developed and nourished in no acute distress HENT normal Neck supple with JVP-flat Some crackles Regular rate and rhythm, no murmurs or gallops Abd-soft  with active BS No Clubbing cyanosis edema Skin-warm and dry A & Oriented  Grossly normal sensory and motor function   TELEMETRY: Reviewed telemetry pt in  Afib>>NSR:  ECG  Sinus withQT 420 and QTc 440     Intake/Output Summary (Last 24 hours) at 11/10/14 0748 Last data filed at 11/09/14 1245  Gross per 24 hour  Intake    240 ml  Output      0 ml  Net    240 ml    LABS: Basic Metabolic Panel:  Recent Labs Lab 11/08/14 1023 11/09/14 0423 11/10/14 0422  NA 139 138 138  K 4.3 3.8 4.2  CL 106 105 106  CO2 28 23 24   GLUCOSE 113* 107* 117*  BUN 24* 19 12  CREATININE 0.61 0.61 0.58  CALCIUM 9.7 9.0 9.6  MG 1.8 2.0 2.1   Cardiac Enzymes:  ASSESSMENT AND PLAN:  Principal Problem:   Persistent atrial fibrillation Active Problems:   Snoring   Essential hypertension   Visit for monitoring Tikosyn therapy  Atrial fibrillation  Converted to sinus after 1st dse of dofetilide QTc change is concerning but still within range  Will review following 2 nd dose and determine subsequent dose   anticpate d/c in am   Signed, Sherryl MangesSteven Klein MD  11/10/2014

## 2014-11-10 NOTE — Progress Notes (Signed)
EKG done prior to 2000 dose of Tikosyn. QTC was 457. Dose given. Will continue to monitor.  Tera Helperooper, Baldomero Mirarchi E

## 2014-11-11 LAB — BASIC METABOLIC PANEL
ANION GAP: 9 (ref 5–15)
BUN: 10 mg/dL (ref 6–20)
CO2: 24 mmol/L (ref 22–32)
CREATININE: 0.61 mg/dL (ref 0.44–1.00)
Calcium: 9.5 mg/dL (ref 8.9–10.3)
Chloride: 106 mmol/L (ref 101–111)
GFR calc Af Amer: >60 mL/min (ref >60)
GFR calc non Af Amer: >60 mL/min (ref >60)
Glucose, Bld: 133 mg/dL — ABNORMAL HIGH (ref 70–99)
POTASSIUM: 4.2 mmol/L (ref 3.5–5.1)
Sodium: 139 mmol/L (ref 135–145)

## 2014-11-11 LAB — MAGNESIUM: MAGNESIUM: 2 mg/dL (ref 1.7–2.4)

## 2014-11-11 MED ORDER — MAGNESIUM OXIDE 400 (241.3 MG) MG PO TABS: 400.0000 mg | ORAL_TABLET | Freq: Every day | ORAL | Status: DC

## 2014-11-11 MED ORDER — DOFETILIDE 500 MCG PO CAPS: 500.0000 ug | ORAL_CAPSULE | Freq: Two times a day (BID) | ORAL | Status: DC

## 2014-11-11 NOTE — Progress Notes (Signed)
Patient Name: Crystal RipperFrances I Jost      SUBJECTIVE: breathing a little better,  Allergic reaction to sheets buit better  Past Medical History  Diagnosis Date  . Hypertension   . Asthma   . DOE (dyspnea on exertion)     wears O2 at night but has not had a sleep study  . Mitral regurgitation     mild  . Trace tricuspid regurgitation by prior echocardiogram   . Persistent atrial fibrillation   . Obesity   . Hyperthyroidism   . Family history of adverse reaction to anesthesia     "sister's heart stopped and had to be revived; think it was related to the demoral"  . Walking pneumonia ~ 1998  . On home oxygen therapy     "2L just at night" (11/08/2014)  . DJD (degenerative joint disease)   . Arthritis     "knees, hands" (11/08/2014)  . Chronic lower back pain   . Gout     "I've had it twice already this year" (11/08/2014)    Scheduled Meds:  Scheduled Meds: . apixaban  5 mg Oral BID  . calcium-vitamin D  1 tablet Oral Q breakfast  . dofetilide  500 mcg Oral BID  . fexofenadine  60 mg Oral Daily  . fluticasone  2 puff Inhalation BID  . furosemide  20 mg Oral Daily  . irbesartan  150 mg Oral Daily  . magnesium oxide  400 mg Oral Daily  . methimazole  5 mg Oral QHS  . omega-3 acid ethyl esters  1 g Oral Daily  . potassium chloride SA  40 mEq Oral Daily  . salmeterol  1 puff Inhalation Q12H  . sodium chloride  3 mL Intravenous Q12H   Continuous Infusions:  sodium chloride, albuterol, diphenhydrAMINE, fluticasone, sodium chloride    PHYSICAL EXAM Filed Vitals:   11/10/14 1430 11/10/14 2055 11/10/14 2058 11/11/14 0500  BP: 152/42 164/72  152/34  Pulse: 76 73  69  Temp: 97.9 F (36.6 C) 98.4 F (36.9 C)  98.1 F (36.7 C)  TempSrc: Oral Oral  Oral  Resp: 18     Height:    5' 3.5" (1.613 m)  Weight:    178 lb 9.6 oz (81.012 kg)  SpO2: 95% 96% 97% 95%    Well developed and nourished in no acute distress HENT normal Neck supple with JVP-flat Some  crackles Regular rate and rhythm, no murmurs or gallops Abd-soft with active BS No Clubbing cyanosis edema Skin-warm and dry A & Oriented  Grossly normal sensory and motor function   TELEMETRY: Reviewed telemetry pt in   NSR:  ECG  Sinus with  QTc 450    Intake/Output Summary (Last 24 hours) at 11/11/14 0709 Last data filed at 11/11/14 0500  Gross per 24 hour  Intake    720 ml  Output   1600 ml  Net   -880 ml    LABS: Basic Metabolic Panel:  Recent Labs Lab 11/08/14 1023 11/09/14 0423 11/10/14 0422 11/11/14 0309  NA 139 138 138 139  K 4.3 3.8 4.2 4.2  CL 106 105 106 106  CO2 28 23 24 24   GLUCOSE 113* 107* 117* 133*  BUN 24* 19 12 10   CREATININE 0.61 0.61 0.58 0.61  CALCIUM 9.7 9.0 9.6 9.5  MG 1.8 2.0 2.1 2.0   Cardiac Enzymes:  ASSESSMENT AND PLAN:  Principal Problem:   Persistent atrial fibrillation Active Problems:   Snoring  Essential hypertension   Visit for monitoring Tikosyn therapy   Atrial fibrillation   Discharge today followup tikosyn clinic per protocol ( next week)  F/u DC AFib clinic 4 weeks Please send dc summary to Caryl BisGanji  Signed, Sherryl MangesSteven Klein MD  11/11/2014

## 2014-11-11 NOTE — Discharge Summary (Signed)
Physician Discharge Summary      Cardiologist:  Jacinto HalimGanji Patient ID: Crystal Hess MRN: [id number] DOB/AGE: 79/15/1931 79 y.o.  Admit date: 11/08/2014 Discharge date: 11/11/2014  Admission Diagnoses:  Persistent Atrial fibrillation  Discharge Diagnoses:  Principal Problem:   Persistent atrial fibrillation Active Problems:   Snoring   Essential hypertension   Visit for monitoring Tikosyn therapy   Atrial fibrillation   Discharged Condition: stable  Hospital Course:   81 y.o. female who presented for Tikosyn load after electrophysiology evaluation 10/10/14 with Dr. Johney FrameAllred for evaluation of atrial fibrillation. She reports initially being diagnosed with atrial fibrillation 9/15 after presenting to Dr Julien NordmannWilsons office for a routine physical exam. She was unaware of afib at the time. In retrospect, she has had exertional fatigue for 3-4 years. She was referred to Dr Jacinto HalimGanji and she was initiated on xarelto. She did not tolerate this medicine (attributes gout and feeling "sick" to xarelto). She was switched to eliquis which she has tolerated. She underwent cardioversion 07/26/14. She quickly returned to atrial fibrillation. She thinks that she did have a little more energy in sinus rhythm. She was placed on flecainide and underwent repeat cardioversion 08/30/14. She again returned to atrial fibrillation within about a week. She has been persistently in afib since that time. She remains active. Her primary limitation is due to knee pain and gout. She wants to have L knee surgery soon. She has SOB with moderate activity. She has occasional ankle swelling at night.   She denies symptoms of palpitations, chest pain, orthopnea, PND, claudication, dizziness, presyncope, recent syncope, bleeding, or neurologic sequela. The patient is tolerating medications without difficulties and is otherwise without complaint today.   Dr. Johney FrameAllred made plans for her to be admitted for Tikosyn load after  long discussion with her in the office. Given prior sinus bradycardia, he felt he would avoid sotalol. Could consider amiodarone however, with hyperthyroid he felt he would like to avoid this. He felt her anticipated success rates with ablation would be 55-60% and risks are probably a little higher. Chads2vasc score is at least 4. Continue long term anticoagulation. She is doing well with eliquis. Other issues are sleep apnea and Dr. Verl DickerGanji's office will arrange.  Labs Na 139, K+4.3, BUN 24 and Cr. 0.61 Magnesium 1.8. She remains in a fib.   She was started on Tikosyn and initially QTc increased to 515 and then to 519, however, it then decrease to 457ms.   She converted to SR after the first dose.  Follow up next week and in 4 weeks in EP clinic.  The patient was seen by Dr. Graciela HusbandsKlein who felt she was stable for DC home.   Consults:  None  Significant Diagnostic Studies:  Serial EKGs completed.  Treatments: Tikosyn start  Discharge Exam: Blood pressure 152/34, pulse 69, temperature 98.1 F (36.7 C), temperature source Oral, resp. rate 18, height 5' 3.5" (1.613 m), weight 178 lb 9.6 oz (81.012 kg), SpO2 95 %.   Disposition: 01-Home or Self Care      Discharge Instructions    Diet - low sodium heart healthy    Complete by:  As directed      Increase activity slowly    Complete by:  As directed             Medication List    TAKE these medications        acetaminophen 650 MG CR tablet  Commonly known as:  TYLENOL  Take 650 mg by mouth  2 (two) times daily.     ALLEGRA ALLERGY PO  Take 1 tablet by mouth at bedtime.     allopurinol 100 MG tablet  Commonly known as:  ZYLOPRIM  Take 100 mg by mouth daily. To prevent gout     beclomethasone 80 MCG/ACT inhaler  Commonly known as:  QVAR  INHALE 1 PUFF BY MOUTH TWICE DAILY     CALCIUM 600 + MINERALS 600-200 MG-UNIT Tabs  Take 1 tablet by mouth daily.     clobetasol ointment 0.05 %  Commonly known as:  TEMOVATE  Apply 1  application topically 2 (two) times daily as needed.     dofetilide 500 MCG capsule  Commonly known as:  TIKOSYN  Take 1 capsule (500 mcg total) by mouth 2 (two) times daily.     ELIQUIS 5 MG Tabs tablet  Generic drug:  apixaban  Take 5 mg by mouth 2 (two) times daily.     Fish Oil 600 MG Caps  Take 1 capsule by mouth daily.     fluticasone 50 MCG/ACT nasal spray  Commonly known as:  FLONASE  Place 2 sprays into both nostrils daily as needed. ALLERGIES     FORADIL AEROLIZER 12 MCG capsule for inhaler  Generic drug:  formoterol  INHALE CONTENTS OF CAPSULE TWICE DAILY     furosemide 20 MG tablet  Commonly known as:  LASIX  Take 1 tablet (20 mg total) by mouth daily.     magnesium oxide 400 (241.3 MG) MG tablet  Commonly known as:  MAG-OX  Take 1 tablet (400 mg total) by mouth daily.     methimazole 5 MG tablet  Commonly known as:  TAPAZOLE  Take 5 mg by mouth at bedtime.     potassium chloride SA 20 MEQ tablet  Commonly known as:  K-DUR,KLOR-CON  Take 1 tablet (20 mEq total) by mouth daily.     prednisoLONE acetate 1 % ophthalmic suspension  Commonly known as:  PRED FORTE  Place 1 drop into both eyes 3 (three) times daily as needed (eye infection).     PROVENTIL HFA 108 (90 BASE) MCG/ACT inhaler  Generic drug:  albuterol  INHALE 2 PUFFS INTO LUNGS EVERY 6 HOURS AS NEEDED FOR WHEEZING AND SHORTNESS OF BREATH     valsartan 160 MG tablet  Commonly known as:  DIOVAN  Take 1 tablet (160 mg total) by mouth daily.       Follow-up Information    Follow up with CARROLL,DONNA, NP On 11/18/2014.   Specialty:  Nurse Practitioner   Why:  10:30 AM   Contact information:   [address] [place] [address] [phone]       Follow up with Hillis RangeJames Allred, MD On 12/08/2014.   Specialty:  Cardiology   Why:  10:30 AM   Contact information:   [address] [place] [address] [phone]      Greater than 30 minutes was spent completing the patient's  discharge.    SignedWilburt Finlay: Bon Dowis, PAC 11/11/2014, 8:17 AM

## 2014-11-11 NOTE — Progress Notes (Signed)
Discharge review done with patient.   Patient acknowledged understanding of information provided.  Seven day supply of Tikosyn given per MD's order.  Patient is stable and discharged home with family member. Ephriam KnucklesKome, Robby Bulkley Senze

## 2014-11-18 ENCOUNTER — Ambulatory Visit (HOSPITAL_COMMUNITY)
Admit: 2014-11-18 | Discharge: 2014-11-18 | Disposition: A | Discharge status: Home / Self Care | Payer: Medicare Other | Source: Ambulatory Visit | Attending: Nurse Practitioner | Admitting: Nurse Practitioner

## 2014-11-18 ENCOUNTER — Encounter (HOSPITAL_COMMUNITY): Payer: Self-pay | Admitting: Nurse Practitioner

## 2014-11-18 VITALS — BP 120/60 | HR 62 | Ht 63.5 in | Wt 171.8 lb

## 2014-11-18 DIAGNOSIS — I481 Persistent atrial fibrillation: Secondary | ICD-10-CM

## 2014-11-18 DIAGNOSIS — I4819 Other persistent atrial fibrillation: Secondary | ICD-10-CM

## 2014-11-18 NOTE — Progress Notes (Signed)
Patient ID: Alan RipperFrances I Ekman, female   DOB: October 30, 1929, 81 y.o.   MRN: [id number]     Primary Care Physician: Pamelia HoitWILSON,FRED HENRY, MD [facility] Electrophysiologist: Dr. Johney FrameAllred Primary Cardiologist: Dr. Lyla SonGanji   Almee I Sabino Niemannaschal is a [age] y.o. female with a h/o Tikosyn load after electrophysiology evaluation 10/10/14 with Dr. Johney FrameAllred for evaluation of atrial fibrillation. She reports initially being diagnosed with atrial fibrillation 9/15 after presenting to Dr Julien NordmannWilsons office for a routine physical exam. She was unaware of afib at the time. In retrospect, she has had exertional fatigue for 3-4 years. She was referred to Dr Jacinto HalimGanji and she was initiated on xarelto. She did not tolerate this medicine (attributes gout and feeling "sick" to xarelto). She was switched to eliquis which she has tolerated. She underwent cardioversion 07/26/14. She quickly returned to atrial fibrillation. She thinks that she did have a little more energy in sinus rhythm. She was placed on flecainide and underwent repeat cardioversion 08/30/14. She again returned to atrial fibrillation within about a week. She has been persistently in afib since that time. She remains active. Her primary limitation is due to knee pain and gout. She wants to have L knee surgery soon. She has SOB Dr. Johney FrameAllred made plans for her to be admitted for Tikosyn load after long discussion with her in the office. Given prior sinus bradycardia, he felt he would avoid sotalol. Could consider amiodarone however, with hyperthyroid he felt he would like to avoid this. He felt her anticipated success rates with ablation would be 55-60% and risks are probably a little higher. Chads2vasc score is at least 4.Tolerating anticoagulation. She is doing well with eliquis. Other issues are sleep apnea and Dr. Verl DickerGanji's office will arrange. She was started on Tikosyn and initially QTc increased to 515 and then to 519, however, it then  decrease to 457ms. he patient was seen by Dr. Graciela HusbandsKlein who felt she was stable for DC home.      Since d/c , she has been doing well without any awareness of afib. She feels like she is tolerating the drug well. Aware of importance of regular taking of medication.   Today, she denies symptoms of palpitations, chest pain, shortness of breath, orthopnea, PND, lower extremity edema, dizziness, presyncope, syncope, or neurologic sequela. The patient is tolerating medications without difficulties and is otherwise without complaint today.   Past Medical History  Diagnosis Date  . Hypertension   . Asthma   . DOE (dyspnea on exertion)     wears O2 at night but has not had a sleep study  . Mitral regurgitation     mild  . Trace tricuspid regurgitation by prior echocardiogram   . Persistent atrial fibrillation   . Obesity   . Hyperthyroidism   . Family history of adverse reaction to anesthesia     "sister's heart stopped and had to be revived; think it was related to the demoral"  . Walking pneumonia ~ 1998  . On home oxygen therapy     "2L just at night" (11/08/2014)  . DJD (degenerative joint disease)   . Arthritis     "knees, hands" (11/08/2014)  . Chronic lower back pain   . Gout     "I've had it twice already this year" (11/08/2014)   Past Surgical History  Procedure Laterality Date  . Excisional hemorrhoidectomy  ~ 1960  . Ovarian cyst removal Right 1963    "gangrene"  . Shoulder arthroscopy Left ~ 2008  bone spur  . Knee cartilage surgery Right 1976    "cut it open"  . Tonsillectomy    . Cardioversion N/A 07/26/2014    Procedure: CARDIOVERSION;  Surgeon: Pamella Pert, MD;  Location: [facility] ENDOSCOPY;  Service: Cardiovascular;  Laterality: N/A;  . Cardioversion N/A 08/30/2014    Procedure: CARDIOVERSION;  Surgeon: Pamella Pert, MD;  Location: [facility] ENDOSCOPY;  Service: Cardiovascular;  Laterality: N/A;  . Cataract extraction w/ intraocular lens  implant, bilateral Bilateral ~ 2006      Current Outpatient Prescriptions  Medication Sig Dispense Refill  . acetaminophen (TYLENOL) 650 MG CR tablet Take 650 mg by mouth 2 (two) times daily.    Marland Kitchen allopurinol (ZYLOPRIM) 100 MG tablet Take 100 mg by mouth daily. To prevent gout  1  . beclomethasone (QVAR) 80 MCG/ACT inhaler INHALE 1 PUFF BY MOUTH TWICE DAILY (Patient taking differently: INHALE 1 PUFF INTO THE LUNGS TWICE DAILY) 8.7 g 6  . Calcium Carbonate-Vit D-Min (CALCIUM 600 + MINERALS) 600-200 MG-UNIT TABS Take 1 tablet by mouth daily.    Marland Kitchen dofetilide (TIKOSYN) 500 MCG capsule Take 1 capsule (500 mcg total) by mouth 2 (two) times daily. 14 capsule 0  . ELIQUIS 5 MG TABS tablet Take 5 mg by mouth 2 (two) times daily.  5  . Fexofenadine HCl (ALLEGRA ALLERGY PO) Take 1 tablet by mouth at bedtime.     . fluticasone (FLONASE) 50 MCG/ACT nasal spray Place 2 sprays into both nostrils daily as needed. ALLERGIES  1  . FORADIL AEROLIZER 12 MCG capsule for inhaler INHALE CONTENTS OF CAPSULE TWICE DAILY (Patient taking differently: INHALE CONTENTS OF CAPSULE TWICE DAILY AS NEEDED FOR SHORTNESS OF BREATH OR WHEEZING) 60 capsule 0  . furosemide (LASIX) 20 MG tablet Take 1 tablet (20 mg total) by mouth daily. 30 tablet 3  . magnesium oxide (MAG-OX) 400 (241.3 MG) MG tablet Take 1 tablet (400 mg total) by mouth daily. 30 tablet 5  . methimazole (TAPAZOLE) 5 MG tablet Take 5 mg by mouth at bedtime.   2  . Omega-3 Fatty Acids (FISH OIL) 600 MG CAPS Take 1 capsule by mouth daily.    . potassium chloride SA (K-DUR,KLOR-CON) 20 MEQ tablet Take 1 tablet (20 mEq total) by mouth daily. 30 tablet 3  . PROVENTIL HFA 108 (90 BASE) MCG/ACT inhaler INHALE 2 PUFFS INTO LUNGS EVERY 6 HOURS AS NEEDED FOR WHEEZING AND SHORTNESS OF BREATH 6.7 g 2  . valsartan (DIOVAN) 160 MG tablet Take 1 tablet (160 mg total) by mouth daily. 30 tablet 6  . clobetasol ointment (TEMOVATE) 0.05 % Apply 1 application topically 2 (two) times daily as needed.     . prednisoLONE  acetate (PRED FORTE) 1 % ophthalmic suspension Place 1 drop into both eyes 3 (three) times daily as needed (eye infection).   0   No current facility-administered medications for this encounter.    Allergies  Allergen Reactions  . Codeine Nausea Only  . Meperidine And Related     "have hallucinations; get combative; I don't want that"  . Methotrexate Derivatives Other (See Comments)    Restless  . Vasculera [Nutritional Supplements]     History   Social History  . Marital Status: Single    Spouse Name: N/A  . Number of Children: 0  . Years of Education: N/A   Occupational History  . Retired Engineer, site    Social History Main Topics  . Smoking status: Never Smoker   . Smokeless tobacco:  Never Used  . Alcohol Use: No  . Drug Use: No  . Sexual Activity: Not on file   Other Topics Concern  . Not on file   Social History Narrative   [place].   Nephew and his wife live with her.      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 Lymphoma Mother   . Lung cancer Father     smoker  . Emphysema Brother     ROS- All systems are reviewed and negative except as per the HPI above  Physical Exam: Filed Vitals:   11/18/14 1045  BP: 120/60  Pulse: 62  Height: 5' 3.5" (1.613 m)  Weight: 171 lb 12.8 oz (77.928 kg)    GEN- The patient is well appearing, alert and oriented x 3 today.   Head- normocephalic, atraumatic Eyes-  Sclera clear, conjunctiva pink Ears- hearing intact Oropharynx- clear Neck- supple, no JVP Lymph- no cervical lymphadenopathy Lungs- Clear to ausculation bilaterally, normal work of breathing Heart- Regular rate and rhythm, no murmurs, rubs or gallops, PMI not laterally displaced GI- soft, NT, ND, + BS Extremities- no clubbing, cyanosis, or edema MS- no significant deformity or atrophy Skin- no rash or lesion Psych- euthymic mood, full  affect Neuro- strength and sensation are intact  EKG- Shows SR with PAC's at 62 bpm, Pr int 202 ms, QRS 64 ms and QTc at 454 ms Labs: 5/6  Mag 1.7, bmet with Kt at 4.2 and normal renal function.   Assessment and Plan:  1. Persistent symptomatic Afib with recent Tikosyn loading Maintaining SR/ QTc acceptable Continue tikosyn at current dose. Recent labs in range.  2. This patients CHA2DS2-VASc Score and unadjusted Ischemic Stroke Rate (% per year) is equal to 4.8 % stroke rate/year from a score of 4   Above score calculated as 1 point each if present [CHF, HTN, DM, Vascular=MI/PAD/Aortic Plaque, Age if 65-74, or Female] Above score calculated as 2 points each if present [Age > 75, or Stroke/TIA/TE]  Continue DOAC  3. HTN Stable today.  F/u with Dr. Graciela HusbandsKlein at pt request  6/14

## 2014-11-18 NOTE — Patient Instructions (Signed)
Follow up with Dr. Graciela HusbandsKlein June 14th 1045

## 2014-12-08 ENCOUNTER — Encounter: Payer: Medicare Other | Admitting: Internal Medicine

## 2014-12-20 ENCOUNTER — Encounter: Payer: Self-pay | Admitting: Internal Medicine

## 2014-12-20 ENCOUNTER — Ambulatory Visit (INDEPENDENT_AMBULATORY_CARE_PROVIDER_SITE_OTHER): Payer: Medicare Other | Admitting: Internal Medicine

## 2014-12-20 VITALS — BP 116/70 | HR 63 | Ht 63.5 in | Wt 171.6 lb

## 2014-12-20 DIAGNOSIS — I4819 Other persistent atrial fibrillation: Secondary | ICD-10-CM

## 2014-12-20 DIAGNOSIS — I10 Essential (primary) hypertension: Secondary | ICD-10-CM

## 2014-12-20 DIAGNOSIS — I481 Persistent atrial fibrillation: Secondary | ICD-10-CM | POA: Diagnosis not present

## 2014-12-20 NOTE — Progress Notes (Signed)
Patient Care Team: Barbie Banner, MD as PCP - General (Family Medicine)   HPI  Crystal Hess is a 81 y.o. female With atrial fibrillation recently admitted for dofetilide loading.  She had initially been identified with atrial fibrillation 9/15. She underwent cardioversion 1/16 with early reversion to atrial fibrillation.  Because of her propensity to bradycardia she was started on dofetilide and underwent cardioversion 5/ 16  She feels considerably better   in sinus rhythm  She anticipates eye surgery for lacrimal duct occlusion and knee surgery.  Past Medical History  Diagnosis Date  . Hypertension   . Asthma   . DOE (dyspnea on exertion)     wears O2 at night but has not had a sleep study  . Mitral regurgitation     mild  . Trace tricuspid regurgitation by prior echocardiogram   . Persistent atrial fibrillation   . Obesity   . Hyperthyroidism   . Family history of adverse reaction to anesthesia     "sister's heart stopped and had to be revived; think it was related to the demoral"  . Walking pneumonia ~ 1998  . On home oxygen therapy     "2L just at night" (11/08/2014)  . DJD (degenerative joint disease)   . Arthritis     "knees, hands" (11/08/2014)  . Chronic lower back pain   . Gout     "I've had it twice already this year" (11/08/2014)    Past Surgical History  Procedure Laterality Date  . Excisional hemorrhoidectomy  ~ 1960  . Ovarian cyst removal Right 1963    "gangrene"  . Shoulder arthroscopy Left ~ 2008    bone spur  . Knee cartilage surgery Right 1976    "cut it open"  . Tonsillectomy    . Cardioversion N/A 07/26/2014    Procedure: CARDIOVERSION;  Surgeon: Pamella Pert, MD;  Location: [facility] ENDOSCOPY;  Service: Cardiovascular;  Laterality: N/A;  . Cardioversion N/A 08/30/2014    Procedure: CARDIOVERSION;  Surgeon: Pamella Pert, MD;  Location: [facility] ENDOSCOPY;  Service: Cardiovascular;  Laterality: N/A;  . Cataract extraction w/ intraocular  lens  implant, bilateral Bilateral ~ 2006    Current Outpatient Prescriptions  Medication Sig Dispense Refill  . acetaminophen (TYLENOL) 650 MG CR tablet Take 650 mg by mouth 2 (two) times daily.    Marland Kitchen allopurinol (ZYLOPRIM) 100 MG tablet Take 100 mg by mouth daily. To prevent gout  1  . beclomethasone (QVAR) 80 MCG/ACT inhaler INHALE 1 PUFF BY MOUTH TWICE DAILY (Patient taking differently: INHALE 1 PUFF INTO THE LUNGS TWICE DAILY) 8.7 g 6  . Calcium Carbonate-Vit D-Min (CALCIUM 600 + MINERALS) 600-200 MG-UNIT TABS Take 1 tablet by mouth daily.    . clobetasol ointment (TEMOVATE) 0.05 % Apply 1 application topically 2 (two) times daily as needed.     . dofetilide (TIKOSYN) 500 MCG capsule Take 1 capsule (500 mcg total) by mouth 2 (two) times daily. 14 capsule 0  . ELIQUIS 5 MG TABS tablet Take 5 mg by mouth 2 (two) times daily.  5  . Fexofenadine HCl (ALLEGRA ALLERGY PO) Take 1 tablet by mouth at bedtime.     . fluticasone (FLONASE) 50 MCG/ACT nasal spray Place 2 sprays into both nostrils daily as needed. ALLERGIES  1  . FORADIL AEROLIZER 12 MCG capsule for inhaler INHALE CONTENTS OF CAPSULE TWICE DAILY (Patient taking differently: INHALE CONTENTS OF CAPSULE TWICE DAILY AS NEEDED FOR SHORTNESS OF BREATH OR  WHEEZING) 60 capsule 0  . furosemide (LASIX) 20 MG tablet Take 1 tablet (20 mg total) by mouth daily. 30 tablet 3  . magnesium oxide (MAG-OX) 400 (241.3 MG) MG tablet Take 1 tablet (400 mg total) by mouth daily. 30 tablet 5  . methimazole (TAPAZOLE) 5 MG tablet Take 5 mg by mouth at bedtime.   2  . Omega-3 Fatty Acids (FISH OIL) 600 MG CAPS Take 1 capsule by mouth daily.    . potassium chloride SA (K-DUR,KLOR-CON) 20 MEQ tablet Take 1 tablet (20 mEq total) by mouth daily. 30 tablet 3  . prednisoLONE acetate (PRED FORTE) 1 % ophthalmic suspension Place 1 drop into both eyes 3 (three) times daily as needed (eye infection).   0  . PROVENTIL HFA 108 (90 BASE) MCG/ACT inhaler INHALE 2 PUFFS INTO  LUNGS EVERY 6 HOURS AS NEEDED FOR WHEEZING AND SHORTNESS OF BREATH 6.7 g 2  . valsartan (DIOVAN) 160 MG tablet Take 1 tablet (160 mg total) by mouth daily. 30 tablet 6   No current facility-administered medications for this visit.    Allergies  Allergen Reactions  . Codeine Nausea Only  . Meperidine And Related     "have hallucinations; get combative; I don't want that"  . Methotrexate Derivatives Other (See Comments)    Restless  . Vasculera [Nutritional Supplements]     Review of Systems negative except from HPI and PMH  Physical Exam BP 116/70 mmHg  Pulse 63  Ht 5' 3.5" (1.613 m)  Wt 171 lb 9.6 oz (77.837 kg)  BMI 29.92 kg/m2 Well developed and well nourished in no acute distress HENT normal E scleral and icterus clear Neck Supple JVP flat; carotids brisk and full Clear to ausculation  Regular rate and rhythm, no murmurs gallops or rub Soft with active bowel sounds No clubbing cyanosis none Edema Alert and oriented, grossly normal motor and sensory function Skin Warm and Dry  ECG today demonstrated sinus rhythm at 63 intervals 22/07/43 Otherwise normal  Assessment and  Plan  Paroxysmal atrial fibrillation  HFpEF  Preoperative evaluation  She is tolerating dofetilide well. She is maintaining sinus rhythm. Laboratories were normal 5/16. ECG is normal today.  From a surgical perspective, she is an acceptable risk. Anticoagulation would be discontinued per guidelines typically for 24-48 hours for [facility]  We will be available periprocedurally  as needed.   We wiil see her in 6 months in AFib clinic and I will see in 17 motnhs

## 2014-12-20 NOTE — Patient Instructions (Signed)
Medication Instructions:  Your physician recommends that you continue on your current medications as directed. Please refer to the Current Medication list given to you today.   Labwork: NONE  Testing/Procedures: NONE   Follow-Up: Your physician wants you to follow-up in: 6 months with Rudi Coco, NP in [facility]. You will receive a reminder letter in the mail two months in advance. If you don't receive a letter, please call our office to schedule the follow-up appointment.  Your physician wants you to follow-up in: 12 months with Dr. Graciela Husbands. You will receive a reminder letter in the mail two months in advance. If you don't receive a letter, please call our office to schedule the follow-up appointment.   Any Other Special Instructions Will Be Listed Below (If Applicable).

## 2015-01-02 ENCOUNTER — Telehealth: Payer: Self-pay | Admitting: Internal Medicine

## 2015-01-02 NOTE — Telephone Encounter (Signed)
New Message   Request for surgical clearance:  1. What type of surgery is being performed? Knee surgery  2. When is this surgery scheduled? July 25,2016  3. Are there any medications that need to be held prior to surgery and how long? Blood thinner for 2 days prier to surgery  Name of physician performing surgery? Dr. Valentina GuLucy  4. What is your office phone and fax number? [phone]

## 2015-01-02 NOTE — Telephone Encounter (Signed)
Calling stating that she thinks Dr. Graciela HusbandsKlein cleared her but thinks might have been sent to Dr. Lequita HaltAluisio but needs to go to Dr. Valentina GuLucy.  Will forward to Dr. Graciela HusbandsKlein and Richardine ServiceSherry Price,RN

## 2015-01-03 NOTE — Telephone Encounter (Signed)
Informed patient to have Dr. Rogue JuryLucy's office fax clearance request to our office and we will complete it. She states she will call their office.

## 2015-01-11 ENCOUNTER — Ambulatory Visit (INDEPENDENT_AMBULATORY_CARE_PROVIDER_SITE_OTHER): Payer: Medicare Other | Admitting: Podiatry

## 2015-01-11 ENCOUNTER — Telehealth: Payer: Self-pay | Admitting: Internal Medicine

## 2015-01-11 ENCOUNTER — Other Ambulatory Visit: Payer: Self-pay | Admitting: *Deleted

## 2015-01-11 VITALS — BP 146/66 | HR 62 | Resp 12

## 2015-01-11 DIAGNOSIS — L600 Ingrowing nail: Secondary | ICD-10-CM

## 2015-01-11 DIAGNOSIS — B351 Tinea unguium: Secondary | ICD-10-CM | POA: Diagnosis not present

## 2015-01-11 MED ORDER — DOFETILIDE 500 MCG PO CAPS: 500.0000 ug | ORAL_CAPSULE | Freq: Two times a day (BID) | ORAL | Status: DC

## 2015-01-11 NOTE — Progress Notes (Signed)
Patient ID: Crystal Hess, female   DOB: 05-24-1930, 81 y.o.   MRN: [id number] I want the ingrown on my Right 2nd toe nail taken care of today.   This patient presents with pain on the inside second toe right foot.  She states she is in severe pain.  She previously had surgery performed by myself for the permanent correction of her big toes. Objective: Review of past medical history, medications, social history and allergies were performed.  Vascular: Dorsalis pedis and posterior tibial pulses were palpable B/L, capillary refill was  WNL B/L, temperature gradient was WNL B/L   Skin:  No signs of symptoms of infection or ulcers on both feet  Nails: Marked incurvation medial border second toe right foot.Patient has thick disfigured discolored nails right foot.  Sensory: Crystal PerchSemmes Weinstein monifilament WNL   Orthopedic: Orthopedic evaluation demonstrates all joints distal t ankle have full ROM without crepitus, muscle power WNL B/L  Ingrown Nail second right  Onchomycosis second right  P.  Excision of nail and matrix medial border right foot under local anesthesia.  Neosporin/DSD.  Home instructions given.

## 2015-01-11 NOTE — Telephone Encounter (Signed)
New message      Calling to see if we received a clearance form from Dr Valentina GuLucy for knee replacement.  They faxed 2 forms to us.  Please call

## 2015-01-11 NOTE — Telephone Encounter (Signed)
Informed that I have not received a fax from their office for clearance request.  Patient will call them again.  Gave her fax number [phone] to have them fax request to. She is agreeable to plan.

## 2015-01-18 ENCOUNTER — Ambulatory Visit (INDEPENDENT_AMBULATORY_CARE_PROVIDER_SITE_OTHER): Payer: Medicare Other | Admitting: Podiatry

## 2015-01-18 ENCOUNTER — Encounter: Payer: Self-pay | Admitting: Podiatry

## 2015-01-18 VITALS — BP 132/55 | HR 50 | Resp 16

## 2015-01-18 DIAGNOSIS — Z09 Encounter for follow-up examination after completed treatment for conditions other than malignant neoplasm: Secondary | ICD-10-CM

## 2015-01-18 NOTE — Progress Notes (Signed)
Subjective:     Patient ID: Crystal Hess, female   DOB: June 19, 1930, 81 y.o.   MRN: [id number]  HPIThis patient presents following nail surgery second toe right foot.  She has been soaking her foot and bandaging her toe as directed.  She says she has no pain or discomfort second toe right foot.   Review of Systems     Objective:   Physical Exam Objective: Review of past medical history, medications, social history and allergies were performed.  Vascular: Dorsalis pedis and posterior tibial pulses were palpable B/L, capillary refill was  WNL B/L, temperature gradient was WNL B/L   Skin:  No signs of symptoms of infection or ulcers on both feet  Nails: healing at surgical site with no evidence of redness or swelling or drainage.  Sensory: Crystal Hess WNL   Orthopedic: Orthopedic evaluation demonstrates all joints distal t ankle have full ROM without crepitus, muscle power WNL B/L     Assessment:     S/p nail surgery     Plan:     ROV.  Home instructions given. Patient desires to have surgery on second toe left foot.  She was told to make an appointment.

## 2015-01-19 ENCOUNTER — Telehealth: Payer: Self-pay | Admitting: Internal Medicine

## 2015-01-19 NOTE — Telephone Encounter (Signed)
New message      Calling to let you know that Dr Rogue JuryLucy's office is faxing a clearance form for pt to have surgery.  Please let her know when the form has been faxed back

## 2015-01-20 NOTE — Telephone Encounter (Signed)
Informed patient that I still have not received clearance request from their office. I will contact their office personally, to help with this, and work on getting request from them. She would like me to call her back once clearance has been given by Dr. Graciela HusbandsKlein.  I have left a message with Dr. Jeannett SeniorStephen Lucey's office at [phone] to obtain clearance form.

## 2015-01-20 NOTE — Telephone Encounter (Signed)
Dr. Tobin ChadLucey's office returned my call -- they will fax clearance request on Monday 7/18.

## 2015-01-24 ENCOUNTER — Telehealth: Payer: Self-pay | Admitting: Internal Medicine

## 2015-01-24 NOTE — Telephone Encounter (Signed)
Informed patient that I spoke with Dr. Rogue JuryLucy's office on Friday and they stated they would fax clearance request yesterday.  Explained that I have not recevied request and I will contact their office again in attempt to obtain paperwork.   Patient is aware I will call her once received and completed.  She is very thankful for helping with this.

## 2015-01-24 NOTE — Telephone Encounter (Addendum)
Lisa at Dr. Tobin ChadLucey's office informs me that they have faxed this 3 times. Asked her to fax to our device clinic fax number.  She will fax now.

## 2015-01-24 NOTE — Telephone Encounter (Addendum)
Left message for Beverely RisenLisa Thorton at Dr. Tobin ChadLucey's office advising we have not received clearance request yet.

## 2015-01-24 NOTE — Telephone Encounter (Signed)
New message     Pt is calling regarding surgical clearance.  Pt wants to know if the paperwork for clearance has been sent back to Dr. Tobin Hess's office. Please call to discuss

## 2015-01-24 NOTE — Telephone Encounter (Signed)
Left detailed message on home phone informing patient that cardiac clearance faxed to Dr. Tobin ChadLucey's. Stop Eliquis 48 hours prior to procedure. Advised to call if further questions.

## 2015-01-25 ENCOUNTER — Encounter: Payer: Self-pay | Admitting: Podiatry

## 2015-01-25 ENCOUNTER — Ambulatory Visit (INDEPENDENT_AMBULATORY_CARE_PROVIDER_SITE_OTHER): Payer: Medicare Other | Admitting: Podiatry

## 2015-01-25 VITALS — BP 157/66 | HR 52 | Resp 17

## 2015-01-25 DIAGNOSIS — L600 Ingrowing nail: Secondary | ICD-10-CM

## 2015-01-25 DIAGNOSIS — B351 Tinea unguium: Secondary | ICD-10-CM

## 2015-01-25 NOTE — Addendum Note (Signed)
Addended by: Jodene NamWARD, Lockie Bothun L on: 01/25/2015 02:17 PM   Modules accepted: Orders, Medications

## 2015-01-25 NOTE — Progress Notes (Signed)
Subjective:     Patient ID: Crystal RipperFrances I Baray, female   DOB: 11/16/1929, 81 y.o.   MRN: [id number]  HPIThis patient presents to the office for nail surgery on her second toe left foot.  She had nail surgery second toe right foot which has healed..  She says the inside border second toe left foot is painful walking and wearing shoes.  She desires correction of her toenail.   Review of Systems     Objective:   Physical Exam GENERAL APPEARANCE: Alert, conversant. Appropriately groomed. No acute distress.  VASCULAR: Pedal pulses palpable at 2/4 DP and PT bilateral.  Capillary refill time is immediate to all digits,  Proximal to distal cooling it warm to warm.  Digital hair growth is present bilateral  NEUROLOGIC: sensation is intact epicritically and protectively to 5.07 monofilament at 5/5 sites bilateral.  Light touch is intact bilateral, vibratory sensation intact bilateral, achilles tendon reflex is intact bilateral.  MUSCULOSKELETAL: acceptable muscle strength, tone and stability bilateral.  Intrinsic muscluature intact bilateral.  Rectus appearance of foot and digits noted bilateral.   DERMATOLOGIC: skin color, texture, and turgor are within normal limits.  No preulcerative lesions or ulcers  are seen, no interdigital maceration noted.  No open lesions present.  Digital nails are asymptomatic. No drainage noted. NAIL  She has marked incurvation medial border left second toe.  Her second toe right has healed with no pain or drainage.     Assessment:     Ingrowing Toenail second toe left foot.     Plan:     ROV  Nail surgery second toe left foot.   Treatment options and alternatives discussed.  Recommended permanent phenol matrixectomy and patient agreed. Second toe left foot was prepped with alcohol and a toe block of 3cc of 2% lidocaine plain was administered in a .  The toe was then prepped with betadine solution .  The offending nail border was then excised and matrix tissue exposed.   Phenol was then applied to the matrix tissue followed by an alcohol wash.  Antibiotic ointment and a dry sterile dressing was applied.  The patient was dispensed instructions for aftercare.

## 2015-01-26 ENCOUNTER — Other Ambulatory Visit: Payer: Self-pay | Admitting: Orthopedic Surgery

## 2015-01-30 ENCOUNTER — Encounter (HOSPITAL_COMMUNITY)
Admission: RE | Admit: 2015-01-30 | Discharge: 2015-01-30 | Disposition: A | Discharge status: Home / Self Care | Payer: Medicare Other | Source: Ambulatory Visit | Attending: Orthopedic Surgery | Admitting: Orthopedic Surgery

## 2015-01-30 ENCOUNTER — Encounter (HOSPITAL_COMMUNITY): Payer: Self-pay

## 2015-01-30 DIAGNOSIS — M1711 Unilateral primary osteoarthritis, right knee: Secondary | ICD-10-CM | POA: Diagnosis not present

## 2015-01-30 DIAGNOSIS — Z01812 Encounter for preprocedural laboratory examination: Secondary | ICD-10-CM | POA: Insufficient documentation

## 2015-01-30 HISTORY — DX: Cardiac arrhythmia, unspecified: I49.9

## 2015-01-30 HISTORY — DX: Anxiety disorder, unspecified: F41.9

## 2015-01-30 LAB — CBC WITH DIFFERENTIAL/PLATELET
BASOS ABS: 0 10*3/uL (ref 0.0–0.1)
BASOS PCT: 0 % (ref 0–1)
EOS ABS: 0.3 10*3/uL (ref 0.0–0.7)
Eosinophils Relative: 4 % (ref 0–5)
HCT: 41.3 % (ref 36.0–46.0)
HEMOGLOBIN: 14 g/dL (ref 12.0–15.0)
LYMPHS ABS: 3.5 10*3/uL (ref 0.7–4.0)
Lymphocytes Relative: 48 % — ABNORMAL HIGH (ref 12–46)
MCH: 31.4 pg (ref 26.0–34.0)
MCHC: 33.9 g/dL (ref 30.0–36.0)
MCV: 92.6 fL (ref 78.0–100.0)
Monocytes Absolute: 0.6 10*3/uL (ref 0.1–1.0)
Monocytes Relative: 8 % (ref 3–12)
NEUTROS PCT: 40 % — AB (ref 43–77)
Neutro Abs: 3 10*3/uL (ref 1.7–7.7)
Platelets: 183 10*3/uL (ref 150–400)
RBC: 4.46 MIL/uL (ref 3.87–5.11)
RDW: 13.9 % (ref 11.5–15.5)
WBC: 7.3 10*3/uL (ref 4.0–10.5)

## 2015-01-30 LAB — URINALYSIS, ROUTINE W REFLEX MICROSCOPIC
BILIRUBIN URINE: NEGATIVE
Glucose, UA: NEGATIVE mg/dL
Hgb urine dipstick: NEGATIVE
Ketones, ur: NEGATIVE mg/dL
NITRITE: NEGATIVE
PROTEIN: NEGATIVE mg/dL
SPECIFIC GRAVITY, URINE: 1.024 (ref 1.005–1.030)
Urobilinogen, UA: 0.2 mg/dL (ref 0.0–1.0)
pH: 6 (ref 5.0–8.0)

## 2015-01-30 LAB — URINE MICROSCOPIC-ADD ON

## 2015-01-30 LAB — SURGICAL PCR SCREEN
MRSA, PCR: NEGATIVE
Staphylococcus aureus: NEGATIVE

## 2015-01-30 LAB — COMPREHENSIVE METABOLIC PANEL
ALBUMIN: 3.5 g/dL (ref 3.5–5.0)
ALT: 16 U/L (ref 14–54)
AST: 17 U/L (ref 15–41)
Alkaline Phosphatase: 77 U/L (ref 38–126)
Anion gap: 6 (ref 5–15)
BUN: 19 mg/dL (ref 6–20)
CO2: 23 mmol/L (ref 22–32)
Calcium: 9.9 mg/dL (ref 8.9–10.3)
Chloride: 110 mmol/L (ref 101–111)
Creatinine, Ser: 0.43 mg/dL — ABNORMAL LOW (ref 0.44–1.00)
GFR calc Af Amer: >60 mL/min (ref >60)
GFR calc non Af Amer: >60 mL/min (ref >60)
Glucose, Bld: 130 mg/dL — ABNORMAL HIGH (ref 65–99)
Potassium: 4.1 mmol/L (ref 3.5–5.1)
SODIUM: 139 mmol/L (ref 135–145)
TOTAL PROTEIN: 6.7 g/dL (ref 6.5–8.1)
Total Bilirubin: 0.4 mg/dL (ref 0.3–1.2)

## 2015-01-30 LAB — PROTIME-INR
INR: 1.23 (ref 0.00–1.49)
PROTHROMBIN TIME: 15.6 s — AB (ref 11.6–15.2)

## 2015-01-30 LAB — APTT: APTT: 34 s (ref 24–37)

## 2015-01-30 MED ORDER — SODIUM CHLORIDE 0.9 % IV SOLN: INTRAVENOUS | Status: DC

## 2015-01-30 NOTE — Progress Notes (Signed)
Pt requests cotton sheets on bed. Gets rash for anything other than cotton

## 2015-01-30 NOTE — Pre-Procedure Instructions (Signed)
    Crystal Hess  01/30/2015      [facility] - [place], [place] - [address] [place] [address] Phone: [phone] Fax: [phone]    Your procedure is scheduled on 02/06/15.  Report to [facility] Admitting at 645 A.M.  Call this number if you have problems the morning of surgery:  [phone]   Remember:  Do not eat food or drink liquids after midnight.  Take these medicines the morning of surgery with A SIP OF WATER allopurinol,all inhalers   Do not wear jewelry, make-up or nail polish.  Do not wear lotions, powders, or perfumes.  You may wear deodorant.  Do not shave 48 hours prior to surgery.  Men may shave face and neck.  Do not bring valuables to the hospital.  [facility] is not responsible for any belongings or valuables.  Contacts, dentures or bridgework may not be worn into surgery.  Leave your suitcase in the car.  After surgery it may be brought to your room.  For patients admitted to the hospital, discharge time will be determined by your treatment team.  Patients discharged the day of surgery will not be allowed to drive home.   Name and phone number of your driver:   Special instructions:    Please read over the following fact sheets that you were given. Pain Booklet, Coughing and Deep Breathing, Blood Transfusion Information, MRSA Information and Surgical Site Infection Prevention

## 2015-02-01 LAB — URINE CULTURE

## 2015-02-03 MED ORDER — TRANEXAMIC ACID 1000 MG/10ML IV SOLN
2000.0000 mg | INTRAVENOUS | Status: DC
  Filled 2015-02-03: qty 20

## 2015-02-03 MED ORDER — CEFAZOLIN SODIUM-DEXTROSE 2-3 GM-% IV SOLR
2.0000 g | INTRAVENOUS | Status: AC
  Administered 2015-02-06: 2 g via INTRAVENOUS

## 2015-02-03 MED ORDER — BUPIVACAINE LIPOSOME 1.3 % IJ SUSP
20.0000 mL | INTRAMUSCULAR | Status: AC
  Administered 2015-02-06: 20 mL
  Filled 2015-02-03: qty 20

## 2015-02-04 ENCOUNTER — Other Ambulatory Visit: Payer: Self-pay | Admitting: Internal Medicine

## 2015-02-06 ENCOUNTER — Encounter (HOSPITAL_COMMUNITY)
Admission: RE | Disposition: A | Discharge status: Home Health | Payer: Self-pay | Source: Ambulatory Visit | Attending: Orthopedic Surgery

## 2015-02-06 ENCOUNTER — Encounter (HOSPITAL_COMMUNITY): Payer: Self-pay | Admitting: *Deleted

## 2015-02-06 ENCOUNTER — Inpatient Hospital Stay (HOSPITAL_COMMUNITY): Payer: Medicare Other | Admitting: Anesthesiology

## 2015-02-06 ENCOUNTER — Other Ambulatory Visit: Payer: Self-pay | Admitting: *Deleted

## 2015-02-06 ENCOUNTER — Inpatient Hospital Stay (HOSPITAL_COMMUNITY)
Admission: RE | Admit: 2015-02-06 | Discharge: 2015-02-07 | DRG: 470 | Disposition: A | Discharge status: Home Health | Payer: Medicare Other | Source: Ambulatory Visit | Attending: Orthopedic Surgery | Admitting: Orthopedic Surgery

## 2015-02-06 DIAGNOSIS — F419 Anxiety disorder, unspecified: Secondary | ICD-10-CM | POA: Diagnosis present

## 2015-02-06 DIAGNOSIS — E0590 Thyrotoxicosis, unspecified without thyrotoxic crisis or storm: Secondary | ICD-10-CM | POA: Diagnosis present

## 2015-02-06 DIAGNOSIS — M109 Gout, unspecified: Secondary | ICD-10-CM | POA: Diagnosis present

## 2015-02-06 DIAGNOSIS — Z79899 Other long term (current) drug therapy: Secondary | ICD-10-CM | POA: Diagnosis not present

## 2015-02-06 DIAGNOSIS — I481 Persistent atrial fibrillation: Secondary | ICD-10-CM | POA: Diagnosis present

## 2015-02-06 DIAGNOSIS — Z888 Allergy status to other drugs, medicaments and biological substances status: Secondary | ICD-10-CM | POA: Diagnosis not present

## 2015-02-06 DIAGNOSIS — E669 Obesity, unspecified: Secondary | ICD-10-CM | POA: Diagnosis present

## 2015-02-06 DIAGNOSIS — Z9981 Dependence on supplemental oxygen: Secondary | ICD-10-CM | POA: Diagnosis not present

## 2015-02-06 DIAGNOSIS — J309 Allergic rhinitis, unspecified: Secondary | ICD-10-CM | POA: Diagnosis present

## 2015-02-06 DIAGNOSIS — Z791 Long term (current) use of non-steroidal anti-inflammatories (NSAID): Secondary | ICD-10-CM | POA: Diagnosis not present

## 2015-02-06 DIAGNOSIS — I10 Essential (primary) hypertension: Secondary | ICD-10-CM | POA: Diagnosis present

## 2015-02-06 DIAGNOSIS — M1711 Unilateral primary osteoarthritis, right knee: Secondary | ICD-10-CM | POA: Diagnosis present

## 2015-02-06 DIAGNOSIS — Z7901 Long term (current) use of anticoagulants: Secondary | ICD-10-CM

## 2015-02-06 DIAGNOSIS — Z96659 Presence of unspecified artificial knee joint: Secondary | ICD-10-CM

## 2015-02-06 DIAGNOSIS — M25561 Pain in right knee: Secondary | ICD-10-CM | POA: Diagnosis present

## 2015-02-06 HISTORY — PX: TOTAL KNEE ARTHROPLASTY: SHX125

## 2015-02-06 SURGERY — ARTHROPLASTY, KNEE, TOTAL
Anesthesia: Regional | Site: Knee | Laterality: Right

## 2015-02-06 MED ORDER — FLEET ENEMA 7-19 GM/118ML RE ENEM: 1.0000 | ENEMA | Freq: Once | RECTAL | Status: AC | PRN

## 2015-02-06 MED ORDER — ALUM & MAG HYDROXIDE-SIMETH 200-200-20 MG/5ML PO SUSP: 30.0000 mL | ORAL | Status: DC | PRN

## 2015-02-06 MED ORDER — METHOCARBAMOL 500 MG PO TABS
500.0000 mg | ORAL_TABLET | Freq: Four times a day (QID) | ORAL | Status: DC | PRN
  Administered 2015-02-06 – 2015-02-07 (×3): 500 mg via ORAL
  Filled 2015-02-06 (×3): qty 1

## 2015-02-06 MED ORDER — LACTATED RINGERS IV SOLN
INTRAVENOUS | Status: DC | PRN
  Administered 2015-02-06 (×2): via INTRAVENOUS

## 2015-02-06 MED ORDER — MENTHOL 3 MG MT LOZG: 1.0000 | LOZENGE | OROMUCOSAL | Status: DC | PRN

## 2015-02-06 MED ORDER — METHIMAZOLE 5 MG PO TABS
5.0000 mg | ORAL_TABLET | Freq: Every day | ORAL | Status: DC
  Administered 2015-02-06: 5 mg via ORAL
  Filled 2015-02-06 (×2): qty 1

## 2015-02-06 MED ORDER — OXYCODONE HCL 5 MG PO TABS
5.0000 mg | ORAL_TABLET | ORAL | Status: DC | PRN
  Administered 2015-02-06 – 2015-02-07 (×6): 10 mg via ORAL
  Filled 2015-02-06 (×3): qty 2
  Filled 2015-02-06: qty 1
  Filled 2015-02-06 (×3): qty 2

## 2015-02-06 MED ORDER — DOFETILIDE 500 MCG PO CAPS
500.0000 ug | ORAL_CAPSULE | Freq: Two times a day (BID) | ORAL | Status: DC
  Administered 2015-02-06 – 2015-02-07 (×2): 500 ug via ORAL
  Filled 2015-02-06 (×4): qty 1

## 2015-02-06 MED ORDER — APIXABAN 5 MG PO TABS
5.0000 mg | ORAL_TABLET | Freq: Two times a day (BID) | ORAL | Status: DC
  Administered 2015-02-06 – 2015-02-07 (×3): 5 mg via ORAL
  Filled 2015-02-06 (×3): qty 1

## 2015-02-06 MED ORDER — FENTANYL CITRATE (PF) 100 MCG/2ML IJ SOLN
INTRAMUSCULAR | Status: AC
  Administered 2015-02-06: 25 ug via INTRAVENOUS
  Filled 2015-02-06: qty 2

## 2015-02-06 MED ORDER — DOCUSATE SODIUM 100 MG PO CAPS
100.0000 mg | ORAL_CAPSULE | Freq: Two times a day (BID) | ORAL | Status: DC
  Administered 2015-02-06 – 2015-02-07 (×2): 100 mg via ORAL
  Filled 2015-02-06 (×3): qty 1

## 2015-02-06 MED ORDER — EPHEDRINE SULFATE 50 MG/ML IJ SOLN
INTRAMUSCULAR | Status: AC
  Filled 2015-02-06: qty 1

## 2015-02-06 MED ORDER — PHENYLEPHRINE 40 MCG/ML (10ML) SYRINGE FOR IV PUSH (FOR BLOOD PRESSURE SUPPORT)
PREFILLED_SYRINGE | INTRAVENOUS | Status: AC
  Filled 2015-02-06: qty 10

## 2015-02-06 MED ORDER — LIDOCAINE HCL (CARDIAC) 20 MG/ML IV SOLN
INTRAVENOUS | Status: AC
  Filled 2015-02-06: qty 5

## 2015-02-06 MED ORDER — CELECOXIB 200 MG PO CAPS
200.0000 mg | ORAL_CAPSULE | Freq: Two times a day (BID) | ORAL | Status: DC
  Administered 2015-02-06 – 2015-02-07 (×2): 200 mg via ORAL
  Filled 2015-02-06 (×3): qty 1

## 2015-02-06 MED ORDER — SODIUM CHLORIDE 0.9 % IR SOLN
Status: DC | PRN
  Administered 2015-02-06: 3000 mL

## 2015-02-06 MED ORDER — PROPOFOL 10 MG/ML IV BOLUS
INTRAVENOUS | Status: DC | PRN
  Administered 2015-02-06: 20 mg via INTRAVENOUS
  Administered 2015-02-06: 150 mg via INTRAVENOUS
  Administered 2015-02-06: 30 mg via INTRAVENOUS
  Administered 2015-02-06: 20 mg via INTRAVENOUS

## 2015-02-06 MED ORDER — GLYCOPYRROLATE 0.2 MG/ML IJ SOLN
INTRAMUSCULAR | Status: DC | PRN
  Administered 2015-02-06: 0.2 mg via INTRAVENOUS

## 2015-02-06 MED ORDER — STERILE WATER FOR INJECTION IJ SOLN
INTRAMUSCULAR | Status: AC
  Filled 2015-02-06: qty 10

## 2015-02-06 MED ORDER — SODIUM CHLORIDE 0.9 % IV SOLN
INTRAVENOUS | Status: DC
Start: 2015-02-06 — End: 2015-02-07
  Administered 2015-02-06 – 2015-02-07 (×2): via INTRAVENOUS

## 2015-02-06 MED ORDER — MIDAZOLAM HCL 2 MG/2ML IJ SOLN
INTRAMUSCULAR | Status: AC
  Filled 2015-02-06: qty 2

## 2015-02-06 MED ORDER — CHLORHEXIDINE GLUCONATE 4 % EX LIQD: 60.0000 mL | Freq: Once | CUTANEOUS | Status: DC

## 2015-02-06 MED ORDER — ONDANSETRON HCL 4 MG/2ML IJ SOLN: 4.0000 mg | Freq: Once | INTRAMUSCULAR | Status: DC | PRN

## 2015-02-06 MED ORDER — FENTANYL CITRATE (PF) 250 MCG/5ML IJ SOLN
INTRAMUSCULAR | Status: AC
  Filled 2015-02-06: qty 5

## 2015-02-06 MED ORDER — PROPOFOL 10 MG/ML IV BOLUS
INTRAVENOUS | Status: AC
  Filled 2015-02-06: qty 20

## 2015-02-06 MED ORDER — METOCLOPRAMIDE HCL 5 MG/ML IJ SOLN: 5.0000 mg | Freq: Three times a day (TID) | INTRAMUSCULAR | Status: DC | PRN

## 2015-02-06 MED ORDER — OXYCODONE HCL ER 10 MG PO T12A
10.0000 mg | EXTENDED_RELEASE_TABLET | Freq: Two times a day (BID) | ORAL | Status: DC
  Administered 2015-02-06 – 2015-02-07 (×2): 10 mg via ORAL
  Filled 2015-02-06 (×2): qty 1

## 2015-02-06 MED ORDER — BISACODYL 5 MG PO TBEC: 5.0000 mg | DELAYED_RELEASE_TABLET | Freq: Every day | ORAL | Status: DC | PRN

## 2015-02-06 MED ORDER — EPHEDRINE SULFATE 50 MG/ML IJ SOLN
INTRAMUSCULAR | Status: DC | PRN
  Administered 2015-02-06: 10 mg via INTRAVENOUS

## 2015-02-06 MED ORDER — OXYCODONE HCL 5 MG PO TABS
ORAL_TABLET | ORAL | Status: AC
  Filled 2015-02-06: qty 2

## 2015-02-06 MED ORDER — FEXOFENADINE HCL 60 MG PO TABS: 60.0000 mg | ORAL_TABLET | Freq: Every day | ORAL | Status: DC

## 2015-02-06 MED ORDER — FENTANYL CITRATE (PF) 100 MCG/2ML IJ SOLN
25.0000 ug | INTRAMUSCULAR | Status: DC | PRN
  Administered 2015-02-06 (×2): 50 ug via INTRAVENOUS

## 2015-02-06 MED ORDER — ONDANSETRON HCL 4 MG PO TABS
4.0000 mg | ORAL_TABLET | Freq: Four times a day (QID) | ORAL | Status: DC | PRN
Start: 2015-02-06 — End: 2015-02-07

## 2015-02-06 MED ORDER — GLYCOPYRROLATE 0.2 MG/ML IJ SOLN
INTRAMUSCULAR | Status: AC
  Filled 2015-02-06: qty 1

## 2015-02-06 MED ORDER — ZOLPIDEM TARTRATE 5 MG PO TABS: 5.0000 mg | ORAL_TABLET | Freq: Every evening | ORAL | Status: DC | PRN

## 2015-02-06 MED ORDER — POTASSIUM CHLORIDE CRYS ER 20 MEQ PO TBCR
20.0000 meq | EXTENDED_RELEASE_TABLET | Freq: Every day | ORAL | Status: DC
  Administered 2015-02-06 – 2015-02-07 (×2): 20 meq via ORAL
  Filled 2015-02-06 (×2): qty 1

## 2015-02-06 MED ORDER — CEFAZOLIN SODIUM 1-5 GM-% IV SOLN
1.0000 g | Freq: Four times a day (QID) | INTRAVENOUS | Status: AC
  Administered 2015-02-06 (×2): 1 g via INTRAVENOUS
  Filled 2015-02-06 (×2): qty 50

## 2015-02-06 MED ORDER — BUDESONIDE 0.25 MG/2ML IN SUSP
0.2500 mg | Freq: Two times a day (BID) | RESPIRATORY_TRACT | Status: DC
  Administered 2015-02-06 – 2015-02-07 (×2): 0.25 mg via RESPIRATORY_TRACT
  Filled 2015-02-06 (×2): qty 2

## 2015-02-06 MED ORDER — FENTANYL CITRATE (PF) 100 MCG/2ML IJ SOLN
INTRAMUSCULAR | Status: DC | PRN
  Administered 2015-02-06 (×2): 50 ug via INTRAVENOUS
  Administered 2015-02-06: 25 ug via INTRAVENOUS
  Administered 2015-02-06 (×4): 50 ug via INTRAVENOUS
  Administered 2015-02-06: 25 ug via INTRAVENOUS

## 2015-02-06 MED ORDER — LORATADINE 10 MG PO TABS
10.0000 mg | ORAL_TABLET | Freq: Every day | ORAL | Status: DC
  Administered 2015-02-06: 10 mg via ORAL
  Filled 2015-02-06: qty 1

## 2015-02-06 MED ORDER — LACTATED RINGERS IV SOLN
INTRAVENOUS | Status: DC
  Administered 2015-02-06: 08:00:00 via INTRAVENOUS

## 2015-02-06 MED ORDER — ACETAMINOPHEN 325 MG PO TABS: 650.0000 mg | ORAL_TABLET | Freq: Four times a day (QID) | ORAL | Status: DC | PRN

## 2015-02-06 MED ORDER — FUROSEMIDE 20 MG PO TABS
20.0000 mg | ORAL_TABLET | Freq: Every day | ORAL | Status: DC
  Administered 2015-02-06 – 2015-02-07 (×2): 20 mg via ORAL
  Filled 2015-02-06 (×2): qty 1

## 2015-02-06 MED ORDER — BUPIVACAINE-EPINEPHRINE 0.5% -1:200000 IJ SOLN
INTRAMUSCULAR | Status: DC | PRN
  Administered 2015-02-06: 30 mL

## 2015-02-06 MED ORDER — SUCCINYLCHOLINE CHLORIDE 20 MG/ML IJ SOLN
INTRAMUSCULAR | Status: AC
  Filled 2015-02-06: qty 1

## 2015-02-06 MED ORDER — METOCLOPRAMIDE HCL 5 MG PO TABS
5.0000 mg | ORAL_TABLET | Freq: Three times a day (TID) | ORAL | Status: DC | PRN
Start: 2015-02-06 — End: 2015-02-07

## 2015-02-06 MED ORDER — ONDANSETRON HCL 4 MG/2ML IJ SOLN
INTRAMUSCULAR | Status: DC | PRN
  Administered 2015-02-06: 4 mg via INTRAVENOUS

## 2015-02-06 MED ORDER — ALBUTEROL SULFATE (2.5 MG/3ML) 0.083% IN NEBU
1.0000 mL | INHALATION_SOLUTION | RESPIRATORY_TRACT | Status: DC | PRN
  Administered 2015-02-06: 2 mL via RESPIRATORY_TRACT
  Filled 2015-02-06: qty 3

## 2015-02-06 MED ORDER — ONDANSETRON HCL 4 MG/2ML IJ SOLN
INTRAMUSCULAR | Status: AC
  Filled 2015-02-06: qty 2

## 2015-02-06 MED ORDER — SENNOSIDES-DOCUSATE SODIUM 8.6-50 MG PO TABS: 1.0000 | ORAL_TABLET | Freq: Every evening | ORAL | Status: DC | PRN

## 2015-02-06 MED ORDER — DEXTROSE 5 % IV SOLN
500.0000 mg | Freq: Four times a day (QID) | INTRAVENOUS | Status: DC | PRN
  Filled 2015-02-06: qty 5

## 2015-02-06 MED ORDER — ROCURONIUM BROMIDE 50 MG/5ML IV SOLN
INTRAVENOUS | Status: AC
  Filled 2015-02-06: qty 1

## 2015-02-06 MED ORDER — ACETAMINOPHEN 650 MG RE SUPP: 650.0000 mg | Freq: Four times a day (QID) | RECTAL | Status: DC | PRN

## 2015-02-06 MED ORDER — HYDROMORPHONE HCL 1 MG/ML IJ SOLN
1.0000 mg | INTRAMUSCULAR | Status: DC | PRN
  Administered 2015-02-06 (×2): 1 mg via INTRAVENOUS
  Filled 2015-02-06 (×2): qty 1

## 2015-02-06 MED ORDER — BUPIVACAINE-EPINEPHRINE (PF) 0.5% -1:200000 IJ SOLN
INTRAMUSCULAR | Status: DC | PRN
  Administered 2015-02-06: 20 mL

## 2015-02-06 MED ORDER — FENTANYL CITRATE (PF) 100 MCG/2ML IJ SOLN
25.0000 ug | Freq: Once | INTRAMUSCULAR | Status: AC
  Administered 2015-02-06: 25 ug via INTRAVENOUS

## 2015-02-06 MED ORDER — ONDANSETRON HCL 4 MG/2ML IJ SOLN: 4.0000 mg | Freq: Four times a day (QID) | INTRAMUSCULAR | Status: DC | PRN

## 2015-02-06 MED ORDER — MAGNESIUM OXIDE 400 (241.3 MG) MG PO TABS
400.0000 mg | ORAL_TABLET | Freq: Every day | ORAL | Status: DC
  Administered 2015-02-06 – 2015-02-07 (×2): 400 mg via ORAL
  Filled 2015-02-06 (×2): qty 1

## 2015-02-06 MED ORDER — BUPIVACAINE-EPINEPHRINE (PF) 0.5% -1:200000 IJ SOLN
INTRAMUSCULAR | Status: AC
  Filled 2015-02-06: qty 30

## 2015-02-06 MED ORDER — ALLOPURINOL 300 MG PO TABS
300.0000 mg | ORAL_TABLET | Freq: Every day | ORAL | Status: DC
  Administered 2015-02-06 – 2015-02-07 (×2): 300 mg via ORAL
  Filled 2015-02-06 (×2): qty 1

## 2015-02-06 MED ORDER — SODIUM CHLORIDE 0.9 % IJ SOLN
INTRAMUSCULAR | Status: AC
  Filled 2015-02-06: qty 10

## 2015-02-06 MED ORDER — FENTANYL CITRATE (PF) 100 MCG/2ML IJ SOLN
INTRAMUSCULAR | Status: AC
  Filled 2015-02-06: qty 2

## 2015-02-06 MED ORDER — DIPHENHYDRAMINE HCL 12.5 MG/5ML PO ELIX: 12.5000 mg | ORAL_SOLUTION | ORAL | Status: DC | PRN

## 2015-02-06 MED ORDER — IRBESARTAN 150 MG PO TABS
150.0000 mg | ORAL_TABLET | Freq: Every day | ORAL | Status: DC
  Administered 2015-02-07: 150 mg via ORAL
  Filled 2015-02-06 (×2): qty 1

## 2015-02-06 MED ORDER — SALMETEROL XINAFOATE 50 MCG/DOSE IN AEPB
1.0000 | INHALATION_SPRAY | Freq: Two times a day (BID) | RESPIRATORY_TRACT | Status: DC
  Administered 2015-02-06 – 2015-02-07 (×2): 1 via RESPIRATORY_TRACT
  Filled 2015-02-06: qty 0

## 2015-02-06 MED ORDER — PHENOL 1.4 % MT LIQD: 1.0000 | OROMUCOSAL | Status: DC | PRN

## 2015-02-06 MED ORDER — SUCCINYLCHOLINE CHLORIDE 20 MG/ML IJ SOLN
INTRAMUSCULAR | Status: DC | PRN
  Administered 2015-02-06: 100 mg via INTRAVENOUS

## 2015-02-06 MED ORDER — LIDOCAINE HCL (CARDIAC) 20 MG/ML IV SOLN
INTRAVENOUS | Status: DC | PRN
  Administered 2015-02-06: 60 mg via INTRAVENOUS

## 2015-02-06 SURGICAL SUPPLY — 60 items
BANDAGE ESMARK 6X9 LF (GAUZE/BANDAGES/DRESSINGS) ×1 IMPLANT
BLADE SAGITTAL 13X1.27X60 (BLADE) ×2 IMPLANT
BLADE SAGITTAL 13X1.27X60MM (BLADE) ×1
BLADE SAW SGTL 83.5X18.5 (BLADE) ×3 IMPLANT
BLADE SURG 10 STRL SS (BLADE) ×3 IMPLANT
BNDG CMPR 9X6 STRL LF SNTH (GAUZE/BANDAGES/DRESSINGS) ×1
BNDG ESMARK 6X9 LF (GAUZE/BANDAGES/DRESSINGS) ×3
BOWL SMART MIX CTS (DISPOSABLE) ×3 IMPLANT
CAPT KNEE TOTAL 3 ×3 IMPLANT
CEMENT BONE SIMPLEX SPEEDSET (Cement) ×6 IMPLANT
COVER SURGICAL LIGHT HANDLE (MISCELLANEOUS) ×3 IMPLANT
CUFF TOURNIQUET SINGLE 34IN LL (TOURNIQUET CUFF) ×3 IMPLANT
DRAPE EXTREMITY T 121X128X90 (DRAPE) ×3 IMPLANT
DRAPE IMP U-DRAPE 54X76 (DRAPES) ×3 IMPLANT
DRAPE INCISE IOBAN 66X45 STRL (DRAPES) ×6 IMPLANT
DRAPE PROXIMA HALF (DRAPES) IMPLANT
DRAPE U-SHAPE 47X51 STRL (DRAPES) ×3 IMPLANT
DRSG ADAPTIC 3X8 NADH LF (GAUZE/BANDAGES/DRESSINGS) ×3 IMPLANT
DRSG PAD ABDOMINAL 8X10 ST (GAUZE/BANDAGES/DRESSINGS) ×3 IMPLANT
DURAPREP 26ML APPLICATOR (WOUND CARE) ×6 IMPLANT
ELECT REM PT RETURN 9FT ADLT (ELECTROSURGICAL) ×3
ELECTRODE REM PT RTRN 9FT ADLT (ELECTROSURGICAL) ×1 IMPLANT
GAUZE SPONGE 4X4 12PLY STRL (GAUZE/BANDAGES/DRESSINGS) ×3 IMPLANT
GLOVE BIOGEL M 7.0 STRL (GLOVE) IMPLANT
GLOVE BIOGEL PI IND STRL 7.5 (GLOVE) IMPLANT
GLOVE BIOGEL PI IND STRL 8.5 (GLOVE) ×2 IMPLANT
GLOVE BIOGEL PI INDICATOR 7.5 (GLOVE)
GLOVE BIOGEL PI INDICATOR 8.5 (GLOVE) ×4
GLOVE SURG ORTHO 8.0 STRL STRW (GLOVE) ×6 IMPLANT
GOWN STRL REUS W/ TWL LRG LVL3 (GOWN DISPOSABLE) ×1 IMPLANT
GOWN STRL REUS W/ TWL XL LVL3 (GOWN DISPOSABLE) ×2 IMPLANT
GOWN STRL REUS W/TWL LRG LVL3 (GOWN DISPOSABLE) ×3
GOWN STRL REUS W/TWL XL LVL3 (GOWN DISPOSABLE) ×6
HANDPIECE INTERPULSE COAX TIP (DISPOSABLE) ×3
HOOD PEEL AWAY FACE SHEILD DIS (HOOD) ×9 IMPLANT
KIT BASIN OR (CUSTOM PROCEDURE TRAY) ×3 IMPLANT
KIT ROOM TURNOVER OR (KITS) ×3 IMPLANT
KNEE CAPITATED TOTAL 3 IMPLANT
MANIFOLD NEPTUNE II (INSTRUMENTS) ×3 IMPLANT
NEEDLE 22X1 1/2 (OR ONLY) (NEEDLE) ×6 IMPLANT
NS IRRIG 1000ML POUR BTL (IV SOLUTION) ×3 IMPLANT
PACK TOTAL JOINT (CUSTOM PROCEDURE TRAY) ×3 IMPLANT
PACK UNIVERSAL I (CUSTOM PROCEDURE TRAY) ×3 IMPLANT
PAD ARMBOARD 7.5X6 YLW CONV (MISCELLANEOUS) ×6 IMPLANT
PADDING CAST COTTON 6X4 STRL (CAST SUPPLIES) ×3 IMPLANT
SET HNDPC FAN SPRY TIP SCT (DISPOSABLE) ×1 IMPLANT
SPONGE GAUZE 4X4 12PLY STER LF (GAUZE/BANDAGES/DRESSINGS) ×2 IMPLANT
STAPLER VISISTAT 35W (STAPLE) ×3 IMPLANT
SUCTION FRAZIER TIP 10 FR DISP (SUCTIONS) ×3 IMPLANT
SUT BONE WAX W31G (SUTURE) ×3 IMPLANT
SUT VIC AB 0 CTB1 27 (SUTURE) ×6 IMPLANT
SUT VIC AB 1 CT1 27 (SUTURE) ×6
SUT VIC AB 1 CT1 27XBRD ANBCTR (SUTURE) ×2 IMPLANT
SUT VIC AB 2-0 CT1 27 (SUTURE) ×6
SUT VIC AB 2-0 CT1 TAPERPNT 27 (SUTURE) ×2 IMPLANT
SYR 20CC LL (SYRINGE) ×6 IMPLANT
TOWEL OR 17X24 6PK STRL BLUE (TOWEL DISPOSABLE) ×3 IMPLANT
TOWEL OR 17X26 10 PK STRL BLUE (TOWEL DISPOSABLE) ×3 IMPLANT
TRAY CATH 16FR W/PLASTIC CATH (SET/KITS/TRAYS/PACK) ×3 IMPLANT
WATER STERILE IRR 1000ML POUR (IV SOLUTION) ×6 IMPLANT

## 2015-02-06 NOTE — Anesthesia Procedure Notes (Addendum)
Anesthesia Regional Block:  Adductor canal block  Pre-Anesthetic Checklist: ,, timeout performed, Correct Patient, Correct Site, Correct Laterality, Correct Procedure, Correct Position, site marked, Risks and benefits discussed,  Surgical consent,  Pre-op evaluation,  At surgeon's request and post-op pain management  Laterality: Right  Prep: chloraprep       Needles:  Injection technique: Single-shot  Needle Type: Stimiplex          Additional Needles:  Procedures: ultrasound guided (picture in chart) Adductor canal block Narrative:   Performed by: Personally  Anesthesiologist: JUDD, BENJAMIN  Additional Notes: Patient tolerated the procedure well without complications   Procedure Name: Intubation Date/Time: 02/06/2015 9:05 AM Performed by: Leonel Ramsay Pre-anesthesia Checklist: Patient identified, Timeout performed, Emergency Drugs available, Suction available and Patient being monitored Patient Re-evaluated:Patient Re-evaluated prior to inductionOxygen Delivery Method: Circle system utilized Preoxygenation: Pre-oxygenation with 100% oxygen Intubation Type: IV induction Ventilation: Mask ventilation without difficulty Laryngoscope Size: Mac and 3 Grade View: Grade I Tube type: Oral Tube size: 7.0 mm Number of attempts: 1 Airway Equipment and Method: Stylet Placement Confirmation: ETT inserted through vocal cords under direct vision,  positive ETCO2 and breath sounds checked- equal and bilateral Secured at: 22 cm Tube secured with: Tape Dental Injury: Teeth and Oropharynx as per pre-operative assessment  Comments: Unable to seat LMA #4, atraumatic, convert to GETA. VSS throughout. KHO

## 2015-02-06 NOTE — OR Nursing (Signed)
Dr. Gentry Roch at bs, EKG reviewed, pt may proceed to room without cardiac monitoring.

## 2015-02-06 NOTE — Anesthesia Preprocedure Evaluation (Addendum)
Anesthesia Evaluation  Patient identified by MRN, date of birth, ID band Patient awake    Reviewed: Allergy & Precautions, NPO status , Patient's Chart, lab work & pertinent test results, reviewed documented beta blocker date and time   Airway Mallampati: I       Dental  (+) Teeth Intact   Pulmonary shortness of breath and with exertion, asthma ,  breath sounds clear to auscultation        Cardiovascular hypertension, Pt. on medications + DOE + dysrhythmias Atrial Fibrillation Rhythm:Irregular     Neuro/Psych negative neurological ROS  negative psych ROS   GI/Hepatic negative GI ROS, Neg liver ROS,   Endo/Other  Hyperthyroidism   Renal/GU negative Renal ROS     Musculoskeletal  (+) Arthritis -, Osteoarthritis,    Abdominal (+) + obese,   Peds  Hematology negative hematology ROS (+)   Anesthesia Other Findings NPO appropriate, allergies reviewed Denies active cardiac or pulmonary symptoms, METS < 4 due to chronic a-fib No recent congestive cough or symptoms of upper respiratory infection  Per Cardiology note from 12/20/14 is cleared from cardiac standpoint, now in sinus, she does have preserved EF.Marland Kitchen Has been off Eliquous for <48 hours   Reproductive/Obstetrics negative OB ROS                           Anesthesia Physical  Anesthesia Plan  ASA: III  Anesthesia Plan: Regional and General   Post-op Pain Management:    Induction: Intravenous  Airway Management Planned: LMA  Additional Equipment:   Intra-op Plan:   Post-operative Plan:   Informed Consent: I have reviewed the patients History and Physical, chart, labs and discussed the procedure including the risks, benefits and alternatives for the proposed anesthesia with the patient or authorized representative who has indicated his/her understanding and acceptance.   Dental advisory given  Plan Discussed with: CRNA,  Anesthesiologist and Surgeon  Anesthesia Plan Comments: (Will perform spinal if patient has taken Eliquos >72 hours pre-surgery per anesthesia protocol for coagulation in setting of neuraxial procedure)        Anesthesia Quick Evaluation

## 2015-02-06 NOTE — H&P (Signed)
Crystal Hess MRN:  [id number] DOB/SEX:  12-07-1929/female  CHIEF COMPLAINT:  Painful right Knee  HISTORY: Patient is a 81 y.o. female presented with a history of pain in the right knee. Onset of symptoms was gradual starting several years ago with gradually worsening course since that time. Prior procedures on the knee include arthroscopy. Patient has been treated conservatively with over-the-counter NSAIDs and activity modification. Patient currently rates pain in the knee at 10 out of 10 with activity. There is pain at night.  PAST MEDICAL HISTORY: Patient Active Problem List   Diagnosis Date Noted  . Visit for monitoring Tikosyn therapy 11/08/2014  . Atrial fibrillation 11/08/2014  . Persistent atrial fibrillation 10/10/2014  . Snoring 10/10/2014  . Morbid obesity 10/10/2014  . Essential hypertension 10/10/2014  . Acute bronchitis 08/10/2012  . Syncope 12/07/2010  . ALLERGIC RHINITIS 05/04/2008  . Allergic-infective asthma 05/04/2008   Past Medical History  Diagnosis Date  . Hypertension   . Asthma   . DOE (dyspnea on exertion)     wears O2 at night but has not had a sleep study  . Mitral regurgitation     mild  . Trace tricuspid regurgitation by prior echocardiogram   . Persistent atrial fibrillation   . Obesity   . Hyperthyroidism   . Family history of adverse reaction to anesthesia     "sister's heart stopped and had to be revived; think it was related to the demoral"  . Walking pneumonia ~ 1998  . On home oxygen therapy     "2L just at night" (11/08/2014)  . DJD (degenerative joint disease)   . Arthritis     "knees, hands" (11/08/2014)  . Chronic lower back pain   . Gout     "I've had it twice already this year" (11/08/2014)  . Dysrhythmia   . Anxiety    Past Surgical History  Procedure Laterality Date  . Excisional hemorrhoidectomy  ~ 1960  . Ovarian cyst removal Right 1963    "gangrene"  . Shoulder arthroscopy Left ~ 2008    bone spur  . Knee cartilage  surgery Right 1976    "cut it open"  . Tonsillectomy    . Cardioversion N/A 07/26/2014    Procedure: CARDIOVERSION;  Surgeon: Pamella Pert, MD;  Location: [facility] ENDOSCOPY;  Service: Cardiovascular;  Laterality: N/A;  . Cardioversion N/A 08/30/2014    Procedure: CARDIOVERSION;  Surgeon: Pamella Pert, MD;  Location: [facility] ENDOSCOPY;  Service: Cardiovascular;  Laterality: N/A;  . Cataract extraction w/ intraocular lens  implant, bilateral Bilateral ~ 2006  . Eye surgery       MEDICATIONS:   Prescriptions prior to admission  Medication Sig Dispense Refill Last Dose  . acetaminophen (TYLENOL) 650 MG CR tablet Take 650 mg by mouth 2 (two) times daily.   Taking  . allopurinol (ZYLOPRIM) 300 MG tablet Take 300 mg by mouth daily.      . beclomethasone (QVAR) 80 MCG/ACT inhaler INHALE 1 PUFF BY MOUTH TWICE DAILY (Patient taking differently: Inhale 1 puff into the lungs 2 (two) times daily as needed (for shortness of breath). ) 8.7 g 6 Taking  . Calcium Carbonate-Vit D-Min (CALCIUM 600 + MINERALS) 600-200 MG-UNIT TABS Take 1 tablet by mouth daily.   Taking  . clobetasol ointment (TEMOVATE) 0.05 % Apply 1 application topically 2 (two) times daily as needed (for rash).    Taking  . dofetilide (TIKOSYN) 500 MCG capsule Take 1 capsule (500 mcg total) by mouth 2 (two)  times daily. 60 capsule 3   . ELIQUIS 5 MG TABS tablet Take 5 mg by mouth 2 (two) times daily.  5 Taking  . Fexofenadine HCl (ALLEGRA ALLERGY PO) Take 1 tablet by mouth at bedtime.    Taking  . FORADIL AEROLIZER 12 MCG capsule for inhaler INHALE CONTENTS OF CAPSULE TWICE DAILY (Patient taking differently: INHALE CONTENTS OF CAPSULE TWICE DAILY AS NEEDED FOR SHORTNESS OF BREATH OR WHEEZING) 60 capsule 0 Taking  . furosemide (LASIX) 20 MG tablet Take 1 tablet (20 mg total) by mouth daily. 30 tablet 3 Taking  . magnesium oxide (MAG-OX) 400 (241.3 MG) MG tablet Take 1 tablet (400 mg total) by mouth daily. 30 tablet 5 Taking  . methimazole  (TAPAZOLE) 5 MG tablet Take 5 mg by mouth at bedtime.   2 Taking  . Omega-3 Fatty Acids (FISH OIL) 600 MG CAPS Take 600 mg by mouth daily.    Taking  . potassium chloride SA (K-DUR,KLOR-CON) 20 MEQ tablet Take 1 tablet (20 mEq total) by mouth daily. 30 tablet 3 Taking  . PROVENTIL HFA 108 (90 BASE) MCG/ACT inhaler INHALE 2 PUFFS INTO LUNGS EVERY 6 HOURS AS NEEDED FOR WHEEZING AND SHORTNESS OF BREATH 6.7 g 2 Taking  . valsartan (DIOVAN) 160 MG tablet Take 1 tablet (160 mg total) by mouth daily. 30 tablet 6 Taking    ALLERGIES:   Allergies  Allergen Reactions  . Meperidine And Related Other (See Comments)    "have hallucinations; get combative; I don't want that"  . Codeine Nausea Only  . Methotrexate Derivatives Other (See Comments)    Restless  . Vasculera [Nutritional Supplements] Other (See Comments)    Unknown    REVIEW OF SYSTEMS:  A comprehensive review of systems was negative.   FAMILY HISTORY:   Family History  Problem Relation Age of Onset  . Lymphoma Mother   . Lung cancer Father     smoker  . Emphysema Brother     SOCIAL HISTORY:   History  Substance Use Topics  . Smoking status: Never Smoker   . Smokeless tobacco: Never Used  . Alcohol Use: No     EXAMINATION:  Vital signs in last 24 hours:    General appearance: alert, cooperative and no distress Lungs: clear to auscultation bilaterally Heart: regular rate and rhythm, S1, S2 normal, no murmur, click, rub or gallop Abdomen: soft, non-tender; bowel sounds normal; no masses,  no organomegaly Extremities: extremities normal, atraumatic, no cyanosis or edema and Homans sign is negative, no sign of DVT Pulses: 2+ and symmetric Skin: Skin color, texture, turgor normal. No rashes or lesions Neurologic: Alert and oriented X 3, normal strength and tone. Normal symmetric reflexes. Normal coordination and gait  Musculoskeletal:  ROM 0-115, Ligaments intact,  Imaging Review Plain radiographs demonstrate severe  degenerative joint disease of the right knee. The overall alignment is significant varus. The bone quality appears to be good for age and reported activity level.  Assessment/Plan: Primary osteoarthritis, right knee   The patient history, physical examination and imaging studies are consistent with advanced degenerative joint disease of the right knee. The patient has failed conservative treatment.  The clearance notes were reviewed.  After discussion with the patient it was felt that Total Knee Replacement was indicated. The procedure,  risks, and benefits of total knee arthroplasty were presented and reviewed. The risks including but not limited to aseptic loosening, infection, blood clots, vascular injury, stiffness, patella tracking problems complications among others were discussed. The  patient acknowledged the explanation, agreed to proceed with the plan.  Crystal Hess 02/06/2015, 6:39 AM

## 2015-02-06 NOTE — Care Management Utilization Note (Signed)
Utilization review completed. Monda Chastain, RN Case Manager [phone]. 

## 2015-02-06 NOTE — Care Management Note (Addendum)
Case Management Note  Patient Details  Name: JAYDALYN DEMATTIA MRN: [id number] Date of Birth: [birth year]  Subjective/Objective:  81 yr old female s/p right total knee arthroplasty.                  Action/Plan: Patient was preoperativel setup with [facility], CM will continue to monitor.   Expected Discharge Date:                  Expected Discharge Plan:     In-House Referral:  NA  Discharge planning Services  CM Consult  Post Acute Care Choice:  Home Health Choice offered to:  Patient  DME Arranged:    DME Agency:     HH Arranged:  PT HH Agency:  [facility]  Status of Service:  In process, will continue to follow  Medicare Important Message Given:    Date Medicare IM Given:    Medicare IM give by:    Date Additional Medicare IM Given:    Additional Medicare Important Message give by:     If discussed at Long Length of Stay Meetings, dates discussed:    Additional Comments:  Durenda Guthrie, RN 02/06/2015, 2:38 PM

## 2015-02-06 NOTE — Transfer of Care (Signed)
Immediate Anesthesia Transfer of Care Note  Patient: Crystal Hess  Procedure(s) Performed: Procedure(s): RIGHT TOTAL KNEE ARTHROPLASTY (Right)  Patient Location: PACU  Anesthesia Type:General  Level of Consciousness: awake, alert  and oriented  Airway & Oxygen Therapy: Patient Spontanous Breathing and Patient connected to nasal cannula oxygen  Post-op Assessment: Report given to RN and Post -op Vital signs reviewed and stable  Post vital signs: Reviewed and stable  Last Vitals:  Filed Vitals:   02/06/15 0822  BP:   Pulse: 57  Temp:   Resp: 15    Complications: No apparent anesthesia complications

## 2015-02-06 NOTE — Evaluation (Signed)
Physical Therapy Evaluation Patient Details Name: Crystal Hess MRN: [id number] DOB: [birth year] Today's Date: 02/06/2015   History of Present Illness  Rt TKA  Clinical Impression  Pt is s/p TKA resulting in the deficits listed below (see PT Problem List). Pt will benefit from skilled PT to increase their independence and safety with mobility to allow discharge to home with family assistance.      Follow Up Recommendations Home health PT    Equipment Recommendations  None recommended by PT (patient reports having equipment)    Recommendations for Other Services       Precautions / Restrictions Precautions Precautions: Other (comment) (Allergic to polyester, cotton products only) Restrictions Weight Bearing Restrictions: Yes RLE Weight Bearing: Weight bearing as tolerated      Mobility  Bed Mobility Overal bed mobility: Needs Assistance Bed Mobility: Supine to Sit     Supine to sit: Min assist        Transfers Overall transfer level: Needs assistance Equipment used: Rolling walker (2 wheeled) Transfers: Sit to/from Stand Sit to Stand: Min assist         General transfer comment: repeated cues for hand placement with sitting. Transferred bed-toilet-chair.  Ambulation/Gait Ambulation/Gait assistance: Min guard Ambulation Distance (Feet): 10 Feet Assistive device: Rolling walker (2 wheeled) Gait Pattern/deviations: Step-to pattern;Decreased step length - right;Decreased weight shift to right Gait velocity: decreased      Stairs            Wheelchair Mobility    Modified Rankin (Stroke Patients Only)       Balance Overall balance assessment: Needs assistance Sitting-balance support: Single extremity supported Sitting balance-Leahy Scale: Poor     Standing balance support: Bilateral upper extremity supported Standing balance-Leahy Scale: Poor                               Pertinent Vitals/Pain Pain Assessment: 0-10 Pain  Score: 4  Pain Location: Rt knee Pain Descriptors / Indicators: Aching Pain Intervention(s): Monitored during session;Limited activity within patient's tolerance;Patient requesting pain meds-RN notified;Ice applied    Home Living Family/patient expects to be discharged to:: Private residence Living Arrangements: Alone Available Help at Discharge: Family (brother to stay with upon D/C, sister lives next door) Type of Home: House Home Access: Stairs to enter Entrance Stairs-Rails: Right Entrance Stairs-Number of Steps: 3 Home Layout: One level Home Equipment: Environmental consultant - 2 wheels      Prior Function Level of Independence: Independent               Hand Dominance        Extremity/Trunk Assessment               Lower Extremity Assessment: RLE deficits/detail RLE Deficits / Details: decreased strenght but able to perform SLR       Communication   Communication: No difficulties  Cognition Arousal/Alertness: Awake/alert Behavior During Therapy: WFL for tasks assessed/performed Overall Cognitive Status: Within Functional Limits for tasks assessed                      General Comments      Exercises        Assessment/Plan    PT Assessment Patient needs continued PT services  PT Diagnosis Difficulty walking;Generalized weakness;Acute pain   PT Problem List Decreased strength;Decreased range of motion;Decreased activity tolerance;Decreased balance;Decreased mobility  PT Treatment Interventions DME instruction;Gait training;Stair training;Functional mobility training;Therapeutic activities;Therapeutic exercise;Patient/family education  PT Goals (Current goals can be found in the Care Plan section) Acute Rehab PT Goals Patient Stated Goal: return home PT Goal Formulation: With patient Time For Goal Achievement: 02/20/15 Potential to Achieve Goals: Good    Frequency 7X/week   Barriers to discharge        Co-evaluation               End of  Session Equipment Utilized During Treatment: Gait belt Activity Tolerance: Patient tolerated treatment well Patient left: in chair;with call bell/phone within reach;with family/visitor present (ice on, in bone foam) Nurse Communication: Mobility status         Time: 1325-1402 PT Time Calculation (min) (ACUTE ONLY): 37 min   Charges:   PT Evaluation $Initial PT Evaluation Tier I: 1 Procedure PT Treatments $Gait Training: 8-22 mins   PT G Codes:        Christiane Ha, PT, CSCS Pager [phone] Office [phone]  02/06/2015, 2:41 PM

## 2015-02-06 NOTE — Progress Notes (Signed)
Reports allergy to polyester. Patient placed on paper sheets/ paper gown. OR notified

## 2015-02-06 NOTE — Progress Notes (Signed)
Orthopedic Tech Progress Note Patient Details:  Crystal Hess 11-29-1929 [id number]  CPM Right Knee CPM Right Knee: On Right Knee Flexion (Degrees): 90 Right Knee Extension (Degrees): 0 Additional Comments: trapeze bar patient helper Viewed order from doctor's order list  [name] 02/06/2015, 11:43 AM

## 2015-02-06 NOTE — Anesthesia Postprocedure Evaluation (Signed)
  Anesthesia Post-op Note  Patient: [name]  Procedure(s) Performed: Procedure(s) (LRB): RIGHT TOTAL KNEE ARTHROPLASTY (Right)  Patient Location: PACU  Anesthesia Type: General  Level of Consciousness: awake and alert   Airway and Oxygen Therapy: Patient Spontanous Breathing  Post-op Pain: mild  Post-op Assessment: Post-op Vital signs reviewed, Patient's Cardiovascular Status Stable, Respiratory Function Stable, Patent Airway and No signs of Nausea or vomiting  Last Vitals:  Filed Vitals:   02/06/15 1200  BP: 162/54  Pulse: 78  Temp:   Resp: 18    Post-op Vital Signs: stable   Complications: No apparent anesthesia complications Got post op EKG to eval for rhythm given intraop ectopy.. Asymptomatic currently, baseline rhythm observed with sinus, occasional PACs on EKG.. Given baseline EKG and vital signs she is good for PACU discharge

## 2015-02-07 ENCOUNTER — Encounter (HOSPITAL_COMMUNITY): Payer: Self-pay | Admitting: Orthopedic Surgery

## 2015-02-07 LAB — BASIC METABOLIC PANEL
ANION GAP: 8 (ref 5–15)
BUN: 11 mg/dL (ref 6–20)
CALCIUM: 8.6 mg/dL — AB (ref 8.9–10.3)
CO2: 25 mmol/L (ref 22–32)
Chloride: 101 mmol/L (ref 101–111)
Creatinine, Ser: 0.62 mg/dL (ref 0.44–1.00)
GFR calc Af Amer: >60 mL/min (ref >60)
GFR calc non Af Amer: >60 mL/min (ref >60)
Glucose, Bld: 157 mg/dL — ABNORMAL HIGH (ref 65–99)
Potassium: 4.1 mmol/L (ref 3.5–5.1)
SODIUM: 134 mmol/L — AB (ref 135–145)

## 2015-02-07 LAB — CBC
HCT: 29.4 % — ABNORMAL LOW (ref 36.0–46.0)
HEMOGLOBIN: 9.8 g/dL — AB (ref 12.0–15.0)
MCH: 30.9 pg (ref 26.0–34.0)
MCHC: 33.3 g/dL (ref 30.0–36.0)
MCV: 92.7 fL (ref 78.0–100.0)
PLATELETS: 160 10*3/uL (ref 150–400)
RBC: 3.17 MIL/uL — AB (ref 3.87–5.11)
RDW: 14.1 % (ref 11.5–15.5)
WBC: 10.5 10*3/uL (ref 4.0–10.5)

## 2015-02-07 MED ORDER — METHOCARBAMOL 500 MG PO TABS: 500.0000 mg | ORAL_TABLET | Freq: Four times a day (QID) | ORAL | Status: DC | PRN

## 2015-02-07 MED ORDER — OXYCODONE HCL 5 MG PO TABS: 5.0000 mg | ORAL_TABLET | ORAL | Status: DC | PRN

## 2015-02-07 MED ORDER — OXYCODONE HCL ER 10 MG PO T12A
10.0000 mg | EXTENDED_RELEASE_TABLET | Freq: Two times a day (BID) | ORAL | Status: DC
Start: 2015-02-07 — End: 2015-12-18

## 2015-02-07 MED ORDER — FUROSEMIDE 20 MG PO TABS: 20.0000 mg | ORAL_TABLET | Freq: Every day | ORAL | Status: DC

## 2015-02-07 NOTE — Progress Notes (Signed)
Physical Therapy Treatment Patient Details Name: Crystal Hess MRN: [id number] DOB: 1929/10/02 Today's Date: 02/07/2015    History of Present Illness 81 y.o. s/p Rt TKA.    PT Comments    Pt was able to ambulate stairs successfully. Pt reported pain upon returning to room but stated that she felt that "it was just sore from moving around today". Pt plans to discharge this afternoon. Continue to recommend HHPT for ongoing rehab to increase functional independence.   Follow Up Recommendations  Home health PT     Equipment Recommendations  None recommended by PT    Recommendations for Other Services       Precautions / Restrictions Precautions Precautions: Fall;Knee Restrictions Weight Bearing Restrictions: Yes RLE Weight Bearing: Weight bearing as tolerated    Mobility  Bed Mobility               General bed mobility comments: Pt in chair upon entering room and returned to chair after session.  Transfers Overall transfer level: Needs assistance Equipment used: Rolling walker (2 wheeled) Transfers: Sit to/from Stand Sit to Stand: Min guard         General transfer comment: Min guard for safety. Pt tried to sit down about 1' away from chair. Cues for pt to feel back of legs up against chair before sitting.  Ambulation/Gait Ambulation/Gait assistance: Min guard Ambulation Distance (Feet): 100 Feet Assistive device: Rolling walker (2 wheeled) Gait Pattern/deviations: Step-through pattern;Decreased step length - left;Decreased stance time - right;Decreased weight shift to right   Gait velocity interpretation: Below normal speed for age/gender General Gait Details: Cues for safety with RW as pt tends to bump into objects. Cues to initiate right knee extension during stance phase of gait.   Stairs Stairs: Yes Stairs assistance: Min guard Stair Management: Two rails;Forwards Number of Stairs: 4 General stair comments: Cues for hand placement, technique,  and sequence. Min G for safety.  Wheelchair Mobility    Modified Rankin (Stroke Patients Only)       Balance                                    Cognition Arousal/Alertness: Awake/alert Behavior During Therapy: WFL for tasks assessed/performed Overall Cognitive Status: Within Functional Limits for tasks assessed                      Exercises Total Joint Exercises Quad Sets: Both;10 reps;Seated Heel Slides: AROM;Right;Seated;5 reps Hip ABduction/ADduction: AROM;Right;10 reps;Seated Straight Leg Raises: AROM;Right;Seated;5 reps Long Arc Quad: AROM;Right;Seated;5 reps    General Comments        Pertinent Vitals/Pain Pain Assessment: 0-10 Pain Score: 3  Pain Location: R knee Pain Descriptors / Indicators: Aching;Sore Pain Intervention(s): Monitored during session    Home Living Family/patient expects to be discharged to:: Private residence Living Arrangements: Alone Available Help at Discharge: Family (brother to stay with upon d/c, sister lives next door) Type of Home: House Home Access: Stairs to enter Entrance Stairs-Rails: Right Home Layout: One level Home Equipment: Environmental consultant - 2 wheels;Bedside commode;Grab bars - tub/shower (BSC delivered to room)      Prior Function Level of Independence: Independent          PT Goals (current goals can now be found in the care plan section) Progress towards PT goals: Progressing toward goals    Frequency  7X/week    PT Plan Current plan remains appropriate  Co-evaluation             End of Session Equipment Utilized During Treatment: Gait belt Activity Tolerance: Patient tolerated treatment well Patient left: in chair;with call bell/phone within reach;with family/visitor present     Time: 1147-1202 PT Time Calculation (min) (ACUTE ONLY): 15 min  Charges:  $Gait Training: 8-22 mins $Therapeutic Exercise: 8-22 mins                    G Codes:      Nita Sells, SPTA 2015-03-05,  12:44 PM

## 2015-02-07 NOTE — Progress Notes (Signed)
Physical Therapy Treatment Patient Details Name: Crystal Hess MRN: [id number] DOB: [birth year] Today's Date: February 15, 2015    History of Present Illness Rt TKA    PT Comments    Pt is motivated to progress with rehab in order to go home. States that she is planning to go home this afternoon. Will plan to see her before D/C this afternoon and do stairs. Continue to recommend HHPT for ongoing rehab to increase functional independence.   Follow Up Recommendations  Home health PT     Equipment Recommendations  None recommended by PT    Recommendations for Other Services       Precautions / Restrictions Precautions Precautions: Other (comment) Restrictions RLE Weight Bearing: Weight bearing as tolerated    Mobility  Bed Mobility               General bed mobility comments: Pt in chair upon entering room and returned to chair after session.  Transfers Overall transfer level: Needs assistance Equipment used: Rolling walker (2 wheeled) Transfers: Sit to/from Stand Sit to Stand: Supervision         General transfer comment: Supervision for safety.  Ambulation/Gait Ambulation/Gait assistance: Min guard Ambulation Distance (Feet): 200 Feet Assistive device: Rolling walker (2 wheeled) Gait Pattern/deviations: Step-through pattern;Decreased step length - left;Decreased stance time - right;Decreased weight shift to right     General Gait Details: Cues for safety with RW as pt tends to bump into objects. Cues to initiate right knee extension during stance phase of gait.   Stairs            Wheelchair Mobility    Modified Rankin (Stroke Patients Only)       Balance                                    Cognition Arousal/Alertness: Awake/alert Behavior During Therapy: WFL for tasks assessed/performed Overall Cognitive Status: Within Functional Limits for tasks assessed                      Exercises Total Joint Exercises Quad  Sets: Both;10 reps;Seated Heel Slides: AROM;Right;10 reps;Seated Hip ABduction/ADduction: AROM;Right;10 reps;Seated Straight Leg Raises: AROM;Right;10 reps;Seated Long Arc Quad: AROM;Right;10 reps;Seated    General Comments        Pertinent Vitals/Pain Pain Score: 5  Pain Location: R knee Pain Descriptors / Indicators: Sore Pain Intervention(s): Monitored during session    Home Living                      Prior Function            PT Goals (current goals can now be found in the care plan section) Progress towards PT goals: Progressing toward goals    Frequency  7X/week    PT Plan Current plan remains appropriate    Co-evaluation             End of Session Equipment Utilized During Treatment: Gait belt Activity Tolerance: Patient tolerated treatment well Patient left: in chair;with call bell/phone within reach;with family/visitor present     Time: 4098-1191 PT Time Calculation (min) (ACUTE ONLY): 23 min  Charges:                       G CodesNita Sells, SPTA 2015-02-15, 9:26 AM

## 2015-02-07 NOTE — Evaluation (Signed)
Occupational Therapy Evaluation Patient Details Name: Crystal Hess MRN: [id number] DOB: [birth year] Today's Date: 02/07/2015    History of Present Illness 81 y.o. s/p Rt TKA.   Clinical Impression   Pt s/p above. Education provided in session and pt and her brother verbalize understanding. Feel pt is safe to d/c home, with family available to assist. OT signing off.    Follow Up Recommendations  No OT follow up;Supervision - Intermittent    Equipment Recommendations  None recommended by OT    Recommendations for Other Services       Precautions / Restrictions Precautions Precautions: Fall;Knee (Allergic to polyester, cotton products only) Restrictions Weight Bearing Restrictions: Yes RLE Weight Bearing: Weight bearing as tolerated      Mobility Bed Mobility               General bed mobility comments: not assessed  Transfers Overall transfer level: Needs assistance Transfers: Sit to/from Stand Sit to Stand: Min guard         General transfer comment: Min guard for safety.  Balance      Pt used walker for ambulation.                                       ADL Overall ADL's : Needs assistance/impaired                     Lower Body Dressing: Min guard;Sit to/from stand   Toilet Transfer: Min guard;Ambulation;RW;BSC (chair)       Tub/ Shower Transfer: Walk-in shower;Min guard;Ambulation;3 in 1;Rolling walker   Functional mobility during ADLs: Min guard;Rolling walker General ADL Comments: Educated on LB dressing technique. Educated on benefit of reaching to right foot for LB dressing as it allows knee to bend. Educated on safety such as safe footwear, use of bag on walker, sitting for LB ADLs, and recommended someone be with her for shower transfer. Educated on tub and shower transfer techniques and options for shower chair. Discussed 3 in 1. Educated on knee precautions. Explained AE is available if needed. Placed pt in  footsie roll at end of session.     Vision     Perception     Praxis      Pertinent Vitals/Pain Pain Assessment: 0-10 Pain Score: 3  Pain Location: right knee Pain Descriptors / Indicators: Sore;Throbbing Pain Intervention(s): Monitored during session     Hand Dominance     Extremity/Trunk Assessment Upper Extremity Assessment Upper Extremity Assessment: Overall WFL for tasks assessed   Lower Extremity Assessment Lower Extremity Assessment: Defer to PT evaluation       Communication Communication Communication: No difficulties   Cognition Arousal/Alertness: Awake/alert Behavior During Therapy: WFL for tasks assessed/performed Overall Cognitive Status: Within Functional Limits for tasks assessed                     General Comments       Exercises       Shoulder Instructions      Home Living Family/patient expects to be discharged to:: Private residence Living Arrangements: Alone Available Help at Discharge: Family (brother to stay with upon d/c, sister lives next door) Type of Home: House Home Access: Stairs to enter Secretary/administrator of Steps: 3 Entrance Stairs-Rails: Right Home Layout: One level     Bathroom Shower/Tub: Tub/shower unit;Walk-in shower Shower/tub characteristics: Door Bathroom Toilet: Handicapped height (sink  close)     Home Equipment: Walker - 2 wheels;Bedside commode;Grab bars - tub/shower (BSC delivered to room)          Prior Functioning/Environment Level of Independence: Independent             OT Diagnosis: Acute pain   OT Problem List:     OT Treatment/Interventions:      OT Goals(Current goals can be found in the care plan section)    OT Frequency:     Barriers to D/C:            Co-evaluation              End of Session Equipment Utilized During Treatment: Gait belt;Rolling walker CPM Right Knee CPM Right Knee: Off  Activity Tolerance: Patient limited by pain  Patient left: in  chair;with family/visitor present   Time: 1610-9604 OT Time Calculation (min): 21 min Charges:  OT General Charges $OT Visit: 1 Procedure OT Evaluation $Initial OT Evaluation Tier I: 1 Procedure G-CodesEarlie Raveling OTR/L [phone] 02/07/2015, 10:30 AM

## 2015-02-07 NOTE — Progress Notes (Signed)
CSW (Clinical Child psychotherapist) aware of consult. At this time, PT is recommending HH services. Pt has no social work needs. Please reconsult should needs arise.   Coline Calkin, LCSWA [phone]

## 2015-02-07 NOTE — Progress Notes (Signed)
SPORTS MEDICINE AND JOINT REPLACEMENT  Georgena Spurling, MD   Altamese Cabal, PA-C [address] [place], [place], [place]  [address]                             [phone]   PROGRESS NOTE  Subjective:  negative for Chest Pain  negative for Shortness of Breath  negative for Nausea/Vomiting   negative for Calf Pain  negative for Bowel Movement   Tolerating Diet: yes         Patient reports pain as 4 on 0-10 scale.    Objective: Vital signs in last 24 hours:   Patient Vitals for the past 24 hrs:  BP Temp Temp src Pulse Resp SpO2  02/07/15 1300 (!) 158/55 mmHg 98.2 F (36.8 C) - 91 18 96 %  02/07/15 0845 - - - - - 97 %  02/07/15 0600 (!) 162/45 mmHg 98.9 F (37.2 C) - 80 18 95 %  02/07/15 0240 (!) 163/53 mmHg - - - - -  02/07/15 0147 (!) 182/54 mmHg 98.1 F (36.7 C) - 82 18 95 %  02/06/15 2129 (!) 185/60 mmHg 97.5 F (36.4 C) - 83 18 100 %  02/06/15 1635 (!) 175/65 mmHg 98.3 F (36.8 C) Oral 70 18 100 %  02/06/15 1456 - - - - - 99 %    {1959:LAST@   Intake/Output from previous day:   08/01 0701 - 08/02 0700 In: 3387.5 [P.O.:960; I.V.:2377.5] Out: 500 [Drains:450]   Intake/Output this shift:   08/02 0701 - 08/02 1900 In: 480 [P.O.:480] Out: -    Intake/Output      08/01 0701 - 08/02 0700 08/02 0701 - 08/03 0700   P.O. 960 480   I.V. 2377.5    IV Piggyback 50    Total Intake 3387.5 480   Urine 0    Drains 450    Blood 50    Total Output 500     Net +2887.5 +480        Urine Occurrence 5 x 1 x      LABORATORY DATA:  Recent Labs  02/07/15 0505  WBC 10.5  HGB 9.8*  HCT 29.4*  PLT 160    Recent Labs  02/07/15 0505  NA 134*  K 4.1  CL 101  CO2 25  BUN 11  CREATININE 0.62  GLUCOSE 157*  CALCIUM 8.6*   Lab Results  Component Value Date   INR 1.23 01/30/2015    Examination:  General appearance: alert, cooperative and no distress Extremities: Homans sign is negative, no sign of DVT  Wound Exam: clean, dry, intact   Drainage:  None:  wound tissue dry  Motor Exam: EHL and FHL Intact  Sensory Exam: Deep Peroneal normal   Assessment:    1 Day Post-Op  Procedure(s) (LRB): RIGHT TOTAL KNEE ARTHROPLASTY (Right)  ADDITIONAL DIAGNOSIS:  Active Problems:   S/P total knee arthroplasty  Acute Blood Loss Anemia   Plan: Physical Therapy as ordered Weight Bearing as Tolerated (WBAT)    DISCHARGE PLAN: Home  DISCHARGE NEEDS: HHPT, CPM, Walker and 3-in-1 comode seat         Crystal Hess 02/07/2015, 2:28 PM

## 2015-02-07 NOTE — Discharge Instructions (Signed)

## 2015-02-07 NOTE — Care Management Note (Signed)
Case Management Note  Patient Details  Name: Crystal Hess MRN: [id number] Date of Birth: May 02, 1930  Subjective/Objective:     81 yr old female s/p right total knee arthroplasty.   Action/Plan:  Case manager spoke with patient concerning home health and DME needs. Patient has her own rolling walker. 3in1 and CPM to be delivered. Patient states her nephew and wife live with her, and her brother will also be staying to assist her for two weeks.    Expected Discharge Date:    02/08/15              Expected Discharge Plan:   Home with Home Health  In-House Referral:  NA  Discharge planning Services  CM Consult  Post Acute Care Choice:  Home Health Choice offered to:  Patient  DME Arranged:  3-N-1, CPM DME Agency:  [employer]  HH Arranged:  PT HH Agency:  [facility]  Status of Service:  Completed, signed off  Medicare Important Message Given:    Date Medicare IM Given:    Medicare IM give by:    Date Additional Medicare IM Given:    Additional Medicare Important Message give by:     If discussed at Long Length of Stay Meetings, dates discussed:    Additional Comments:  Durenda Guthrie, RN 02/07/2015, 11:11 AM

## 2015-02-07 NOTE — Plan of Care (Signed)
Problem: Consults Goal: Diagnosis- Total Joint Replacement Outcome: Completed/Met Date Met:  02/07/15 Primary Total Knee Right

## 2015-02-15 NOTE — Op Note (Signed)
TOTAL KNEE REPLACEMENT OPERATIVE NOTE:  02/06/2015  11:06 AM  PATIENT:  Crystal Hess  81 y.o. female  PRE-OPERATIVE DIAGNOSIS:  primary osteoarthritis right knee  POST-OPERATIVE DIAGNOSIS:  primary osteoarthritis right knee  PROCEDURE:  Procedure(s): RIGHT TOTAL KNEE ARTHROPLASTY  SURGEON:  Surgeon(s): Dannielle Huh, MD  PHYSICIAN ASSISTANT: Altamese Cabal, [facility]  ANESTHESIA:   spinal  DRAINS: Hemovac  SPECIMEN: None  COUNTS:  Correct  TOURNIQUET:   Total Tourniquet Time Documented: Thigh (Right) - 58 minutes Total: Thigh (Right) - 58 minutes   DICTATION:  Indication for procedure:    The patient is a 81 y.o. female who has failed conservative treatment for primary osteoarthritis right knee.  Informed consent was obtained prior to anesthesia. The risks versus benefits of the operation were explain and in a way the patient can, and did, understand.   On the implant demand matching protocol, this patient scored 8.  Therefore, this patient did" "did not receive a polyethylene insert with vitamin E which is a high demand implant.  Description of procedure:     The patient was taken to the operating room and placed under anesthesia.  The patient was positioned in the usual fashion taking care that all body parts were adequately padded and/or protected.  I foley catheter was not placed.  A tourniquet was applied and the leg prepped and draped in the usual sterile fashion.  The extremity was exsanguinated with the esmarch and tourniquet inflated to 350 mmHg.  Pre-operative range of motion was normal.  The knee was in 5 degree of mild varus.  A midline incision approximately 6-7 inches long was made with a #10 blade.  A new blade was used to make a parapatellar arthrotomy going 2-3 cm into the quadriceps tendon, over the patella, and alongside the medial aspect of the patellar tendon.  A synovectomy was then performed with the #10 blade and forceps. I then elevated the deep MCL  off the medial tibial metaphysis subperiosteally around to the semimembranosus attachment.    I everted the patella and used calipers to measure patellar thickness.  I used the reamer to ream down to appropriate thickness to recreate the native thickness.  I then removed excess bone with the rongeur and sagittal saw.  I used the appropriately sized template and drilled the three lug holes.  I then put the trial in place and measured the thickness with the calipers to ensure recreation of the native thickness.  The trial was then removed and the patella subluxed and the knee brought into flexion.  A homan retractor was place to retract and protect the patella and lateral structures.  A Z-retractor was place medially to protect the medial structures.  The extra-medullary alignment system was used to make cut the tibial articular surface perpendicular to the anamotic axis of the tibia and in 3 degrees of posterior slope.  The cut surface and alignment jig was removed.  I then used the intramedullary alignment guide to make a 6 valgus cut on the distal femur.  I then marked out the epicondylar axis on the distal femur.  The posterior condylar axis measured 3 degrees.  I then used the anterior referencing sizer and measured the femur to be a size 7.  The 4-In-1 cutting block was screwed into place in external rotation matching the posterior condylar angle, making our cuts perpendicular to the epicondylar axis.  Anterior, posterior and chamfer cuts were made with the sagittal saw.  The cutting block and cut  pieces were removed.  A lamina spreader was placed in 90 degrees of flexion.  The ACL, PCL, menisci, and posterior condylar osteophytes were removed.  A 11 mm spacer blocked was found to offer good flexion and extension gap balance after mild in degree releasing.   The scoop retractor was then placed and the femoral finishing block was pinned in place.  The small sagittal saw was used as well as the lug drill  to finish the femur.  The block and cut surfaces were removed and the medullary canal hole filled with autograft bone from the cut pieces.  The tibia was delivered forward in deep flexion and external rotation.  A size E tray was selected and pinned into place centered on the medial 1/3 of the tibial tubercle.  The reamer and keel was used to prepare the tibia through the tray.    I then trialed with the size 7 femur, size E tibia, a 11 mm insert and the 35 patella.  I had excellent flexion/extension gap balance, excellent patella tracking.  Flexion was full and beyond 120 degrees; extension was zero.  These components were chosen and the staff opened them to me on the back table while the knee was lavaged copiously and the cement mixed.  The soft tissue was infiltrated with 60cc of exparel 1.3% through a 21 gauge needle.  I cemented in the components and removed all excess cement.  The polyethylene tibial component was snapped into place and the knee placed in extension while cement was hardening.  The capsule was infilltrated with 30cc of .25% Marcaine with epinephrine.  A hemovac was place in the joint exiting superolaterally.  A pain pump was place superomedially superficial to the arthrotomy.  Once the cement was hard, the tourniquet was let down.  Hemostasis was obtained.  The arthrotomy was closed with figure-8 #1 vicryl sutures.  The deep soft tissues were closed with #0 vicryls and the subcuticular layer closed with a running #2-0 vicryl.  The skin was reapproximated and closed with skin staples.  The wound was dressed with xeroform, 4 x4's, 2 ABD sponges, a single layer of webril and a TED stocking.   The patient was then awakened, extubated, and taken to the recovery room in stable condition.  BLOOD LOSS:  300cc DRAINS: 1 hemovac, 1 pain catheter COMPLICATIONS:  None.  PLAN OF CARE: Admit to inpatient   PATIENT DISPOSITION:  PACU - hemodynamically stable.   Delay start of Pharmacological  VTE agent (>24hrs) due to surgical blood loss or risk of bleeding:  not applicable  Please fax a copy of this op note to my office at [phone] (please only include page 1 and 2 of the Case Information op note)

## 2015-02-17 ENCOUNTER — Other Ambulatory Visit: Payer: Self-pay | Admitting: Internal Medicine

## 2015-02-28 NOTE — Discharge Summary (Signed)
SPORTS MEDICINE & JOINT REPLACEMENT   Georgena Spurling, MD   Altamese Cabal, PA-C [address] [place], [place], [place]  [address]                             [phone]  PATIENT ID: Crystal Hess        MRN:  [id number]          DOB/AGE: 1929-10-15 / 81 y.o.    DISCHARGE SUMMARY  ADMISSION DATE:    02/06/2015 DISCHARGE DATE:  02/07/2015  ADMISSION DIAGNOSIS: primary osteoarthritis right knee    DISCHARGE DIAGNOSIS:  primary osteoarthritis right knee    ADDITIONAL DIAGNOSIS: Active Problems:   S/P total knee arthroplasty  Past Medical History  Diagnosis Date  . Hypertension   . Asthma   . DOE (dyspnea on exertion)     wears O2 at night but has not had a sleep study  . Mitral regurgitation     mild  . Trace tricuspid regurgitation by prior echocardiogram   . Persistent atrial fibrillation   . Obesity   . Hyperthyroidism   . Family history of adverse reaction to anesthesia     "sister's heart stopped and had to be revived; think it was related to the demoral"  . Walking pneumonia ~ 1998  . On home oxygen therapy     "2L just at night" (11/08/2014)  . DJD (degenerative joint disease)   . Arthritis     "knees, hands" (11/08/2014)  . Chronic lower back pain   . Gout     "I've had it twice already this year" (11/08/2014)  . Dysrhythmia   . Anxiety     PROCEDURE: Procedure(s): RIGHT TOTAL KNEE ARTHROPLASTY on 02/06/2015  CONSULTS:     HISTORY:  See H&P in chart  HOSPITAL COURSE:  Crystal Hess is a 81 y.o. admitted on 02/06/2015 and found to have a diagnosis of primary osteoarthritis right knee.  After appropriate laboratory studies were obtained  they were taken to the operating room on 02/06/2015 and underwent Procedure(s): RIGHT TOTAL KNEE ARTHROPLASTY.   They were given perioperative antibiotics:  Anti-infectives    Start     Dose/Rate Route Frequency Ordered Stop   02/06/15 1500  ceFAZolin (ANCEF) IVPB 1 g/50 mL premix     1 g 100 mL/hr over 30 Minutes  Intravenous Every 6 hours 02/06/15 1237 02/06/15 2211   02/06/15 0630  ceFAZolin (ANCEF) IVPB 2 g/50 mL premix     2 g 100 mL/hr over 30 Minutes Intravenous To [facility] 02/03/15 1056 02/06/15 0910    .  Tolerated the procedure well.  Placed with a foley intraoperatively.  Given Ofirmev at induction and for 48 hours.    POD# 1: Vital signs were stable.  Patient denied Chest pain, shortness of breath, or calf pain.  Patient was started on Lovenox 30 mg subcutaneously twice daily at 8am.  Consults to PT, OT, and care management were made.  The patient was weight bearing as tolerated.  CPM was placed on the operative leg 0-90 degrees for 6-8 hours a day.  Incentive spirometry was taught.  Dressing was changed.  Hemovac was discontinued.      POD #2, Continued  PT for ambulation and exercise program.  IV saline locked.  O2 discontinued.    The remainder of the hospital course was dedicated to ambulation and strengthening.   The patient was discharged on 2 days post  op in  Good condition.  Blood products given:none  DIAGNOSTIC STUDIES: Recent vital signs: No data found.      Recent laboratory studies: No results for input(s): WBC, HGB, HCT, PLT in the last 168 hours. No results for input(s): NA, K, CL, CO2, BUN, CREATININE, GLUCOSE, CALCIUM in the last 168 hours. Lab Results  Component Value Date   INR 1.23 01/30/2015     Recent Radiographic Studies :  No results found.  DISCHARGE INSTRUCTIONS: Discharge Instructions    CPM    Complete by:  As directed   Continuous passive motion machine (CPM):      Use the CPM from 0 to 90 for 6-8 hours per day.      You may increase by 10 per day.  You may break it up into 2 or 3 sessions per day.      Use CPM for 2 weeks or until you are told to stop.     Call MD / Call 911    Complete by:  As directed   If you experience chest pain or shortness of breath, CALL 911 and be transported to the hospital emergency room.  If you develope  a fever above 101 F, pus (white drainage) or increased drainage or redness at the wound, or calf pain, call your surgeon's office.     Change dressing    Complete by:  As directed   Change dressing on wednesday, then change the dressing daily with sterile 4 x 4 inch gauze dressing and apply TED hose.     Constipation Prevention    Complete by:  As directed   Drink plenty of fluids.  Prune juice may be helpful.  You may use a stool softener, such as Colace (over the counter) 100 mg twice a day.  Use MiraLax (over the counter) for constipation as needed.     Diet - low sodium heart healthy    Complete by:  As directed      Do not put a pillow under the knee. Place it under the heel.    Complete by:  As directed      Driving restrictions    Complete by:  As directed   No driving for 6 weeks     Increase activity slowly as tolerated    Complete by:  As directed      Lifting restrictions    Complete by:  As directed   No lifting for 6 weeks     TED hose    Complete by:  As directed   Use stockings (TED hose) for 2 weeks on both leg(s).  You may remove them at night for sleeping.           DISCHARGE MEDICATIONS:     Medication List    STOP taking these medications        Fish Oil 600 MG Caps     potassium chloride SA 20 MEQ tablet  Commonly known as:  K-DUR,KLOR-CON      TAKE these medications        acetaminophen 650 MG CR tablet  Commonly known as:  TYLENOL  Take 650 mg by mouth 2 (two) times daily.     ALLEGRA ALLERGY PO  Take 1 tablet by mouth at bedtime.     allopurinol 300 MG tablet  Commonly known as:  ZYLOPRIM  Take 300 mg by mouth daily.     beclomethasone 80 MCG/ACT inhaler  Commonly known as:  QVAR  INHALE  1 PUFF BY MOUTH TWICE DAILY     CALCIUM 600 + MINERALS 600-200 MG-UNIT Tabs  Take 1 tablet by mouth daily.     clobetasol ointment 0.05 %  Commonly known as:  TEMOVATE  Apply 1 application topically 2 (two) times daily as needed (for rash).      dofetilide 500 MCG capsule  Commonly known as:  TIKOSYN  Take 1 capsule (500 mcg total) by mouth 2 (two) times daily.     ELIQUIS 5 MG Tabs tablet  Generic drug:  apixaban  Take 5 mg by mouth 2 (two) times daily.     FORADIL AEROLIZER 12 MCG capsule for inhaler  Generic drug:  formoterol  INHALE CONTENTS OF CAPSULE TWICE DAILY     furosemide 20 MG tablet  Commonly known as:  LASIX  TAKE 1 TABLET(20 MG) BY MOUTH DAILY     furosemide 20 MG tablet  Commonly known as:  LASIX  Take 1 tablet (20 mg total) by mouth daily.     magnesium oxide 400 (241.3 MG) MG tablet  Commonly known as:  MAG-OX  Take 1 tablet (400 mg total) by mouth daily.     methimazole 5 MG tablet  Commonly known as:  TAPAZOLE  Take 5 mg by mouth at bedtime.     methocarbamol 500 MG tablet  Commonly known as:  ROBAXIN  Take 1-2 tablets (500-1,000 mg total) by mouth every 6 (six) hours as needed for muscle spasms.     oxyCODONE 5 MG immediate release tablet  Commonly known as:  Oxy IR/ROXICODONE  Take 1-2 tablets (5-10 mg total) by mouth every 4 (four) hours as needed for breakthrough pain.     OxyCODONE 10 mg T12a 12 hr tablet  Commonly known as:  OXYCONTIN  Take 1 tablet (10 mg total) by mouth every 12 (twelve) hours.     PROVENTIL HFA 108 (90 BASE) MCG/ACT inhaler  Generic drug:  albuterol  INHALE 2 PUFFS INTO LUNGS EVERY 6 HOURS AS NEEDED FOR WHEEZING AND SHORTNESS OF BREATH     valsartan 160 MG tablet  Commonly known as:  DIOVAN  Take 1 tablet (160 mg total) by mouth daily.        FOLLOW UP VISIT:       Follow-up Information    Follow up with [facility].   Why:  Someone from [facility] will contact you concerning start date and time for therapy.   Contact information:   [address] [place] [address] [phone]       Follow up with Raymon Mutton, MD. Call on 02/21/2015.   Specialty:  Orthopedic Surgery   Contact information:   [address] [place]  [address] [place] [address] [phone]       DISPOSITION: HOME  CONDITION:  Good   Fabrice Dyal 02/28/2015, 1:26 PM

## 2015-03-17 ENCOUNTER — Other Ambulatory Visit: Payer: Self-pay | Admitting: Internal Medicine

## 2015-04-17 ENCOUNTER — Other Ambulatory Visit: Payer: Self-pay | Admitting: Internal Medicine

## 2015-05-14 ENCOUNTER — Other Ambulatory Visit: Payer: Self-pay | Admitting: Internal Medicine

## 2015-05-17 ENCOUNTER — Other Ambulatory Visit: Payer: Self-pay

## 2015-05-17 MED ORDER — MAGNESIUM OXIDE 400 (241.3 MG) MG PO TABS: 400.0000 mg | ORAL_TABLET | Freq: Every day | ORAL | Status: DC

## 2015-05-22 ENCOUNTER — Other Ambulatory Visit: Payer: Self-pay | Admitting: Internal Medicine

## 2015-09-11 ENCOUNTER — Telehealth: Payer: Self-pay | Admitting: Internal Medicine

## 2015-09-11 NOTE — Telephone Encounter (Signed)
New message      Need order for overnight oximetry off oxygen.  OK to put order in epic.  Patient states she is not using oxygen anymore

## 2015-09-11 NOTE — Telephone Encounter (Addendum)
Called Mandy back about patient's overnight oximetry. Dr. Jacinto HalimGanji put in original order for oxygen, but not sure if patient is still seeing Dr. Jacinto HalimGanji. Angelica ChessmanMandy will call Dr. Verl DickerGanji's office to clarify and will give us a call back if patient is not seeing Dr. Jacinto HalimGanji any more.

## 2015-09-21 ENCOUNTER — Other Ambulatory Visit: Payer: Self-pay

## 2015-09-21 ENCOUNTER — Other Ambulatory Visit: Payer: Self-pay | Admitting: Internal Medicine

## 2015-09-21 MED ORDER — MAGNESIUM OXIDE 400 (241.3 MG) MG PO TABS: 400.0000 mg | ORAL_TABLET | Freq: Every day | ORAL | Status: DC

## 2015-09-21 MED ORDER — FUROSEMIDE 20 MG PO TABS: ORAL_TABLET | ORAL | Status: DC

## 2015-10-27 ENCOUNTER — Other Ambulatory Visit: Payer: Self-pay | Admitting: *Deleted

## 2015-10-27 MED ORDER — DOFETILIDE 500 MCG PO CAPS: 500.0000 ug | ORAL_CAPSULE | Freq: Two times a day (BID) | ORAL | Status: DC

## 2015-12-17 NOTE — Progress Notes (Signed)
Patient Care Team: Barbie Banner, MD as PCP - General (Family Medicine)   HPI  Crystal Hess is a 82 y.o. female With atrial fibrillation  Being maintained on dofetilide.  No significant recurrences  Because of her propensity to bradycardia she was started on dofetilide and underwent cardioversion 5/ 16  She feels considerably better   in sinus rhythm  The patient denies chest pain, shortness of breath, nocturnal dyspnea, orthopnea or peripheral edema.  There have been no palpitations, lightheadedness or syncope.   Moving to [facility] to live near younger brother and sister  Last Hgb 14>>>9.8  ----  8/16  Past Medical History  Diagnosis Date  . Hypertension   . Asthma   . DOE (dyspnea on exertion)     wears O2 at night but has not had a sleep study  . Mitral regurgitation     mild  . Trace tricuspid regurgitation by prior echocardiogram   . Persistent atrial fibrillation (HCC)   . Obesity   . Hyperthyroidism   . Family history of adverse reaction to anesthesia     "sister's heart stopped and had to be revived; think it was related to the demoral"  . Walking pneumonia ~ 1998  . On home oxygen therapy     "2L just at night" (11/08/2014)  . DJD (degenerative joint disease)   . Arthritis     "knees, hands" (11/08/2014)  . Chronic lower back pain   . Gout     "I've had it twice already this year" (11/08/2014)  . Dysrhythmia   . Anxiety     Past Surgical History  Procedure Laterality Date  . Excisional hemorrhoidectomy  ~ 1960  . Ovarian cyst removal Right 1963    "gangrene"  . Shoulder arthroscopy Left ~ 2008    bone spur  . Knee cartilage surgery Right 1976    "cut it open"  . Tonsillectomy    . Cardioversion N/A 07/26/2014    Procedure: CARDIOVERSION;  Surgeon: Pamella Pert, MD;  Location: [facility] ENDOSCOPY;  Service: Cardiovascular;  Laterality: N/A;  . Cardioversion N/A 08/30/2014    Procedure: CARDIOVERSION;  Surgeon: Pamella Pert, MD;  Location: [facility]  ENDOSCOPY;  Service: Cardiovascular;  Laterality: N/A;  . Cataract extraction w/ intraocular lens  implant, bilateral Bilateral ~ 2006  . Eye surgery    . Total knee arthroplasty Right 02/06/2015    Procedure: RIGHT TOTAL KNEE ARTHROPLASTY;  Surgeon: Dannielle Huh, MD;  Location: [facility] OR;  Service: Orthopedics;  Laterality: Right;    Current Outpatient Prescriptions  Medication Sig Dispense Refill  . acetaminophen (TYLENOL) 650 MG CR tablet Take 650 mg by mouth 2 (two) times daily.    Marland Kitchen allopurinol (ZYLOPRIM) 300 MG tablet Take 300 mg by mouth daily.     . Calcium Carbonate-Vit D-Min (CALCIUM 600 + MINERALS) 600-200 MG-UNIT TABS Take 1 tablet by mouth daily.    . clobetasol ointment (TEMOVATE) 0.05 % Apply 1 application topically 2 (two) times daily as needed (for rash).     . dofetilide (TIKOSYN) 500 MCG capsule Take 1 capsule (500 mcg total) by mouth 2 (two) times daily. 60 capsule 3  . ELIQUIS 5 MG TABS tablet Take 5 mg by mouth 2 (two) times daily.  5  . Fexofenadine HCl (ALLEGRA ALLERGY PO) Take 1 tablet by mouth at bedtime.     . furosemide (LASIX) 20 MG tablet Take 1 tablet (20 mg total) by mouth daily. 30 tablet  3  . magnesium oxide (MAG-OX) 400 (241.3 Mg) MG tablet Take 1 tablet (400 mg total) by mouth daily. 30 tablet 5  . methimazole (TAPAZOLE) 5 MG tablet Take 5 mg by mouth at bedtime.   2  . potassium chloride SA (K-DUR,KLOR-CON) 20 MEQ tablet TAKE 1 TABLET(20 MEQ) BY MOUTH DAILY 30 tablet 10  . PROVENTIL HFA 108 (90 BASE) MCG/ACT inhaler INHALE 2 PUFFS INTO LUNGS EVERY 6 HOURS AS NEEDED FOR WHEEZING AND SHORTNESS OF BREATH 6.7 g 2  . valsartan (DIOVAN) 160 MG tablet TAKE 1 TABLET BY MOUTH EVERY DAY 30 tablet 8   No current facility-administered medications for this visit.    Allergies  Allergen Reactions  . Meperidine And Related Other (See Comments)    "have hallucinations; get combative; I don't want that"  . Other Hives    Polyester- severe   . Codeine Nausea Only  .  Methotrexate Derivatives Other (See Comments)    Restless  . Vasculera [Nutritional Supplements] Other (See Comments)    Unknown    Review of Systems negative except from HPI and PMH  Physical Exam BP 138/62 mmHg  Pulse 64  Ht 5' 3.5" (1.613 m)  Wt 159 lb 12.8 oz (72.485 kg)  BMI 27.86 kg/m2 Well developed and well nourished in no acute distress HENT normal E scleral and icterus clear Neck Supple JVP flat; carotids brisk and full Clear to ausculation  Regular rate and rhythm, no murmurs gallops or rub Soft with active bowel sounds No clubbing cyanosis none Edema Alert and oriented, grossly normal motor and sensory function Skin Warm and Dry  ECG today demonstrated sinus rhythm at 63 intervals 22/07/43 Otherwise normal  Assessment and  Plan  Paroxysmal atrial fibrillation  HFpEF  Anemia      Euvolemic continue current meds  She is tolerating dofetilide well. She is maintaining sinus rhythm. We will recheck  Will recheck HgB   mving to [place] so willnot see again except at her request

## 2015-12-18 ENCOUNTER — Encounter: Payer: Self-pay | Admitting: Internal Medicine

## 2015-12-18 ENCOUNTER — Ambulatory Visit (INDEPENDENT_AMBULATORY_CARE_PROVIDER_SITE_OTHER): Payer: Medicare Other | Admitting: Internal Medicine

## 2015-12-18 VITALS — BP 138/62 | HR 64 | Ht 63.5 in | Wt 159.8 lb

## 2015-12-18 DIAGNOSIS — I480 Paroxysmal atrial fibrillation: Secondary | ICD-10-CM

## 2015-12-18 DIAGNOSIS — I10 Essential (primary) hypertension: Secondary | ICD-10-CM | POA: Diagnosis not present

## 2015-12-18 LAB — CBC WITH DIFFERENTIAL/PLATELET
BASOS PCT: 0 %
Basophils Absolute: 0 cells/uL (ref 0–200)
EOS PCT: 4 %
Eosinophils Absolute: 232 cells/uL (ref 15–500)
HEMATOCRIT: 38.1 % (ref 35.0–45.0)
HEMOGLOBIN: 13 g/dL (ref 11.7–15.5)
Lymphocytes Relative: 43 %
Lymphs Abs: 2494 cells/uL (ref 850–3900)
MCH: 30.5 pg (ref 27.0–33.0)
MCHC: 34.1 g/dL (ref 32.0–36.0)
MCV: 89.4 fL (ref 80.0–100.0)
MONO ABS: 464 {cells}/uL (ref 200–950)
MPV: 12.1 fL (ref 7.5–12.5)
Monocytes Relative: 8 %
Neutro Abs: 2610 cells/uL (ref 1500–7800)
Neutrophils Relative %: 45 %
Platelets: 196 10*3/uL (ref 140–400)
RBC: 4.26 MIL/uL (ref 3.80–5.10)
RDW: 15.3 % — AB (ref 11.0–15.0)
WBC: 5.8 10*3/uL (ref 3.8–10.8)

## 2015-12-18 LAB — BASIC METABOLIC PANEL
BUN: 19 mg/dL (ref 7–25)
CO2: 22 mmol/L (ref 20–31)
Calcium: 9.2 mg/dL (ref 8.6–10.4)
Chloride: 108 mmol/L (ref 98–110)
Creat: 0.63 mg/dL (ref 0.60–0.88)
Glucose, Bld: 147 mg/dL — ABNORMAL HIGH (ref 65–99)
POTASSIUM: 4.1 mmol/L (ref 3.5–5.3)
Sodium: 138 mmol/L (ref 135–146)

## 2015-12-18 LAB — MAGNESIUM: Magnesium: 2 mg/dL (ref 1.5–2.5)

## 2015-12-18 NOTE — Patient Instructions (Signed)
Medication Instructions: - Your physician recommends that you continue on your current medications as directed. Please refer to the Current Medication list given to you today.  Labwork: - Your physician recommends that you have lab work today: BMP/ CBC/ Magnesium  Procedures/Testing: - none  Follow-Up: - Dr. Graciela HusbandsKlein will see you back as needed  Any Additional Special Instructions Will Be Listed Below (If Applicable).     If you need a refill on your cardiac medications before your next appointment, please call your pharmacy.

## 2015-12-20 ENCOUNTER — Ambulatory Visit (INDEPENDENT_AMBULATORY_CARE_PROVIDER_SITE_OTHER): Payer: Medicare Other | Admitting: Podiatry

## 2015-12-20 ENCOUNTER — Encounter: Payer: Self-pay | Admitting: Podiatry

## 2015-12-20 DIAGNOSIS — M79675 Pain in left toe(s): Secondary | ICD-10-CM

## 2015-12-20 DIAGNOSIS — B351 Tinea unguium: Secondary | ICD-10-CM

## 2015-12-20 DIAGNOSIS — M79676 Pain in unspecified toe(s): Secondary | ICD-10-CM | POA: Diagnosis not present

## 2015-12-20 NOTE — Progress Notes (Signed)
Subjective:     Patient ID: Crystal Hess, female   DOB: 1930-03-24, 82 y.o.   MRN: [id number]  HPI this patient returns to the office with chief complaint of a painful long thick nail, which is growing in on the inside border the big toe left foot. He states this nail is painful walking and wearing her shoes. She previously had nail surgery for the correction of an ingrown nail on the ulnar border previously. ago. She now presents the office today for an evaluation and treatment of this painful thick nail   Review of Systems     Objective:   Physical Exam GENERAL APPEARANCE: Alert, conversant. Appropriately groomed. No acute distress.  VASCULAR: Pedal pulses are  palpable at  [facility] and PT bilateral.  Capillary refill time is immediate to all digits,  Normal temperature gradient.  Digital hair growth is present bilateral  NEUROLOGIC: sensation is normal to 5.07 monofilament at 5/5 sites bilateral.  Light touch is intact bilateral, Muscle strength normal.  MUSCULOSKELETAL: acceptable muscle strength, tone and stability bilateral.  Intrinsic muscluature intact bilateral.  Rectus appearance of foot and digits noted bilateral.   DERMATOLOGIC: skin color, texture, and turgor are within normal limits.  No preulcerative lesions or ulcers  are seen, no interdigital maceration noted.  No open lesions present. . No drainage noted.  NAILS  Thick disfigured discolored nail left hallux.      Assessment:     Onychomycosis left hallux.    Plan:     Debridement and grinding of left hallux nail.  RTC prn   Helane GuntherGregory Oreoluwa Gilmer DPM

## 2016-04-10 ENCOUNTER — Other Ambulatory Visit: Payer: Self-pay | Admitting: *Deleted

## 2016-04-10 MED ORDER — FUROSEMIDE 20 MG PO TABS: 20.0000 mg | ORAL_TABLET | Freq: Every day | ORAL | 11 refills | Status: DC

## 2016-05-03 ENCOUNTER — Other Ambulatory Visit: Payer: Self-pay | Admitting: Internal Medicine

## 2016-05-03 MED ORDER — MAGNESIUM OXIDE 400 (241.3 MG) MG PO TABS: 400.0000 mg | ORAL_TABLET | Freq: Every day | ORAL | 8 refills | Status: DC

## 2017-02-21 ENCOUNTER — Other Ambulatory Visit: Payer: Self-pay | Admitting: Internal Medicine

## 2017-05-11 HISTORY — PX: PACEMAKER IMPLANT: EP1218

## 2017-07-07 ENCOUNTER — Other Ambulatory Visit: Payer: Self-pay | Admitting: Internal Medicine

## 2017-09-16 ENCOUNTER — Other Ambulatory Visit: Payer: Self-pay | Admitting: Internal Medicine

## 2017-09-17 NOTE — Telephone Encounter (Signed)
Should this be deferred to patients pcp as last office visit with Dr Graciela HusbandsKlein was 12/18/15 but patient is prn follow up? Please advise. Thanks, MI

## 2017-09-17 NOTE — Telephone Encounter (Signed)
We shouldn't fill this. Pt hasnt seen us since 2017 and [place].

## 2017-09-18 ENCOUNTER — Other Ambulatory Visit: Payer: Self-pay | Admitting: Internal Medicine

## 2018-03-25 ENCOUNTER — Encounter: Payer: Self-pay | Admitting: Internal Medicine

## 2018-04-02 ENCOUNTER — Encounter: Payer: Medicare Other | Admitting: Internal Medicine

## 2018-04-17 ENCOUNTER — Encounter: Payer: Self-pay | Admitting: Internal Medicine

## 2018-04-20 ENCOUNTER — Other Ambulatory Visit: Payer: Self-pay | Admitting: Family Medicine

## 2018-04-20 DIAGNOSIS — Z1231 Encounter for screening mammogram for malignant neoplasm of breast: Secondary | ICD-10-CM

## 2018-05-05 NOTE — Progress Notes (Signed)
Patient Care Team: Barbie Banner, MD as PCP - General (Family Medicine)   HPI  Crystal Hess is a 84 y.o. female seen to establish pacemaker follow-up.  Last seen 2017 she was moving to [place].  Had a history of atrial fibrillation treated with dofetilide.   Had no propensity to bradycardia she was started on dofetilide and underwent cardioversion 5/ 16  Underwent pacing [place] because of sinus bradycardia.  She is felt much better since.  No bleeding     Last Hgb 14>>>9.8  ----  8/16 Date Cr K Mg Hgb  10/19 0.74 4.7               Hx of anemia   Past Medical History:  Diagnosis Date  . Anxiety   . Arthritis    "knees, hands" (11/08/2014)  . Asthma   . Chronic lower back pain   . DJD (degenerative joint disease)   . DOE (dyspnea on exertion)    wears O2 at night but has not had a sleep study  . Dysrhythmia   . Family history of adverse reaction to anesthesia    "sister's heart stopped and had to be revived; think it was related to the demoral"  . Gout    "I've had it twice already this year" (11/08/2014)  . Hypertension   . Hyperthyroidism   . Mitral regurgitation    mild  . Obesity   . On home oxygen therapy    "2L just at night" (11/08/2014)  . Persistent atrial fibrillation   . Trace tricuspid regurgitation by prior echocardiogram   . Walking pneumonia ~ 1998    Past Surgical History:  Procedure Laterality Date  . CARDIOVERSION N/A 07/26/2014   Procedure: CARDIOVERSION;  Surgeon: Pamella Pert, MD;  Location: [facility] ENDOSCOPY;  Service: Cardiovascular;  Laterality: N/A;  . CARDIOVERSION N/A 08/30/2014   Procedure: CARDIOVERSION;  Surgeon: Pamella Pert, MD;  Location: [facility] ENDOSCOPY;  Service: Cardiovascular;  Laterality: N/A;  . CATARACT EXTRACTION W/ INTRAOCULAR LENS  IMPLANT, BILATERAL Bilateral ~ 2006  . EXCISIONAL HEMORRHOIDECTOMY  ~ 1960  . EYE SURGERY    . KNEE CARTILAGE SURGERY Right 1976   "cut it open"  . OVARIAN  CYST REMOVAL Right 1963   "gangrene"  . SHOULDER ARTHROSCOPY Left ~ 2008   bone spur  . TONSILLECTOMY    . TOTAL KNEE ARTHROPLASTY Right 02/06/2015   Procedure: RIGHT TOTAL KNEE ARTHROPLASTY;  Surgeon: Dannielle Huh, MD;  Location: [facility] OR;  Service: Orthopedics;  Laterality: Right;    Current Outpatient Medications  Medication Sig Dispense Refill  . acetaminophen (TYLENOL) 650 MG CR tablet Take 650 mg by mouth 2 (two) times daily.    Marland Kitchen allopurinol (ZYLOPRIM) 300 MG tablet Take 300 mg by mouth daily.     . Calcium Carbonate-Vit D-Min (CALCIUM 600 + MINERALS) 600-200 MG-UNIT TABS Take 1 tablet by mouth daily.    . clobetasol ointment (TEMOVATE) 0.05 % Apply 1 application topically 2 (two) times daily as needed (for rash).     . dofetilide (TIKOSYN) 250 MCG capsule Take 1 capsule by mouth 2 (two) times daily.  3  . ELIQUIS 5 MG TABS tablet Take 5 mg by mouth 2 (two) times daily.  5  . Fexofenadine HCl (ALLEGRA ALLERGY PO) Take 1 tablet by mouth at bedtime.     . furosemide (LASIX) 20 MG tablet Take 1 tablet by mouth every other day.    Marland Kitchen  irbesartan (AVAPRO) 300 MG tablet Take 300 mg by mouth daily.    Marland Kitchen MAGNESIUM-OXIDE 400 (241.3 Mg) MG tablet TAKE 1 TABLET(400 MG) BY MOUTH DAILY 30 tablet 0  . methimazole (TAPAZOLE) 5 MG tablet Take 5 mg by mouth at bedtime.   2  . potassium chloride SA (K-DUR,KLOR-CON) 20 MEQ tablet Take 1 tablet by mouth every other day.    Marland Kitchen PROVENTIL HFA 108 (90 BASE) MCG/ACT inhaler INHALE 2 PUFFS INTO LUNGS EVERY 6 HOURS AS NEEDED FOR WHEEZING AND SHORTNESS OF BREATH 6.7 g 2   No current facility-administered medications for this visit.     Allergies  Allergen Reactions  . Meperidine And Related Other (See Comments)    "have hallucinations; get combative; I don't want that"  . Other Hives    Polyester- severe   . Codeine Nausea Only  . Methotrexate Derivatives Other (See Comments)    Restless  . Vasculera [Nutritional Supplements] Other (See Comments)    Unknown      Review of Systems negative except from HPI and PMH  Physical Exam BP 132/76   Pulse 76   Ht 5' 3.5" (1.613 m)   Wt 168 lb (76.2 kg)   SpO2 96%   BMI 29.29 kg/m  Well developed and nourished in no acute distress HENT normal Neck supple with JVP-flat Clear Device pocket well healed; without hematoma or erythema.  There is no tethering  Regular rate and rhythm, no murmurs or gallops Abd-soft with active BS No Clubbing cyanosis edema Skin-warm and dry A & Oriented  Grossly normal sensory and motor function   ECG today demonstrated AV pacing`   Assessment and  Plan  Paroxysmal atrial fibrillation  HFpEF  Anemia  Pacemaker  Medtronic  The patient's device was interrogated and the information was fully reviewed.  The device was reprogrammed As Below   Sinus node dysfunction   Pt w intercurrent afib,  Will continue tikosyn and need to check survillance labs  Device reprogrammed to A>>DR with prolonged AV delay to promote intrinsic conduction  On Anticoagulation;  No bleeding issues hx of anemia Will recheck HgB   Euvolemic continue current meds  We spent more than 50% of our >25 min visit in face to face counseling regarding the above

## 2018-05-06 ENCOUNTER — Ambulatory Visit: Payer: Medicare Other | Admitting: Internal Medicine

## 2018-05-06 ENCOUNTER — Encounter: Payer: Self-pay | Admitting: Internal Medicine

## 2018-05-06 ENCOUNTER — Encounter (INDEPENDENT_AMBULATORY_CARE_PROVIDER_SITE_OTHER): Payer: Self-pay

## 2018-05-06 VITALS — BP 132/76 | HR 76 | Ht 63.5 in | Wt 168.0 lb

## 2018-05-06 DIAGNOSIS — Z950 Presence of cardiac pacemaker: Secondary | ICD-10-CM | POA: Diagnosis not present

## 2018-05-06 DIAGNOSIS — Z79899 Other long term (current) drug therapy: Secondary | ICD-10-CM

## 2018-05-06 DIAGNOSIS — I4819 Other persistent atrial fibrillation: Secondary | ICD-10-CM

## 2018-05-06 LAB — CUP PACEART INCLINIC DEVICE CHECK
Battery Remaining Longevity: 131 mo
Battery Voltage: 3.02 V
Brady Statistic AP VP Percent: 80.29 %
Brady Statistic AP VS Percent: 0.55 %
Brady Statistic AS VP Percent: 18.02 %
Brady Statistic AS VS Percent: 1.14 %
Brady Statistic RA Percent Paced: 79.98 %
Brady Statistic RV Percent Paced: 98.18 %
Date Time Interrogation Session: 20191030143053
Implantable Lead Implant Date: 20181105
Implantable Lead Implant Date: 20181105
Implantable Lead Location: 753859
Implantable Lead Location: 753860
Implantable Lead Model: 4574
Implantable Lead Model: 5076
Implantable Pulse Generator Implant Date: 20181105
Lead Channel Impedance Value: 399 Ohm
Lead Channel Impedance Value: 456 Ohm
Lead Channel Impedance Value: 475 Ohm
Lead Channel Impedance Value: 589 Ohm
Lead Channel Pacing Threshold Amplitude: 0.5 V
Lead Channel Pacing Threshold Amplitude: 0.75 V
Lead Channel Pacing Threshold Pulse Width: 0.4 ms
Lead Channel Pacing Threshold Pulse Width: 0.4 ms
Lead Channel Sensing Intrinsic Amplitude: 26.375 mV
Lead Channel Sensing Intrinsic Amplitude: 3.625 mV
Lead Channel Setting Pacing Amplitude: 1.5 V
Lead Channel Setting Pacing Amplitude: 2 V
Lead Channel Setting Pacing Pulse Width: 0.4 ms
Lead Channel Setting Sensing Sensitivity: 1.2 mV

## 2018-05-06 LAB — CBC
Hematocrit: 40.2 % (ref 34.0–46.6)
Hemoglobin: 13.3 g/dL (ref 11.1–15.9)
MCH: 29.6 pg (ref 26.6–33.0)
MCHC: 33.1 g/dL (ref 31.5–35.7)
MCV: 90 fL (ref 79–97)
Platelets: 241 10*3/uL (ref 150–450)
RBC: 4.49 x10E6/uL (ref 3.77–5.28)
RDW: 12.9 % (ref 12.3–15.4)
WBC: 5 10*3/uL (ref 3.4–10.8)

## 2018-05-06 LAB — BASIC METABOLIC PANEL
BUN/Creatinine Ratio: 19 (ref 12–28)
BUN: 14 mg/dL (ref 8–27)
CO2: 21 mmol/L (ref 20–29)
Calcium: 9.6 mg/dL (ref 8.7–10.3)
Chloride: 103 mmol/L (ref 96–106)
Creatinine, Ser: 0.73 mg/dL (ref 0.57–1.00)
GFR calc non Af Amer: 75 mL/min/{1.73_m2} (ref >59)
GFR, EST AFRICAN AMERICAN: 87 mL/min/{1.73_m2} (ref >59)
GLUCOSE: 114 mg/dL — AB (ref 65–99)
Potassium: 4.4 mmol/L (ref 3.5–5.2)
SODIUM: 140 mmol/L (ref 134–144)

## 2018-05-06 LAB — MAGNESIUM: Magnesium: 2 mg/dL (ref 1.6–2.3)

## 2018-05-06 NOTE — Patient Instructions (Addendum)
Medication Instructions:  Your physician recommends that you continue on your current medications as directed. Please refer to the Current Medication list given to you today.  If you need a refill on your cardiac medications before your next appointment, please call your pharmacy.   Lab work: Today: CBC and Magnesium level  If you have labs (blood work) drawn today and your tests are completely normal, you will receive your results only by: Marland Kitchen MyChart Message (if you have MyChart) OR . A paper copy in the mail If you have any lab test that is abnormal or we need to change your treatment, we will call you to review the results.  Testing/Procedures: None ordered  Follow-Up: Remote monitoring is used to monitor your Pacemaker  from home. This monitoring reduces the number of office visits required to check your device to one time per year. It allows [place] to keep an eye on the functioning of your device to ensure it is working properly. You are scheduled for a device check from home on 08/05/2018. You may send your transmission at any time that day. If you have a wireless device, the transmission will be sent automatically. After your physician reviews your transmission, you will receive a postcard with your next transmission date.  At [facility], you and your health needs are our priority.  As part of our continuing mission to provide you with exceptional heart care, we have created designated Provider Care Teams.  These Care Teams include your primary Cardiologist (physician) and Advanced Practice Providers (APPs -  Physician Assistants and Nurse Practitioners) who all work together to provide you with the care you need, when you need it. You will need a follow up appointment in 6 months.  Please call our office 2 months in advance to schedule this appointment.  You may see Dr. Graciela Husbands or one of the following Advanced Practice Providers on your designated Care Team:   Gypsy Balsam, NP . Francis Dowse, PA-C  Thank you for choosing [facility]!!

## 2018-05-27 ENCOUNTER — Ambulatory Visit
Admission: RE | Admit: 2018-05-27 | Discharge: 2018-05-27 | Disposition: A | Discharge status: Home / Self Care | Payer: Medicare Other | Source: Ambulatory Visit | Attending: Family Medicine | Admitting: Family Medicine

## 2018-05-27 DIAGNOSIS — Z1231 Encounter for screening mammogram for malignant neoplasm of breast: Secondary | ICD-10-CM

## 2018-07-05 ENCOUNTER — Emergency Department (HOSPITAL_COMMUNITY): Payer: Medicare Other

## 2018-07-05 ENCOUNTER — Observation Stay (HOSPITAL_COMMUNITY)
Admission: EM | Admit: 2018-07-05 | Discharge: 2018-07-07 | Disposition: A | Discharge status: Home / Self Care | Payer: Medicare Other | Attending: Internal Medicine | Admitting: Internal Medicine

## 2018-07-05 ENCOUNTER — Encounter (HOSPITAL_COMMUNITY): Payer: Self-pay | Admitting: Emergency Medicine

## 2018-07-05 ENCOUNTER — Other Ambulatory Visit: Payer: Self-pay

## 2018-07-05 DIAGNOSIS — G459 Transient cerebral ischemic attack, unspecified: Principal | ICD-10-CM | POA: Insufficient documentation

## 2018-07-05 DIAGNOSIS — J45909 Unspecified asthma, uncomplicated: Secondary | ICD-10-CM | POA: Insufficient documentation

## 2018-07-05 DIAGNOSIS — Z9981 Dependence on supplemental oxygen: Secondary | ICD-10-CM | POA: Diagnosis not present

## 2018-07-05 DIAGNOSIS — E0590 Thyrotoxicosis, unspecified without thyrotoxic crisis or storm: Secondary | ICD-10-CM | POA: Diagnosis not present

## 2018-07-05 DIAGNOSIS — F419 Anxiety disorder, unspecified: Secondary | ICD-10-CM | POA: Insufficient documentation

## 2018-07-05 DIAGNOSIS — Z950 Presence of cardiac pacemaker: Secondary | ICD-10-CM | POA: Insufficient documentation

## 2018-07-05 DIAGNOSIS — I480 Paroxysmal atrial fibrillation: Secondary | ICD-10-CM | POA: Insufficient documentation

## 2018-07-05 DIAGNOSIS — M19042 Primary osteoarthritis, left hand: Secondary | ICD-10-CM | POA: Insufficient documentation

## 2018-07-05 DIAGNOSIS — I4819 Other persistent atrial fibrillation: Secondary | ICD-10-CM | POA: Diagnosis present

## 2018-07-05 DIAGNOSIS — M109 Gout, unspecified: Secondary | ICD-10-CM | POA: Insufficient documentation

## 2018-07-05 DIAGNOSIS — R509 Fever, unspecified: Secondary | ICD-10-CM | POA: Diagnosis not present

## 2018-07-05 DIAGNOSIS — Z96651 Presence of right artificial knee joint: Secondary | ICD-10-CM | POA: Insufficient documentation

## 2018-07-05 DIAGNOSIS — I071 Rheumatic tricuspid insufficiency: Secondary | ICD-10-CM | POA: Diagnosis not present

## 2018-07-05 DIAGNOSIS — G8929 Other chronic pain: Secondary | ICD-10-CM | POA: Diagnosis not present

## 2018-07-05 DIAGNOSIS — I10 Essential (primary) hypertension: Secondary | ICD-10-CM | POA: Diagnosis not present

## 2018-07-05 DIAGNOSIS — M19041 Primary osteoarthritis, right hand: Secondary | ICD-10-CM | POA: Diagnosis not present

## 2018-07-05 DIAGNOSIS — R197 Diarrhea, unspecified: Secondary | ICD-10-CM | POA: Diagnosis not present

## 2018-07-05 DIAGNOSIS — Z79899 Other long term (current) drug therapy: Secondary | ICD-10-CM | POA: Insufficient documentation

## 2018-07-05 DIAGNOSIS — M545 Low back pain: Secondary | ICD-10-CM | POA: Insufficient documentation

## 2018-07-05 DIAGNOSIS — M170 Bilateral primary osteoarthritis of knee: Secondary | ICD-10-CM | POA: Insufficient documentation

## 2018-07-05 DIAGNOSIS — Z7901 Long term (current) use of anticoagulants: Secondary | ICD-10-CM | POA: Diagnosis not present

## 2018-07-05 LAB — I-STAT CHEM 8, ED
BUN: 9 mg/dL (ref 8–23)
CALCIUM ION: 1.02 mmol/L — AB (ref 1.15–1.40)
Chloride: 101 mmol/L (ref 98–111)
Creatinine, Ser: 0.5 mg/dL (ref 0.44–1.00)
Glucose, Bld: 136 mg/dL — ABNORMAL HIGH (ref 70–99)
HCT: 37 % (ref 36.0–46.0)
Hemoglobin: 12.6 g/dL (ref 12.0–15.0)
Potassium: 3.5 mmol/L (ref 3.5–5.1)
Sodium: 132 mmol/L — ABNORMAL LOW (ref 135–145)
TCO2: 22 mmol/L (ref 22–32)

## 2018-07-05 LAB — COMPREHENSIVE METABOLIC PANEL
ALT: 14 U/L (ref 0–44)
AST: 15 U/L (ref 15–41)
Albumin: 2.9 g/dL — ABNORMAL LOW (ref 3.5–5.0)
Alkaline Phosphatase: 65 U/L (ref 38–126)
Anion gap: 9 (ref 5–15)
BUN: 8 mg/dL (ref 8–23)
CO2: 22 mmol/L (ref 22–32)
Calcium: 8.7 mg/dL — ABNORMAL LOW (ref 8.9–10.3)
Chloride: 101 mmol/L (ref 98–111)
Creatinine, Ser: 0.61 mg/dL (ref 0.44–1.00)
GFR calc Af Amer: >60 mL/min (ref >60)
GFR calc non Af Amer: >60 mL/min (ref >60)
Glucose, Bld: 138 mg/dL — ABNORMAL HIGH (ref 70–99)
Potassium: 3.6 mmol/L (ref 3.5–5.1)
Sodium: 132 mmol/L — ABNORMAL LOW (ref 135–145)
Total Bilirubin: 0.4 mg/dL (ref 0.3–1.2)
Total Protein: 6.6 g/dL (ref 6.5–8.1)

## 2018-07-05 LAB — CBC WITH DIFFERENTIAL/PLATELET
Abs Immature Granulocytes: 0.01 10*3/uL (ref 0.00–0.07)
Basophils Absolute: 0 10*3/uL (ref 0.0–0.1)
Basophils Relative: 1 %
Eosinophils Absolute: 0.1 10*3/uL (ref 0.0–0.5)
Eosinophils Relative: 1 %
HCT: 37.4 % (ref 36.0–46.0)
Hemoglobin: 12.2 g/dL (ref 12.0–15.0)
Immature Granulocytes: 0 %
Lymphocytes Relative: 50 %
Lymphs Abs: 2.1 10*3/uL (ref 0.7–4.0)
MCH: 28.6 pg (ref 26.0–34.0)
MCHC: 32.6 g/dL (ref 30.0–36.0)
MCV: 87.6 fL (ref 80.0–100.0)
Monocytes Absolute: 0.5 10*3/uL (ref 0.1–1.0)
Monocytes Relative: 12 %
Neutro Abs: 1.5 10*3/uL — ABNORMAL LOW (ref 1.7–7.7)
Neutrophils Relative %: 36 %
Platelets: 220 10*3/uL (ref 150–400)
RBC: 4.27 MIL/uL (ref 3.87–5.11)
RDW: 13.1 % (ref 11.5–15.5)
WBC: 4.2 10*3/uL (ref 4.0–10.5)
nRBC: 0 % (ref 0.0–0.2)

## 2018-07-05 LAB — I-STAT TROPONIN, ED: Troponin i, poc: 0 ng/mL (ref 0.00–0.08)

## 2018-07-05 LAB — I-STAT CG4 LACTIC ACID, ED: Lactic Acid, Venous: 1.22 mmol/L (ref 0.5–1.9)

## 2018-07-05 MED ORDER — SODIUM CHLORIDE 0.9 % IV BOLUS
500.0000 mL | Freq: Once | INTRAVENOUS | Status: AC
  Administered 2018-07-05: 500 mL via INTRAVENOUS

## 2018-07-05 NOTE — ED Provider Notes (Signed)
[facility] EMERGENCY DEPARTMENT Provider Note   CSN: [id number] Arrival date & time: 07/05/18  2130     History   Chief Complaint Chief Complaint  Patient presents with  . Fever    HPI Crystal Hess is a 84 y.o. female.  The history is provided by the patient, medical records and the EMS personnel. No language interpreter was used.  Fever     Crystal Hess is a 84 y.o. female who presents to the Emergency Department complaining of confusion. She presents to the emergency department complaining of weakness and confusion. Over the last 2 to 3 weeks she is experienced progressive generalized weakness. She was recently treated for gout in her left foot. She said gout is currently resolved. Today her friend was visiting and she had about 30 minutes of speech difficulties where her words were not coming out correctly. There is concern that she has been taking her medications inappropriately. She is a resident at independent living facility. She has a history of atrial fibrillation. She denies any chest pain, shortness of breath, abdominal pain. She does have nausea. He currently feels unwell. EMS report fever to 100.6, no reports of fevers at home. Overall her speech feels better. Past Medical History:  Diagnosis Date  . Anxiety   . Arthritis    "knees, hands" (11/08/2014)  . Asthma   . Chronic lower back pain   . DJD (degenerative joint disease)   . DOE (dyspnea on exertion)    wears O2 at night but has not had a sleep study  . Dysrhythmia   . Family history of adverse reaction to anesthesia    "sister's heart stopped and had to be revived; think it was related to the demoral"  . Gout    "I've had it twice already this year" (11/08/2014)  . Hypertension   . Hyperthyroidism   . Mitral regurgitation    mild  . Obesity   . On home oxygen therapy    "2L just at night" (11/08/2014)  . Persistent atrial fibrillation   . Trace tricuspid regurgitation by prior  echocardiogram   . Walking pneumonia ~ 1998    Patient Active Problem List   Diagnosis Date Noted  . Fever 07/06/2018  . TIA (transient ischemic attack) 07/06/2018  . Hyperthyroidism 07/06/2018  . Gout 07/06/2018  . S/P total knee arthroplasty 02/06/2015  . Visit for monitoring Tikosyn therapy 11/08/2014  . Persistent atrial fibrillation 10/10/2014  . Snoring 10/10/2014  . Morbid obesity (HCC) 10/10/2014  . Essential hypertension 10/10/2014  . Acute bronchitis 08/10/2012  . Syncope 12/07/2010  . ALLERGIC RHINITIS 05/04/2008  . 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 CARDIOVERSION N/A 07/26/2014   Procedure: CARDIOVERSION;  Surgeon: Pamella Pert, MD;  Location: [facility] ENDOSCOPY;  Service: Cardiovascular;  Laterality: N/A;  . CARDIOVERSION N/A 08/30/2014   Procedure: CARDIOVERSION;  Surgeon: Pamella Pert, MD;  Location: [facility] ENDOSCOPY;  Service: Cardiovascular;  Laterality: N/A;  . CATARACT EXTRACTION W/ INTRAOCULAR LENS  IMPLANT, BILATERAL Bilateral ~ 2006  . EXCISIONAL HEMORRHOIDECTOMY  ~ 1960  . EYE SURGERY    . KNEE CARTILAGE SURGERY Right 1976   "cut it open"  . OVARIAN CYST REMOVAL Right 1963   "gangrene"  . PACEMAKER IMPLANT  05/11/2017   Medtronic MRI SureScan  . SHOULDER ARTHROSCOPY Left ~ 2008   bone spur  . TONSILLECTOMY    . TOTAL KNEE ARTHROPLASTY Right 02/06/2015  Procedure: RIGHT TOTAL KNEE ARTHROPLASTY;  Surgeon: Dannielle HuhSteve Lucey, MD;  Location: [facility] OR;  Service: Orthopedics;  Laterality: Right;     OB History   No obstetric history on file.      Home Medications    Prior to Admission medications   Medication Sig Start Date End Date Taking? Authorizing Provider  allopurinol (ZYLOPRIM) 300 MG tablet Take 300 mg by mouth daily.  01/10/15  Yes [provider]  colchicine 0.6 MG tablet Take 1.2 mg by mouth daily as needed (for gout).   Yes [provider]  dofetilide (TIKOSYN) 250 MCG  capsule Take 1 capsule by mouth 2 (two) times daily. 04/29/18  Yes [provider]  ELIQUIS 5 MG TABS tablet Take 5 mg by mouth 2 (two) times daily. 08/12/14  Yes [provider]  furosemide (LASIX) 20 MG tablet Take 1 tablet by mouth every other day. 04/20/18  Yes [provider]  gabapentin (NEURONTIN) 100 MG capsule Take 100-200 mg by mouth at bedtime.   Yes [provider]  irbesartan (AVAPRO) 300 MG tablet Take 300 mg by mouth daily.   Yes [provider]  leflunomide (ARAVA) 20 MG tablet Take 20 mg by mouth daily.   Yes [provider]  loteprednol (LOTEMAX) 0.5 % ophthalmic suspension Place 1 drop into both eyes 2 (two) times daily.   Yes [provider]  MAGNESIUM-OXIDE 400 (241.3 Mg) MG tablet TAKE 1 TABLET(400 MG) BY MOUTH DAILY Patient taking differently: Take 400 mg by mouth daily.  02/24/17  Yes Duke SalviaKlein, Steven C, MD  methimazole (TAPAZOLE) 5 MG tablet Take 5 mg by mouth at bedtime.  06/13/14  Yes [provider]  metoprolol tartrate (LOPRESSOR) 25 MG tablet Take 12.5 mg by mouth daily.   Yes [provider]  potassium chloride SA (K-DUR,KLOR-CON) 20 MEQ tablet Take 20 mEq by mouth every other day.  04/20/18  Yes [provider]  PROVENTIL HFA 108 (90 BASE) MCG/ACT inhaler INHALE 2 PUFFS INTO LUNGS EVERY 6 HOURS AS NEEDED FOR WHEEZING AND SHORTNESS OF BREATH Patient taking differently: Inhale 2 puffs into the lungs every 6 (six) hours as needed for wheezing.  11/05/12  Yes Young, Joni Fearslinton D, MD  traMADol (ULTRAM) 50 MG tablet Take 50 mg by mouth every 8 (eight) hours as needed for moderate pain.   Yes [provider]    Family History Family History  Problem Relation Age of Onset  . Lymphoma Mother   . Lung cancer Father        smoker  . Emphysema Brother     Social History Social History   Tobacco Use  . Smoking status: Never Smoker  . Smokeless tobacco: Never Used  Substance Use  Topics  . Alcohol use: No  . Drug use: No     Allergies   Meperidine and related; Other; Codeine; Methotrexate derivatives; and Vasculera [nutritional supplements]   Review of Systems Review of Systems  Constitutional: Positive for fever.  All other systems reviewed and are negative.    Physical Exam Updated Vital Signs BP 131/79   Pulse (!) 108   Temp 100.1 F (37.8 C) (Rectal)   Resp (!) 21   SpO2 100%   Physical Exam Vitals signs and nursing note reviewed.  Constitutional:      Appearance: She is well-developed.  HENT:     Head: Normocephalic and atraumatic.  Neck:     Musculoskeletal: Neck supple.  Cardiovascular:     Rate and  Rhythm: Normal rate and regular rhythm.     Heart sounds: No murmur.  Pulmonary:     Effort: Pulmonary effort is normal. No respiratory distress.     Breath sounds: Normal breath sounds.  Abdominal:     Palpations: Abdomen is soft.     Tenderness: There is no abdominal tenderness. There is no guarding or rebound.  Musculoskeletal:        General: No swelling or tenderness.  Skin:    General: Skin is warm and dry.  Neurological:     Mental Status: She is alert and oriented to person, place, and time.     Comments: Five out of five strength in all four extremities with sensation to light touch intact in all four extremities. No pronator drift.  Psychiatric:        Mood and Affect: Mood normal.        Behavior: Behavior normal.      ED Treatments / Results  Labs (all labs ordered are listed, but only abnormal results are displayed) Labs Reviewed  COMPREHENSIVE METABOLIC PANEL - Abnormal; Notable for the following components:      Result Value   Sodium 132 (*)    Glucose, Bld 138 (*)    Calcium 8.7 (*)    Albumin 2.9 (*)    All other components within normal limits  CBC WITH DIFFERENTIAL/PLATELET - Abnormal; Notable for the following components:   Neutro Abs 1.5 (*)    All other components within normal limits  URINALYSIS,  ROUTINE W REFLEX MICROSCOPIC - Abnormal; Notable for the following components:   Color, Urine STRAW (*)    Specific Gravity, Urine 1.003 (*)    Hgb urine dipstick SMALL (*)    Bacteria, UA RARE (*)    All other components within normal limits  I-STAT CHEM 8, ED - Abnormal; Notable for the following components:   Sodium 132 (*)    Glucose, Bld 136 (*)    Calcium, Ion 1.02 (*)    All other components within normal limits  CULTURE, BLOOD (ROUTINE X 2)  CULTURE, BLOOD (ROUTINE X 2)  URINE CULTURE  INFLUENZA PANEL BY PCR (TYPE A & B)  TSH  HEMOGLOBIN A1C  LIPID PANEL  I-STAT CG4 LACTIC ACID, ED  I-STAT TROPONIN, ED  I-STAT CG4 LACTIC ACID, ED    EKG EKG Interpretation  Date/Time:  Sunday July 05 2018 21:31:45 EST Ventricular Rate:  75 PR Interval:    QRS Duration: 84 QT Interval:  405 QTC Calculation: 453 R Axis:   37 Text Interpretation:  Atrial fibrillation Low voltage, precordial leads Baseline wander in lead(s) III V3 V4 V5 V6 Confirmed by Tilden Fossa [phone]) on 07/05/2018 9:38:59 PM   Radiology Ct Head Wo Contrast  Result Date: 07/05/2018 CLINICAL DATA:  Altered mental status EXAM: CT HEAD WITHOUT CONTRAST TECHNIQUE: Contiguous axial images were obtained from the base of the skull through the vertex without intravenous contrast. COMPARISON:  01/01/2007 CT FINDINGS: BRAIN: There is age related sulcal and ventricular prominence consistent with mild superficial and central atrophy. No intraparenchymal hemorrhage, mass effect nor midline shift. Periventricular and subcortical white matter hypodensities consistent with chronic mild small vessel ischemic disease are identified. No acute large vascular territory infarcts. No abnormal extra-axial fluid collections. Basal cisterns are not effaced and midline brainstem and cerebellum are nonacute. VASCULAR: Mild-to-moderate atherosclerosis of the carotid siphons. No hyperdense vessel or unexpected calcifications. SKULL: No skull  fracture. No significant scalp soft tissue swelling. SINUSES/ORBITS: The mastoid air-cells  are clear. The included paranasal sinuses are well-aerated.Bilateral lens replacements. OTHER: None. IMPRESSION: Atrophy with chronic small vessel ischemic disease. No acute intracranial abnormality. Electronically Signed   By: Tollie Ethavid  Kwon M.D.   On: 07/05/2018 22:17   Dg Chest Port 1 View  Result Date: 07/05/2018 CLINICAL DATA:  Fever and nonproductive cough for 3 days. EXAM: PORTABLE CHEST 1 VIEW COMPARISON:  04/19/2014 FINDINGS: Top-normal heart size with moderate aortic atherosclerosis. No aneurysm is identified. Atelectasis is seen at the left lung base. No pulmonary consolidation, pulmonary edema, effusion or pneumothorax. Left-sided pacemaker apparatus with leads in the right atrium and right ventricle are noted. No acute osseous abnormality is seen. IMPRESSION: No active disease. Electronically Signed   By: Tollie Ethavid  Kwon M.D.   On: 07/05/2018 22:16    Procedures Procedures (including critical care time)  Medications Ordered in ED Medications   stroke: mapping our early stages of recovery book (has no administration in time range)  acetaminophen (TYLENOL) tablet 650 mg (has no administration in time range)    Or  acetaminophen (TYLENOL) solution 650 mg (has no administration in time range)    Or  acetaminophen (TYLENOL) suppository 650 mg (has no administration in time range)  gabapentin (NEURONTIN) capsule 100-200 mg (has no administration in time range)  leflunomide (ARAVA) tablet 20 mg (has no administration in time range)  loteprednol (LOTEMAX) 0.5 % ophthalmic suspension 1 drop (has no administration in time range)  traMADol (ULTRAM) tablet 50 mg (has no administration in time range)  albuterol (PROVENTIL) (2.5 MG/3ML) 0.083% nebulizer solution 3 mL (has no administration in time range)  metoprolol tartrate (LOPRESSOR) tablet 12.5 mg (has no administration in time range)  methimazole  (TAPAZOLE) tablet 5 mg (has no administration in time range)  magnesium oxide (MAG-OX) tablet 400 mg (has no administration in time range)  apixaban (ELIQUIS) tablet 5 mg (has no administration in time range)  dofetilide (TIKOSYN) capsule 250 mcg (has no administration in time range)  allopurinol (ZYLOPRIM) tablet 300 mg (has no administration in time range)  colchicine tablet 1.2 mg (has no administration in time range)  sodium chloride 0.9 % bolus 500 mL (0 mLs Intravenous Stopped 07/06/18 0002)     Initial Impression / Assessment and Plan / ED Course  I have reviewed the triage vital signs and the nursing notes.  Pertinent labs & imaging results that were available during my care of the patient were reviewed by me and considered in my medical decision making (see chart for details).     Patient presents for evaluation of altered mental status with expressive aphasia that lasted for about 30 minutes prior to ED arrival. She also is noted to be febrile prior to ED arrival, improved and department. She is non-toxic appearing on examination with no respiratory distress. There are no focal neurologic deficits. No evidence of pneumonia. UA is pending. Medicine consulted for admission for TIA evaluation given her transient expressive aphasia. Patient updated of findings of studies and recommendation for admission and she is in agreement with treatment plan.  Final Clinical Impressions(s) / ED Diagnoses   Final diagnoses:  None    ED Discharge Orders    None       Tilden Fossaees, Xue Low, MD 07/06/18 [phone]

## 2018-07-05 NOTE — ED Triage Notes (Signed)
Pt BIB [facility] from independent living facility, family noted pt has had a physical decline x 3 weeks. Unsure if pt has been taking home meds appropriately. Febrile today, received 1g tylenol PTA. Recent nausea/diarrhea illness. Today pt had an episode of aphasia that has since resolved.

## 2018-07-06 ENCOUNTER — Other Ambulatory Visit: Payer: Self-pay

## 2018-07-06 ENCOUNTER — Observation Stay (HOSPITAL_COMMUNITY): Payer: Medicare Other

## 2018-07-06 ENCOUNTER — Observation Stay (HOSPITAL_BASED_OUTPATIENT_CLINIC_OR_DEPARTMENT_OTHER): Payer: Medicare Other

## 2018-07-06 ENCOUNTER — Encounter (HOSPITAL_COMMUNITY): Payer: Self-pay | Admitting: Internal Medicine

## 2018-07-06 DIAGNOSIS — E0590 Thyrotoxicosis, unspecified without thyrotoxic crisis or storm: Secondary | ICD-10-CM | POA: Diagnosis not present

## 2018-07-06 DIAGNOSIS — R509 Fever, unspecified: Secondary | ICD-10-CM | POA: Diagnosis present

## 2018-07-06 DIAGNOSIS — G459 Transient cerebral ischemic attack, unspecified: Secondary | ICD-10-CM

## 2018-07-06 DIAGNOSIS — I4819 Other persistent atrial fibrillation: Secondary | ICD-10-CM | POA: Diagnosis not present

## 2018-07-06 DIAGNOSIS — I10 Essential (primary) hypertension: Secondary | ICD-10-CM

## 2018-07-06 DIAGNOSIS — M109 Gout, unspecified: Secondary | ICD-10-CM | POA: Diagnosis present

## 2018-07-06 LAB — LIPID PANEL
Cholesterol: 102 mg/dL (ref 0–200)
HDL: 35 mg/dL — ABNORMAL LOW (ref >40)
LDL Cholesterol: 53 mg/dL (ref 0–99)
Total CHOL/HDL Ratio: 2.9 RATIO
Triglycerides: 68 mg/dL (ref <150)
VLDL: 14 mg/dL (ref 0–40)

## 2018-07-06 LAB — URINALYSIS, ROUTINE W REFLEX MICROSCOPIC
Bilirubin Urine: NEGATIVE
Glucose, UA: NEGATIVE mg/dL
Ketones, ur: NEGATIVE mg/dL
Leukocytes, UA: NEGATIVE
Nitrite: NEGATIVE
Protein, ur: NEGATIVE mg/dL
Specific Gravity, Urine: 1.003 — ABNORMAL LOW (ref 1.005–1.030)
pH: 6 (ref 5.0–8.0)

## 2018-07-06 LAB — ECHOCARDIOGRAM COMPLETE
Height: 63.5 in
Weight: 2640 oz

## 2018-07-06 LAB — I-STAT CG4 LACTIC ACID, ED: Lactic Acid, Venous: 1.34 mmol/L (ref 0.5–1.9)

## 2018-07-06 LAB — INFLUENZA PANEL BY PCR (TYPE A & B)
Influenza A By PCR: NEGATIVE
Influenza B By PCR: NEGATIVE

## 2018-07-06 LAB — HEMOGLOBIN A1C
Hgb A1c MFr Bld: 5.9 % — ABNORMAL HIGH (ref 4.8–5.6)
Mean Plasma Glucose: 122.63 mg/dL

## 2018-07-06 LAB — TSH: TSH: 0.591 u[IU]/mL (ref 0.350–4.500)

## 2018-07-06 MED ORDER — ZOLPIDEM TARTRATE 5 MG PO TABS
5.0000 mg | ORAL_TABLET | Freq: Once | ORAL | Status: AC
  Administered 2018-07-07: 5 mg via ORAL
  Filled 2018-07-06: qty 1

## 2018-07-06 MED ORDER — DOFETILIDE 250 MCG PO CAPS
250.0000 ug | ORAL_CAPSULE | Freq: Two times a day (BID) | ORAL | Status: DC
  Administered 2018-07-06 – 2018-07-07 (×3): 250 ug via ORAL
  Filled 2018-07-06 (×3): qty 1

## 2018-07-06 MED ORDER — POTASSIUM CHLORIDE CRYS ER 20 MEQ PO TBCR
40.0000 meq | EXTENDED_RELEASE_TABLET | Freq: Once | ORAL | Status: AC
  Administered 2018-07-06: 40 meq via ORAL
  Filled 2018-07-06: qty 2

## 2018-07-06 MED ORDER — LOTEPREDNOL ETABONATE 0.5 % OP SUSP: 1.0000 [drp] | Freq: Two times a day (BID) | OPHTHALMIC | Status: DC

## 2018-07-06 MED ORDER — TRAMADOL HCL 50 MG PO TABS: 50.0000 mg | ORAL_TABLET | Freq: Three times a day (TID) | ORAL | Status: DC | PRN

## 2018-07-06 MED ORDER — GABAPENTIN 100 MG PO CAPS
100.0000 mg | ORAL_CAPSULE | Freq: Every day | ORAL | Status: DC
  Administered 2018-07-06: 100 mg via ORAL
  Filled 2018-07-06: qty 1

## 2018-07-06 MED ORDER — ALBUTEROL SULFATE (2.5 MG/3ML) 0.083% IN NEBU: 3.0000 mL | INHALATION_SOLUTION | Freq: Four times a day (QID) | RESPIRATORY_TRACT | Status: DC | PRN

## 2018-07-06 MED ORDER — ACETAMINOPHEN 650 MG RE SUPP: 650.0000 mg | RECTAL | Status: DC | PRN

## 2018-07-06 MED ORDER — METHIMAZOLE 5 MG PO TABS
5.0000 mg | ORAL_TABLET | Freq: Every day | ORAL | Status: DC
  Filled 2018-07-06: qty 1

## 2018-07-06 MED ORDER — COLCHICINE 0.6 MG PO TABS: 1.2000 mg | ORAL_TABLET | Freq: Every day | ORAL | Status: DC | PRN

## 2018-07-06 MED ORDER — STROKE: EARLY STAGES OF RECOVERY BOOK: Freq: Once | Status: DC

## 2018-07-06 MED ORDER — LEFLUNOMIDE 20 MG PO TABS
20.0000 mg | ORAL_TABLET | Freq: Every day | ORAL | Status: DC
  Administered 2018-07-06 – 2018-07-07 (×2): 20 mg via ORAL
  Filled 2018-07-06 (×2): qty 1

## 2018-07-06 MED ORDER — ALLOPURINOL 300 MG PO TABS
300.0000 mg | ORAL_TABLET | Freq: Every day | ORAL | Status: DC
  Administered 2018-07-06 – 2018-07-07 (×2): 300 mg via ORAL
  Filled 2018-07-06 (×2): qty 1

## 2018-07-06 MED ORDER — MAGNESIUM OXIDE 400 (241.3 MG) MG PO TABS
400.0000 mg | ORAL_TABLET | Freq: Every day | ORAL | Status: DC
  Administered 2018-07-06 – 2018-07-07 (×2): 400 mg via ORAL
  Filled 2018-07-06 (×2): qty 1

## 2018-07-06 MED ORDER — ACETAMINOPHEN 160 MG/5ML PO SOLN: 650.0000 mg | ORAL | Status: DC | PRN

## 2018-07-06 MED ORDER — LORATADINE 10 MG PO TABS
10.0000 mg | ORAL_TABLET | Freq: Every day | ORAL | Status: DC
  Administered 2018-07-06 – 2018-07-07 (×2): 10 mg via ORAL
  Filled 2018-07-06 (×2): qty 1

## 2018-07-06 MED ORDER — APIXABAN 5 MG PO TABS
5.0000 mg | ORAL_TABLET | Freq: Two times a day (BID) | ORAL | Status: DC
  Administered 2018-07-06 – 2018-07-07 (×3): 5 mg via ORAL
  Filled 2018-07-06 (×3): qty 1

## 2018-07-06 MED ORDER — ACETAMINOPHEN 325 MG PO TABS: 650.0000 mg | ORAL_TABLET | ORAL | Status: DC | PRN

## 2018-07-06 MED ORDER — METOPROLOL TARTRATE 25 MG PO TABS
12.5000 mg | ORAL_TABLET | Freq: Every day | ORAL | Status: DC
  Administered 2018-07-06 – 2018-07-07 (×2): 12.5 mg via ORAL
  Filled 2018-07-06 (×2): qty 1

## 2018-07-06 MED ORDER — ZINC OXIDE 12.8 % EX OINT
TOPICAL_OINTMENT | CUTANEOUS | Status: DC | PRN
  Administered 2018-07-06: 23:00:00 via TOPICAL
  Filled 2018-07-06: qty 56.7

## 2018-07-06 NOTE — Progress Notes (Signed)
  Echocardiogram 2D Echocardiogram has been performed.  Crystal PartridgeBrooke S Hermenia Fritcher 07/06/2018, 2:31 PM

## 2018-07-06 NOTE — ED Notes (Signed)
Ordered breakfast diet heart healthy  

## 2018-07-06 NOTE — ED Notes (Signed)
Report to ruth, pt changed and cleaned of small amt yellow stool new adult diaper placed, pt [id number] brother is family member with her

## 2018-07-06 NOTE — Progress Notes (Signed)
Post admission follow-up note.  Patient admitted for work-up of TIA versus delirium.  She reports that she has had episodes of diarrhea had improved by Friday but has returned today.  He is also complaining of a gout flare.  Potassium is low at 3.5 and potassium has been repleted orally.  I am going to send a stool pathogen panel.  Her allopurinol and colchicine are ordered.

## 2018-07-06 NOTE — Progress Notes (Signed)
Carotid duplex has been completed.   Preliminary results in CV Proc.   Blanch Crystal Hess 07/06/2018 2:07 PM

## 2018-07-06 NOTE — H&P (Signed)
History and Physical    Crystal Hess ZOX:[id number] DOB: [birth year] DOA: 07/05/2018  PCP: Barbie BannerWilson, Fred H, MD  Patient coming from: Home  I have personally briefly reviewed patient's [facility]  Chief Complaint: Fever, Confusion  HPI: Crystal RipperFrances I Mullenbach is a 84 y.o. female with medical history significant of A.Fib on eliquis, hyperthyroidism, HTN, gout, PPM placement last year.  Patient presents to the ED with c/o weakness and confusion.  Over past 2-3 weeks, progressive generalized weakness, URI symptoms.  Recently treated for gout in L foot, gout is resolved.  Today her friend was visiting and she had about 30 minutes of speech difficulties where her words were not coming out correctly. There is concern that she has been taking her medications inappropriately. She is a resident at independent living facility.  No CP, SOB, abd pain, does have nausea.   ED Course: Tm 100.6 with EMS.  Speech improved back to baseline.   Review of Systems: As per HPI otherwise 10 point review of systems negative.   Past Medical History:  Diagnosis Date  . Anxiety   . Arthritis    "knees, hands" (11/08/2014)  . Asthma   . Chronic lower back pain   . DJD (degenerative joint disease)   . DOE (dyspnea on exertion)    wears O2 at night but has not had a sleep study  . Dysrhythmia   . Family history of adverse reaction to anesthesia    "sister's heart stopped and had to be revived; think it was related to the demoral"  . Gout    "I've had it twice already this year" (11/08/2014)  . Hypertension   . Hyperthyroidism   . Mitral regurgitation    mild  . Obesity   . On home oxygen therapy    "2L just at night" (11/08/2014)  . Persistent atrial fibrillation   . Trace tricuspid regurgitation by prior echocardiogram   . Walking pneumonia ~ 1998    Past Surgical History:  Procedure Laterality Date  . CARDIOVERSION N/A 07/26/2014   Procedure: CARDIOVERSION;  Surgeon:  Pamella PertJagadeesh R Ganji, MD;  [facility];  Service: Cardiovascular;  Laterality: N/A;  . CARDIOVERSION N/A 08/30/2014   Procedure: CARDIOVERSION;  Surgeon: Pamella PertJagadeesh R Ganji, MD;  [facility];  Service: Cardiovascular;  Laterality: N/A;  . CATARACT EXTRACTION W/ INTRAOCULAR LENS  IMPLANT, BILATERAL Bilateral ~ 2006  . EXCISIONAL HEMORRHOIDECTOMY  ~ 1960  . EYE SURGERY    . KNEE CARTILAGE SURGERY Right 1976   "cut it open"  . OVARIAN CYST REMOVAL Right 1963   "gangrene"  . PACEMAKER IMPLANT  05/11/2017   Medtronic MRI SureScan  . SHOULDER ARTHROSCOPY Left ~ 2008   bone spur  . TONSILLECTOMY    . TOTAL KNEE ARTHROPLASTY Right 02/06/2015   Procedure: RIGHT TOTAL KNEE ARTHROPLASTY;  Surgeon: Dannielle HuhSteve Lucey, MD;  Location: [facility] OR;  Service: Orthopedics;  Laterality: Right;     reports that she has never smoked. She has never used smokeless tobacco. She reports that she does not drink alcohol or use drugs.  Allergies  Allergen Reactions  . Meperidine And Related Other (See Comments)    "have hallucinations; get combative; I don't want that"  . Other Hives    Polyester- severe   . Codeine Nausea Only  . Methotrexate Derivatives Other (See Comments)    Restless  . Vasculera [Nutritional Supplements] Other (See Comments)    Unknown    Family History  Problem Relation Age of Onset  . Lymphoma Mother   . Lung cancer Father        smoker  . Emphysema Brother      Prior to Admission medications   Medication Sig Start Date End Date Taking? Authorizing Provider  allopurinol (ZYLOPRIM) 300 MG tablet Take 300 mg by mouth daily.  01/10/15  Yes [provider]  colchicine 0.6 MG tablet Take 1.2 mg by mouth daily as needed (for gout).   Yes [provider]  dofetilide (TIKOSYN) 250 MCG capsule Take 1 capsule by mouth 2 (two) times daily. 04/29/18  Yes [provider]  ELIQUIS 5 MG TABS tablet Take 5 mg by mouth 2 (two) times daily. 08/12/14  Yes [provider]  furosemide (LASIX) 20 MG tablet Take 1 tablet by mouth every other day. 04/20/18  Yes [provider]  gabapentin (NEURONTIN) 100 MG capsule Take 100-200 mg by mouth at bedtime.   Yes [provider]  irbesartan (AVAPRO) 300 MG tablet Take 300 mg by mouth daily.   Yes [provider]  leflunomide (ARAVA) 20 MG tablet Take 20 mg by mouth daily.   Yes [provider]  loteprednol (LOTEMAX) 0.5 % ophthalmic suspension Place 1 drop into both eyes 2 (two) times daily.   Yes [provider]  MAGNESIUM-OXIDE 400 (241.3 Mg) MG tablet TAKE 1 TABLET(400 MG) BY MOUTH DAILY Patient taking differently: Take 400 mg by mouth daily.  02/24/17  Yes Duke Salvia, MD  methimazole (TAPAZOLE) 5 MG tablet Take 5 mg by mouth at bedtime.  06/13/14  Yes [provider]  metoprolol tartrate (LOPRESSOR) 25 MG tablet Take 12.5 mg by mouth daily.   Yes [provider]  potassium chloride SA (K-DUR,KLOR-CON) 20 MEQ tablet Take 20 mEq by mouth every other day.  04/20/18  Yes [provider]  PROVENTIL HFA 108 (90 BASE) MCG/ACT inhaler INHALE 2 PUFFS INTO LUNGS EVERY 6 HOURS AS NEEDED FOR WHEEZING AND SHORTNESS OF BREATH Patient taking differently: Inhale 2 puffs into the lungs every 6 (six) hours as needed for wheezing.  11/05/12  Yes Young, Joni Fears D, MD  traMADol (ULTRAM) 50 MG tablet Take 50 mg by mouth every 8 (eight) hours as needed for moderate pain.   Yes [provider]    Physical Exam: Vitals:   07/05/18 2215 07/05/18 2300 07/05/18 2345 07/06/18 0000  BP: 100/88 138/63 (!) 160/79 131/79  Pulse: 71 (!) 38 69 (!) 108  Resp: 20 (!) 21    Temp:      TempSrc:      SpO2: 97% 96% 98% 100%    Constitutional: NAD, calm, comfortable Eyes: PERRL, lids and conjunctivae normal ENMT: Mucous membranes are moist. Posterior pharynx clear of any exudate or lesions.Normal dentition.  Neck: normal, supple, no masses, no  thyromegaly Respiratory: clear to auscultation bilaterally, no wheezing, no crackles. Normal respiratory effort. No accessory muscle use.  Cardiovascular: Regular rate and rhythm, no murmurs / rubs / gallops. No extremity edema. 2+ pedal pulses. No carotid bruits.  Abdomen: no tenderness, no masses palpated. No hepatosplenomegaly. Bowel sounds positive.  Musculoskeletal: no clubbing / cyanosis. No joint deformity upper and lower extremities. Good ROM, no contractures. Normal muscle tone.  Skin: no rashes, lesions, ulcers. No induration Neurologic: CN 2-12 grossly intact. Sensation intact, DTR normal. Strength 5/5 in all 4.  Psychiatric: Normal judgment and insight. Alert and oriented x 3. Normal mood.    Labs on Admission: I have  personally reviewed following labs and imaging studies  CBC: Recent Labs  Lab 07/05/18 2235 07/05/18 2242  WBC 4.2  --   NEUTROABS 1.5*  --   HGB 12.2 12.6  HCT 37.4 37.0  MCV 87.6  --   PLT 220  --    Basic Metabolic Panel: Recent Labs  Lab 07/05/18 2235 07/05/18 2242  NA 132* 132*  K 3.6 3.5  CL 101 101  CO2 22  --   GLUCOSE 138* 136*  BUN 8 9  CREATININE 0.61 0.50  CALCIUM 8.7*  --    GFR: CrCl cannot be calculated (Unknown ideal weight.). Liver Function Tests: Recent Labs  Lab 07/05/18 2235  AST 15  ALT 14  ALKPHOS 65  BILITOT 0.4  PROT 6.6  ALBUMIN 2.9*   No results for input(s): LIPASE, AMYLASE in the last 168 hours. No results for input(s): AMMONIA in the last 168 hours. Coagulation Profile: No results for input(s): INR, PROTIME in the last 168 hours. Cardiac Enzymes: No results for input(s): CKTOTAL, CKMB, CKMBINDEX, TROPONINI in the last 168 hours. BNP (last 3 results) No results for input(s): PROBNP in the last 8760 hours. HbA1C: No results for input(s): HGBA1C in the last 72 hours. CBG: No results for input(s): GLUCAP in the last 168 hours. Lipid Profile: No results for input(s): CHOL, HDL, LDLCALC, TRIG, CHOLHDL,  LDLDIRECT in the last 72 hours. Thyroid Function Tests: No results for input(s): TSH, T4TOTAL, FREET4, T3FREE, THYROIDAB in the last 72 hours. Anemia Panel: No results for input(s): VITAMINB12, FOLATE, FERRITIN, TIBC, IRON, RETICCTPCT in the last 72 hours. Urine analysis:    Component Value Date/Time   COLORURINE YELLOW 01/30/2015 1546   APPEARANCEUR CLEAR 01/30/2015 1546   LABSPEC 1.024 01/30/2015 1546   PHURINE 6.0 01/30/2015 1546   GLUCOSEU NEGATIVE 01/30/2015 1546   HGBUR NEGATIVE 01/30/2015 1546   BILIRUBINUR NEGATIVE 01/30/2015 1546   KETONESUR NEGATIVE 01/30/2015 1546   PROTEINUR NEGATIVE 01/30/2015 1546   UROBILINOGEN 0.2 01/30/2015 1546   NITRITE NEGATIVE 01/30/2015 1546   LEUKOCYTESUR SMALL (A) 01/30/2015 1546    Radiological Exams on Admission: Ct Head Wo Contrast  Result Date: 07/05/2018 CLINICAL DATA:  Altered mental status EXAM: CT HEAD WITHOUT CONTRAST TECHNIQUE: Contiguous axial images were obtained from the base of the skull through the vertex without intravenous contrast. COMPARISON:  01/01/2007 CT FINDINGS: BRAIN: There is age related sulcal and ventricular prominence consistent with mild superficial and central atrophy. No intraparenchymal hemorrhage, mass effect nor midline shift. Periventricular and subcortical white matter hypodensities consistent with chronic mild small vessel ischemic disease are identified. No acute large vascular territory infarcts. No abnormal extra-axial fluid collections. Basal cisterns are not effaced and midline brainstem and cerebellum are nonacute. VASCULAR: Mild-to-moderate atherosclerosis of the carotid siphons. No hyperdense vessel or unexpected calcifications. SKULL: No skull fracture. No significant scalp soft tissue swelling. SINUSES/ORBITS: The mastoid air-cells are clear. The included paranasal sinuses are well-aerated.Bilateral lens replacements. OTHER: None. IMPRESSION: Atrophy with chronic small vessel ischemic disease. No acute  intracranial abnormality. Electronically Signed   By: Tollie Ethavid  Kwon M.D.   On: 07/05/2018 22:17   Dg Chest Port 1 View  Result Date: 07/05/2018 CLINICAL DATA:  Fever and nonproductive cough for 3 days. EXAM: PORTABLE CHEST 1 VIEW COMPARISON:  04/19/2014 FINDINGS: Top-normal heart size with moderate aortic atherosclerosis. No aneurysm is identified. Atelectasis is seen at the left lung base. No pulmonary consolidation, pulmonary edema, effusion or pneumothorax. Left-sided pacemaker apparatus with leads in the right atrium and  right ventricle are noted. No acute osseous abnormality is seen. IMPRESSION: No active disease. Electronically Signed   By: Tollie Eth M.D.   On: 07/05/2018 22:16    EKG: Independently reviewed.  Assessment/Plan Principal Problem:   TIA (transient ischemic attack) Active Problems:   Persistent atrial fibrillation   Essential hypertension   Fever   Hyperthyroidism   Gout    1. Episode of speech difficulty - concerning for TIA vs delirium secondary to fever 1. TIA pathway and work up 2. MRI/MRA - Patient has pacemaker card with her that states her pacemaker is an MRI compatible model (MR conditional).  Per Radiology this will be done in AM. 3. PT/OT/SLP 4. 2d echo 5. Carotid dopplers 2. Fever - 1. UA 2. Influenza PCR 3. Tylenol PRN 4. "gently hydrate" by holding lasix 3. Gout - continue allopurinol 4. Hyperthyroidism - 1. Check TSH 2. Continue tapazole 5. PAF - 1. Continue lopressor 2. Continue Tikosyn 3. Continue eliquis 6. HTN - 1. Holding avapro and lasix to allow permissive HTN. In setting of possible TIA.  DVT prophylaxis: Eliquis Code Status: Full Family Communication: Family at bedside Disposition Plan: TBD Consults called: None Admission status: Place in [place], [place]. DO [facility] Pager [phone] Only works nights!  If 7AM-7PM, please contact the primary day team physician taking care of  patient  [email or web address] Password [facility]  07/06/2018, 12:39 AM

## 2018-07-06 NOTE — Progress Notes (Signed)
Pt transported to MRI at 1200

## 2018-07-06 NOTE — Care Management Obs Status (Signed)
MEDICARE OBSERVATION STATUS NOTIFICATION   Patient Details  Name: Crystal Hess MRN: [id number] Date of Birth: [birth year]   Medicare Observation Status Notification Given:  Yes  Patient with cognitive deficients; CM attempted to discuss with patients nephew via phone, with no answer. MOON left at patients bedside.  Lollie MarrowNatalie Y Shakedra Beam, RN 07/06/2018, 3:52 PM

## 2018-07-06 NOTE — Consult Note (Addendum)
Neurology Consultation  Reason for Consult: Concern for meningitis Referring Physician: Willette PaSheehan  CC: Concern for meningitis secondary to MRI findings  History is obtained from: Patient and chart  HPI: Crystal Hess is a 84 y.o. female with multiple medical problems including obesity, hypothyroidism, mitral GERD gestation, hypertension, dysrhythmia- sinus node dysfunction with intercurrent A. fib thus on Eliquis..  Patient apparently presented to the emergency department with complaints of weakness and confusion.  Over the past 2 to 3 weeks she has been having generalized weakness, URI symptoms.  On the date of admission her friend was visiting her and they noted for about 30 minutes she was having difficulty getting her words out.  There was concern for both TIA and there is also concerned that she has not been taking her medications appropriately.  Although she does have a pacemaker for her sinus no dysrhythmia the pacemaker is MRI compatible.  MRI did show an abnormal extra-axial diffusion signal bilaterally at the vertex which was concerning by radiologist for infection/meningitis, no other acute infarct was noted.  Neurology was asked to see the patient for possible meningitis.  Patient does admit to having problems with her words and expressing herself for approximately 30 minutes however she feels she is back to normal at this time.  She discusses more about her diarrhea and feeling weak than any other issues.   ED course: EKG showing atrial fibrillation, afebrile, hypertensive, breathing at room air 97%.   ROS:  ROS was performed and is negative except as noted in the HPI.   Past Medical History:  Diagnosis Date  . Anxiety   . Arthritis    "knees, hands" (11/08/2014)  . Asthma   . Chronic lower back pain   . DJD (degenerative joint disease)   . DOE (dyspnea on exertion)    wears O2 at night but has not had a sleep study  . Dysrhythmia   . Family history of adverse reaction to  anesthesia    "sister's heart stopped and had to be revived; think it was related to the demoral"  . Gout    "I've had it twice already this year" (11/08/2014)  . Hypertension   . Hyperthyroidism   . Mitral regurgitation    mild  . Obesity   . On home oxygen therapy    "2L just at night" (11/08/2014)  . Persistent atrial fibrillation   . Trace tricuspid regurgitation by prior echocardiogram   . Walking pneumonia ~ 1998     Family History  Problem Relation Age of Onset  . Lymphoma Mother   . Lung cancer Father        smoker  . Emphysema Brother      Social History:   reports that she has never smoked. She has never used smokeless tobacco. She reports that she does not drink alcohol or use drugs.  Medications  Current Facility-Administered Medications:  .   stroke: mapping our early stages of recovery book, , Does not apply, Once, Julian ReilGardner, Jared M, DO .  acetaminophen (TYLENOL) tablet 650 mg, 650 mg, Oral, Q4H PRN **OR** acetaminophen (TYLENOL) solution 650 mg, 650 mg, Per Tube, Q4H PRN **OR** acetaminophen (TYLENOL) suppository 650 mg, 650 mg, Rectal, Q4H PRN, Julian ReilGardner, Jared M, DO .  albuterol (PROVENTIL) (2.5 MG/3ML) 0.083% nebulizer solution 3 mL, 3 mL, Inhalation, Q6H PRN, Hillary BowGardner, Jared M, DO .  allopurinol (ZYLOPRIM) tablet 300 mg, 300 mg, Oral, Daily, Lyda PeroneGardner, Jared M, DO, 300 mg at 07/06/18 [id number] .  apixaban (  ELIQUIS) tablet 5 mg, 5 mg, Oral, BID, Hillary BowGardner, Jared M, DO, 5 mg at 07/06/18 [id number] .  colchicine tablet 1.2 mg, 1.2 mg, Oral, Daily PRN, Hillary BowGardner, Jared M, DO .  dofetilide [facility](TIKOSYN) capsule 250 mcg, 250 mcg, Oral, BID, Hillary BowGardner, Jared M, DO, 250 mcg at 07/06/18 [id number] .  gabapentin (NEURONTIN) capsule 100-200 mg, 100-200 mg, Oral, QHS, Gardner, Jared M, DO .  leflunomide (ARAVA) tablet 20 mg, 20 mg, Oral, Daily, Lyda PeroneGardner, Jared M, DO, 20 mg at 07/06/18 [id number] .  loteprednol (LOTEMAX) 0.5 % ophthalmic suspension 1 drop, 1 drop, Both Eyes, BID, Julian ReilGardner, Jared M, DO .   magnesium oxide (MAG-OX) tablet 400 mg, 400 mg, Oral, Daily, Lyda PeroneGardner, Jared M, DO, 400 mg at 07/06/18 [id number] .  methimazole (TAPAZOLE) tablet 5 mg, 5 mg, Oral, QHS, Julian ReilGardner, Jared M, DO .  metoprolol tartrate (LOPRESSOR) tablet 12.5 mg, 12.5 mg, Oral, Daily, Lyda PeroneGardner, Jared M, DO, 12.5 mg at 07/06/18 0936 .  traMADol (ULTRAM) tablet 50 mg, 50 mg, Oral, Q8H PRN, Hillary BowGardner, Jared M, DO   Exam: Current vital signs: BP (!) 171/57 (BP Location: Right Arm)   Pulse 74   Temp 98 F (36.7 C) (Oral)   Resp 20   Ht 5' 3.5" (1.613 m)   Wt 74.8 kg   SpO2 96%   BMI 28.77 kg/m  Vital signs in last 24 hours: Temp:  [98 F (36.7 C)-100.1 F (37.8 C)] 98 F (36.7 C) (12/30 1444) Pulse Rate:  [38-108] 74 (12/30 1444) Resp:  [17-24] 20 (12/30 1444) BP: (100-197)/(54-88) 171/57 (12/30 1444) SpO2:  [95 %-100 %] 96 % (12/30 1444) Weight:  [74.8 kg] 74.8 kg (12/30 1050)  Physical Exam  Constitutional: Appears well-developed and well-nourished.  Psych: Affect appropriate to situation Eyes: No scleral injection HENT: No OP obstrucion Head: Normocephalic.  Cardiovascular: Normal rate and regular rhythm.  Respiratory: Effort normal, non-labored breathing GI: Soft.  No distension. There is no tenderness.  Skin: WDI  Neuro: Mental Status: Patient is awake, alert, oriented to person, place, month, year, and situation. Patient is able to give a clear and coherent history. No signs of aphasia or neglect Cranial Nerves: II: Visual Fields are full.  III,IV, VI: EOMI without ptosis or diploplia. Pupils are equal, round, and reactive to light.   V: Facial sensation is symmetric to temperature VII: Facial movement is symmetric.  VIII: hearing decreased X: Uvula elevates symmetrically XI: Shoulder shrug is symmetric. XII: tongue is midline without atrophy or fasciculations.  - No nuchal rigidity, no  Kernig's-able to move neck with supple range of motion both with active range of motion and passive range  of motion Motor: Tone is normal. Bulk is normal. 5/5 strength was present in all four extremities.  Sensory: Sensation is symmetric to light touch and temperature in the arms and legs. Deep Tendon Reflexes: No ankle jerk, left knee jerk was 2+, right knee jerk was 0 and upper extremity showed 1+ deep tendon reflexes Plantars: Toes are downgoing bilaterally.  Cerebellar: FNF and HKS are intact bilaterally   Labs I have reviewed labs in epic and the results pertinent to this consultation are:   CBC    Component Value Date/Time   WBC 4.2 07/05/2018 2235   RBC 4.27 07/05/2018 2235   HGB 12.6 07/05/2018 2242   HGB 13.3 05/06/2018 1045   HCT 37.0 07/05/2018 2242   HCT 40.2 05/06/2018 1045   PLT 220 07/05/2018 2235   PLT 241 05/06/2018 1045   MCV 87.6  07/05/2018 2235   MCV 90 05/06/2018 1045   MCH 28.6 07/05/2018 2235   MCHC 32.6 07/05/2018 2235   RDW 13.1 07/05/2018 2235   RDW 12.9 05/06/2018 1045   LYMPHSABS 2.1 07/05/2018 2235   MONOABS 0.5 07/05/2018 2235   EOSABS 0.1 07/05/2018 2235   BASOSABS 0.0 07/05/2018 2235    CMP     Component Value Date/Time   NA 132 (L) 07/05/2018 2242   NA 140 05/06/2018 1045   K 3.5 07/05/2018 2242   CL 101 07/05/2018 2242   CO2 22 07/05/2018 2235   GLUCOSE 136 (H) 07/05/2018 2242   BUN 9 07/05/2018 2242   BUN 14 05/06/2018 1045   CREATININE 0.50 07/05/2018 2242   CREATININE 0.63 12/18/2015 0912   CALCIUM 8.7 (L) 07/05/2018 2235   PROT 6.6 07/05/2018 2235   ALBUMIN 2.9 (L) 07/05/2018 2235   AST 15 07/05/2018 2235   ALT 14 07/05/2018 2235   ALKPHOS 65 07/05/2018 2235   BILITOT 0.4 07/05/2018 2235   GFRNONAA >60 07/05/2018 2235   GFRAA >60 07/05/2018 2235    Lipid Panel     Component Value Date/Time   CHOL 102 07/06/2018 0505   TRIG 68 07/06/2018 0505   HDL 35 (L) 07/06/2018 0505   CHOLHDL 2.9 07/06/2018 0505   VLDL 14 07/06/2018 0505   LDLCALC 53 07/06/2018 0505   A1c 5.9 Echocardiogram shows 60% to 65% ejection  fraction no cardiac source of emboli Carotid Doppler showed 1-39% stenosis bilaterally  Imaging I have reviewed the images obtained:  CT-scan of the brain- atrophy with chronic small vessel ischemic disease no acute cranial abnormality  MRI examination of the brain- abnormal extra-axial diffusion signal bilaterally at the vertex.  This was concerning per radiologist for infection/meningitis.  No definite hemorrhage.  No acute infarct.  Although his motion degraded there was no evidence of major branch occlusion or flow-limiting proximal stenosis.  Felicie Morn PA-C [facility] [phone]  M-F  (9:00 am- 5:00 PM)  07/06/2018, 3:43 PM     Assessment:  84 year old female presenting to the hospital secondary to a period of expressive aphasia.  Given the transient nature, I think that medication error is less likely and that TIA is more likely.  That being said, she is already anticoagulated, her LDL is 53, her A1c is not suggestive of diabetes, she has no carotid stenosis that would be an intervening the lesion.  I would not favor changing her Eliquis based on a single event like this.   The incidental findings on her MRI are of unclear significance.  Especially with her on anticoagulation, I think that an LP does have some risk and without her having any symptoms of meningitis, I think that this would be very low yield endeavor.  I do think that it be reasonable to repeat the MRI and would give contrast in 1 to 2 months.  Recommendations: 1) continue Eliquis for secondary stroke prevention 2) repeat imaging in 1 to 2 months with contrast 3) follow-up with outpatient neurology  Ritta Slot, MD [facility] [phone]  If 7pm- 7am, please page neurology on call as listed in AMION.

## 2018-07-06 NOTE — Progress Notes (Signed)
RN went to room to give patient discharge information and patient had a large spell of watery diarrhea. RN spoke with MD sample sent to lab of diarrhea and will keep pt over night for continued observation.

## 2018-07-06 NOTE — Discharge Summary (Addendum)
Physician Discharge Summary  Crystal Hess UJW:[id number] DOB: Feb 29, 1930 DOA: 07/05/2018  PCP: Barbie BannerWilson, Fred H, MD  Admit date: 07/05/2018 Discharge date: 07/06/2018  Admitted From: home  Disposition:  home  Recommendations for Outpatient Follow-up:  1. Follow up with Neurology in 1 month for MRI 2.  Follow up with Primary in 1-2 days  Home Health:*no Equipment/Devices:no  Discharge Condition:stable  CODE STATUS:**FULL Diet recommendation: Heart Healthy   Brief/Interim Summary: Crystal Hess is a 84 y.o. female with medical history significant of A.Fib on eliquis, hyperthyroidism, HTN, gout, PPM placement last year.  Patient presents to the ED with c/o weakness and confusion.  Over past 2-3 weeks, progressive generalized weakness, URI symptoms.  Recently treated for gout in L foot, gout is resolved.  Today her friend was visiting and she had about 30 minutes of speech difficulties where her words were not coming out correctly. There is concern that she has been taking her medications inappropriately. She is a resident at independent living facility  She was admitted to Observation and evaluated.  MRI was evaluated by Neurology and Pt felt to have had a TIA.  She is already anticoagulated with NOAC and therefore addition of aspirin was not advised.  MRI, ECHO, CAROTID DOPPLERS all unremarkable.  Additionally patient has complained of nausea and upset stomach with loose stools.  A viral gastroenterology panel has been ordered.   Patient has reached maximal benefit of hospitalization.  Discharge diagnosis, prognosis, plans, follow-up, medications and treatments discussed with the patient(or responsible party) and is in agreement with the plans as described.  Patient is stable for discharge.  Discharge Diagnoses:  Principal Problem:   TIA (transient ischemic attack) Active Problems:   Persistent atrial fibrillation   Essential hypertension   Fever   Hyperthyroidism    Gout    Discharge Instructions  Discharge Instructions    Diet - low sodium heart healthy   Complete by:  As directed    Increase activity slowly   Complete by:  As directed      Allergies as of 07/06/2018      Reactions   Meperidine And Related Other (See Comments)   "have hallucinations; get combative; I don't want that"   Other Hives   Polyester- severe    Codeine Nausea Only   Methotrexate Derivatives Other (See Comments)   Restless   Vasculera [nutritional Supplements] Other (See Comments)   Unknown      Medication List    TAKE these medications   allopurinol 300 MG tablet Commonly known as:  ZYLOPRIM Take 300 mg by mouth daily.   colchicine 0.6 MG tablet Take 1.2 mg by mouth daily as needed (for gout).   dofetilide 250 MCG capsule Commonly known as:  TIKOSYN Take 1 capsule by mouth 2 (two) times daily.   ELIQUIS 5 MG Tabs tablet Generic drug:  apixaban Take 5 mg by mouth 2 (two) times daily.   furosemide 20 MG tablet Commonly known as:  LASIX Take 1 tablet by mouth every other day.   gabapentin 100 MG capsule Commonly known as:  NEURONTIN Take 100-200 mg by mouth at bedtime.   irbesartan 300 MG tablet Commonly known as:  AVAPRO Take 300 mg by mouth daily.   leflunomide 20 MG tablet Commonly known as:  ARAVA Take 20 mg by mouth daily.   loteprednol 0.5 % ophthalmic suspension Commonly known as:  LOTEMAX Place 1 drop into both eyes 2 (two) times daily.   MAGNESIUM-OXIDE 400 (241.3 Mg)  MG tablet Generic drug:  magnesium oxide TAKE 1 TABLET(400 MG) BY MOUTH DAILY What changed:  See the new instructions.   methimazole 5 MG tablet Commonly known as:  TAPAZOLE Take 5 mg by mouth at bedtime.   metoprolol tartrate 25 MG tablet Commonly known as:  LOPRESSOR Take 12.5 mg by mouth daily.   potassium chloride SA 20 MEQ tablet Commonly known as:  K-DUR,KLOR-CON Take 20 mEq by mouth every other day.   PROVENTIL HFA 108 (90 Base) MCG/ACT  inhaler Generic drug:  albuterol INHALE 2 PUFFS INTO LUNGS EVERY 6 HOURS AS NEEDED FOR WHEEZING AND SHORTNESS OF BREATH What changed:  See the new instructions.   traMADol 50 MG tablet Commonly known as:  ULTRAM Take 50 mg by mouth every 8 (eight) hours as needed for moderate pain.      Follow-up Information    [facility]. Schedule an appointment as soon as possible for a visit in 1 day(s).   Contact information: [address] [place], [address] [place] [address] [phone]         Allergies  Allergen Reactions  . Meperidine And Related Other (See Comments)    "have hallucinations; get combative; I don't want that"  . Other Hives    Polyester- severe   . Codeine Nausea Only  . Methotrexate Derivatives Other (See Comments)    Restless  . Vasculera [Nutritional Supplements] Other (See Comments)    Unknown    Consultations:  Dr Amada Jupiter, Neurology   Procedures/Studies: Ct Head Wo Contrast  Result Date: 07/05/2018 CLINICAL DATA:  Altered mental status EXAM: CT HEAD WITHOUT CONTRAST TECHNIQUE: Contiguous axial images were obtained from the base of the skull through the vertex without intravenous contrast. COMPARISON:  01/01/2007 CT FINDINGS: BRAIN: There is age related sulcal and ventricular prominence consistent with mild superficial and central atrophy. No intraparenchymal hemorrhage, mass effect nor midline shift. Periventricular and subcortical white matter hypodensities consistent with chronic mild small vessel ischemic disease are identified. No acute large vascular territory infarcts. No abnormal extra-axial fluid collections. Basal cisterns are not effaced and midline brainstem and cerebellum are nonacute. VASCULAR: Mild-to-moderate atherosclerosis of the carotid siphons. No hyperdense vessel or unexpected calcifications. SKULL: No skull fracture. No significant scalp soft tissue swelling. SINUSES/ORBITS: The mastoid air-cells are clear. The  included paranasal sinuses are well-aerated.Bilateral lens replacements. OTHER: None. IMPRESSION: Atrophy with chronic small vessel ischemic disease. No acute intracranial abnormality. Electronically Signed   By: Tollie Eth M.D.   On: 07/05/2018 22:17   Mr Brain Wo Contrast  Addendum Date: 07/06/2018   ADDENDUM REPORT: 07/06/2018 16:35 ADDENDUM: Case discussed with Dr. Amada Jupiter via telephone concerning the findings at the vertex. Patient has returned to baseline, incompatible with infectious meningitis given the extent of signal abnormality. Location of the diffusion signal is uncertain as there is no extension into sulci. No blood products or mineralization in this area on prior head CT, question sequela of previous subdural hemorrhage. There is no history of malignancy but still suggest short-term follow-up brain MRI with contrast. Electronically Signed   By: Marnee Spring M.D.   On: 07/06/2018 16:35   Result Date: 07/06/2018 CLINICAL DATA:  TIA. Transient speech disturbance. Confusion, weakness, and fever. EXAM: MRI HEAD WITHOUT CONTRAST MRA HEAD WITHOUT CONTRAST TECHNIQUE: Multiplanar, multiecho pulse sequences of the brain and surrounding structures were obtained without intravenous contrast. Angiographic images of the head were obtained using MRA technique without contrast. COMPARISON:  Head CT 07/05/2018 FINDINGS: MRI HEAD FINDINGS Multiple  sequences are up to moderately motion degraded. Brain: There is abnormal diffusion signal localizing to the extra-axial spaces over the left and to a lesser extent right frontal and parietal lobes at the vertex. This is present on both axial and coronal diffusion acquisitions. No susceptibility artifact is seen on gradient imaging to indicate hemorrhage, and no definite extra-axial fluid collection is evident on conventional sequences or on the recent CT. There is no brain edema in this region. Elsewhere, there is no evidence of acute infarct, mass, or  midline shift. There is mild to moderate cerebral atrophy. T2 hyperintensities in the cerebral white matter are nonspecific but compatible with chronic small vessel ischemic disease, mild for age. Vascular: Major intracranial vascular flow voids are preserved. Skull and upper cervical spine: Unremarkable bone marrow signal. Sinuses/Orbits: Bilateral cataract extraction. A 12 mm T2 hyperintense right medial canthal lesion is compatible with a dacryocystocele. Paranasal sinuses and mastoid air cells are clear. Other: None. MRA HEAD FINDINGS The study is moderately motion degraded. The visualized distal vertebral arteries are patent to the basilar with the left being strongly dominant. Patent right PICA and bilateral SCA origins are seen. Left PICA and AICA is are not clearly identified. The basilar artery is patent without evidence of significant stenosis. Posterior communicating arteries are not clearly identified and may be small or absent. PCAs are patent without evidence of flow limiting proximal stenosis. The internal carotid arteries are widely patent from skull base to carotid termini. ACAs and MCAs are patent without evidence of proximal branch occlusion or significant proximal stenosis allowing for motion artifact. No aneurysm is identified. IMPRESSION: 1. Abnormal extra axial diffusion signal bilaterally at the vertex. This is most concerning for infection/meningitis, and lumbar puncture is recommended for further evaluation. No definite hemorrhage. 2. No acute infarct. 3. Mild chronic small vessel ischemic disease and cerebral atrophy. 4. Motion degraded head MRA without evidence of major branch occlusion or flow limiting proximal stenosis. These results will be called to the ordering clinician or representative by the Radiologist Assistant, and communication documented in the PACS or zVision Dashboard. Electronically Signed: By: Sebastian Ache M.D. On: 07/06/2018 13:50   Dg Chest Port 1 View  Result  Date: 07/05/2018 CLINICAL DATA:  Fever and nonproductive cough for 3 days. EXAM: PORTABLE CHEST 1 VIEW COMPARISON:  04/19/2014 FINDINGS: Top-normal heart size with moderate aortic atherosclerosis. No aneurysm is identified. Atelectasis is seen at the left lung base. No pulmonary consolidation, pulmonary edema, effusion or pneumothorax. Left-sided pacemaker apparatus with leads in the right atrium and right ventricle are noted. No acute osseous abnormality is seen. IMPRESSION: No active disease. Electronically Signed   By: Tollie Eth M.D.   On: 07/05/2018 22:16   Mr Maxine Glenn Head Wo Contrast  Addendum Date: 07/06/2018   ADDENDUM REPORT: 07/06/2018 16:35 ADDENDUM: Case discussed with Dr. Amada Jupiter via telephone concerning the findings at the vertex. Patient has returned to baseline, incompatible with infectious meningitis given the extent of signal abnormality. Location of the diffusion signal is uncertain as there is no extension into sulci. No blood products or mineralization in this area on prior head CT, question sequela of previous subdural hemorrhage. There is no history of malignancy but still suggest short-term follow-up brain MRI with contrast. Electronically Signed   By: Marnee Spring M.D.   On: 07/06/2018 16:35   Result Date: 07/06/2018 CLINICAL DATA:  TIA. Transient speech disturbance. Confusion, weakness, and fever. EXAM: MRI HEAD WITHOUT CONTRAST MRA HEAD WITHOUT CONTRAST TECHNIQUE: Multiplanar, multiecho pulse  sequences of the brain and surrounding structures were obtained without intravenous contrast. Angiographic images of the head were obtained using MRA technique without contrast. COMPARISON:  Head CT 07/05/2018 FINDINGS: MRI HEAD FINDINGS Multiple sequences are up to moderately motion degraded. Brain: There is abnormal diffusion signal localizing to the extra-axial spaces over the left and to a lesser extent right frontal and parietal lobes at the vertex. This is present on both axial and  coronal diffusion acquisitions. No susceptibility artifact is seen on gradient imaging to indicate hemorrhage, and no definite extra-axial fluid collection is evident on conventional sequences or on the recent CT. There is no brain edema in this region. Elsewhere, there is no evidence of acute infarct, mass, or midline shift. There is mild to moderate cerebral atrophy. T2 hyperintensities in the cerebral white matter are nonspecific but compatible with chronic small vessel ischemic disease, mild for age. Vascular: Major intracranial vascular flow voids are preserved. Skull and upper cervical spine: Unremarkable bone marrow signal. Sinuses/Orbits: Bilateral cataract extraction. A 12 mm T2 hyperintense right medial canthal lesion is compatible with a dacryocystocele. Paranasal sinuses and mastoid air cells are clear. Other: None. MRA HEAD FINDINGS The study is moderately motion degraded. The visualized distal vertebral arteries are patent to the basilar with the left being strongly dominant. Patent right PICA and bilateral SCA origins are seen. Left PICA and AICA is are not clearly identified. The basilar artery is patent without evidence of significant stenosis. Posterior communicating arteries are not clearly identified and may be small or absent. PCAs are patent without evidence of flow limiting proximal stenosis. The internal carotid arteries are widely patent from skull base to carotid termini. ACAs and MCAs are patent without evidence of proximal branch occlusion or significant proximal stenosis allowing for motion artifact. No aneurysm is identified. IMPRESSION: 1. Abnormal extra axial diffusion signal bilaterally at the vertex. This is most concerning for infection/meningitis, and lumbar puncture is recommended for further evaluation. No definite hemorrhage. 2. No acute infarct. 3. Mild chronic small vessel ischemic disease and cerebral atrophy. 4. Motion degraded head MRA without evidence of major branch  occlusion or flow limiting proximal stenosis. These results will be called to the ordering clinician or representative by the Radiologist Assistant, and communication documented in the PACS or zVision Dashboard. Electronically Signed: By: Sebastian Ache M.D. On: 07/06/2018 13:50   Vas US Carotid (at [facility])  Result Date: 07/06/2018 Carotid Arterial Duplex Study Indications: TIA. Limitations: patient talking and movement Performing Technologist: Blanch Media RVS  Examination Guidelines: A complete evaluation includes B-mode imaging, spectral Doppler, color Doppler, and power Doppler as needed of all accessible portions of each vessel. Bilateral testing is considered an integral part of a complete examination. Limited examinations for reoccurring indications may be performed as noted.  Right Carotid Findings: +----------+--------+--------+--------+------------+--------+           PSV cm/sEDV cm/sStenosisDescribe    Comments +----------+--------+--------+--------+------------+--------+ CCA Prox  64      0               heterogenous         +----------+--------+--------+--------+------------+--------+ CCA Distal52      7               heterogenous         +----------+--------+--------+--------+------------+--------+ ICA Prox  82      13      1-39%   heterogenous         +----------+--------+--------+--------+------------+--------+ ICA Distal75  10                                   +----------+--------+--------+--------+------------+--------+ ECA       101                                          +----------+--------+--------+--------+------------+--------+ +----------+--------+-------+--------+-------------------+           PSV cm/sEDV cmsDescribeArm Pressure (mmHG) +----------+--------+-------+--------+-------------------+ Subclavian102                                        +----------+--------+-------+--------+-------------------+  +---------+--------+--+--------+---------+ VertebralPSV cm/s39EDV cm/sAntegrade +---------+--------+--+--------+---------+  Left Carotid Findings: +----------+--------+--------+--------+------------+--------+           PSV cm/sEDV cm/sStenosisDescribe    Comments +----------+--------+--------+--------+------------+--------+ CCA Prox  78      12              heterogenous         +----------+--------+--------+--------+------------+--------+ CCA Distal67      12              heterogenous         +----------+--------+--------+--------+------------+--------+ ICA Prox  80      12      1-39%   heterogenous         +----------+--------+--------+--------+------------+--------+ ICA Distal91      11                                   +----------+--------+--------+--------+------------+--------+ ECA       146     9                                    +----------+--------+--------+--------+------------+--------+ +----------+--------+--------+--------+-------------------+ SubclavianPSV cm/sEDV cm/sDescribeArm Pressure (mmHG) +----------+--------+--------+--------+-------------------+           117                                         +----------+--------+--------+--------+-------------------+ +---------+--------+--+--------+--+---------+ VertebralPSV cm/s55EDV cm/s10Antegrade +---------+--------+--+--------+--+---------+  Summary: Right Carotid: Velocities in the right ICA are consistent with a 1-39% stenosis. Left Carotid: Velocities in the left ICA are consistent with a 1-39% stenosis. Vertebrals: Bilateral vertebral arteries demonstrate antegrade flow. *See table(s) above for measurements and observations.  Electronically signed by Waverly Ferrari MD on 07/06/2018 at 5:10:53 PM.    Final      ECHOCARDIOGRAM: Left ventricle: The cavity size was normal. There was mild   concentric hypertrophy. Systolic function was normal. The   estimated ejection  fraction was in the range of 60% to 65%. Wall   motion was normal; there were no regional wall motion   abnormalities. Doppler parameters are consistent with abnormal   left ventricular relaxation (grade 1 diastolic dysfunction). - Right ventricle: Pacer wire or catheter noted in right ventricle.  Impressions:  - No cardiac source of emboli was indentified.   Subjective: Feeling back to baseline Discharge Exam: Vitals:   07/06/18 1444 07/06/18 1658  BP: (!) 171/57 (!) 156/59  Pulse: 74 76  Resp: 20 20  Temp: 98 F (36.7 C) 99.7 F (37.6 C)  SpO2: 96% 98%   Vitals:   07/06/18 0835 07/06/18 1050 07/06/18 1444 07/06/18 1658  BP: (!) 173/69  (!) 171/57 (!) 156/59  Pulse: 71  74 76  Resp: 18  20 20   Temp: 98.5 F (36.9 C)  98 F (36.7 C) 99.7 F (37.6 C)  TempSrc: Oral  Oral Oral  SpO2: 95%  96% 98%  Weight:  74.8 kg    Height:  5' 3.5" (1.613 m)      General: Pt is alert, awake, not in acute distress Cardiovascular: RRR, S1/S2 +, no rubs, no gallops Respiratory: CTA bilaterally, no wheezing, no rhonchi Abdominal: Soft, NT, ND, bowel sounds + Extremities: no edema, no cyanosis    The results of significant diagnostics from this hospitalization (including imaging, microbiology, ancillary and laboratory) are listed below for reference.     Microbiology: Recent Results (from the past 240 hour(s))  Blood Culture (routine x 2)     Status: None (Preliminary result)   Collection Time: 07/05/18 10:30 PM  Result Value Ref Range Status   Specimen Description BLOOD RIGHT ARM  Final   Special Requests   Final    BOTTLES DRAWN AEROBIC AND ANAEROBIC Blood Culture results may not be optimal due to an excessive volume of blood received in culture bottles   Culture   Final    NO GROWTH < 24 HOURS Performed at [facility], [address] [place], [place] [address]    Report Status PENDING  Incomplete  Blood Culture (routine x 2)     Status: None (Preliminary result)    Collection Time: 07/05/18 10:40 PM  Result Value Ref Range Status   Specimen Description BLOOD RIGHT HAND  Final   Special Requests   Final    BOTTLES DRAWN AEROBIC ONLY Blood Culture results may not be optimal due to an excessive volume of blood received in culture bottles   Culture   Final    NO GROWTH < 24 HOURS Performed at [facility], [address] [place], [place] [address]    Report Status PENDING  Incomplete     Labs: BNP (last 3 results) No results for input(s): BNP in the last 8760 hours. Basic Metabolic Panel: Recent Labs  Lab 07/05/18 2235 07/05/18 2242  NA 132* 132*  K 3.6 3.5  CL 101 101  CO2 22  --   GLUCOSE 138* 136*  BUN 8 9  CREATININE 0.61 0.50  CALCIUM 8.7*  --    Liver Function Tests: Recent Labs  Lab 07/05/18 2235  AST 15  ALT 14  ALKPHOS 65  BILITOT 0.4  PROT 6.6  ALBUMIN 2.9*   No results for input(s): LIPASE, AMYLASE in the last 168 hours. No results for input(s): AMMONIA in the last 168 hours. CBC: Recent Labs  Lab 07/05/18 2235 07/05/18 2242  WBC 4.2  --   NEUTROABS 1.5*  --   HGB 12.2 12.6  HCT 37.4 37.0  MCV 87.6  --   PLT 220  --    Cardiac Enzymes: No results for input(s): CKTOTAL, CKMB, CKMBINDEX, TROPONINI in the last 168 hours. BNP: Invalid input(s): POCBNP CBG: No results for input(s): GLUCAP in the last 168 hours. D-Dimer No results for input(s): DDIMER in the last 72 hours. Hgb A1c Recent Labs    07/06/18 0505  HGBA1C 5.9*   Lipid Profile Recent Labs    07/06/18 0505  CHOL 102  HDL 35*  LDLCALC 53  TRIG 68  CHOLHDL 2.9   Thyroid function studies Recent Labs    07/06/18 0023  TSH 0.591   Anemia work up No results for input(s): VITAMINB12, FOLATE, FERRITIN, TIBC, IRON, RETICCTPCT in the last 72 hours. Urinalysis    Component Value Date/Time   COLORURINE STRAW (A) 07/06/2018 0025   APPEARANCEUR CLEAR 07/06/2018 0025   LABSPEC 1.003 (L) 07/06/2018 0025   PHURINE 6.0 07/06/2018  0025   GLUCOSEU NEGATIVE 07/06/2018 0025   HGBUR SMALL (A) 07/06/2018 0025   BILIRUBINUR NEGATIVE 07/06/2018 0025   KETONESUR NEGATIVE 07/06/2018 0025   PROTEINUR NEGATIVE 07/06/2018 0025   UROBILINOGEN 0.2 01/30/2015 1546   NITRITE NEGATIVE 07/06/2018 0025   LEUKOCYTESUR NEGATIVE 07/06/2018 0025   Sepsis Labs Invalid input(s): PROCALCITONIN,  WBC,  LACTICIDVEN Microbiology Recent Results (from the past 240 hour(s))  Blood Culture (routine x 2)     Status: None (Preliminary result)   Collection Time: 07/05/18 10:30 PM  Result Value Ref Range Status   Specimen Description BLOOD RIGHT ARM  Final   Special Requests   Final    BOTTLES DRAWN AEROBIC AND ANAEROBIC Blood Culture results may not be optimal due to an excessive volume of blood received in culture bottles   Culture   Final    NO GROWTH < 24 HOURS Performed at [facility], [address], [place], [place] [address]    Report Status PENDING  Incomplete  Blood Culture (routine x 2)     Status: None (Preliminary result)   Collection Time: 07/05/18 10:40 PM  Result Value Ref Range Status   Specimen Description BLOOD RIGHT HAND  Final   Special Requests   Final    BOTTLES DRAWN AEROBIC ONLY Blood Culture results may not be optimal due to an excessive volume of blood received in culture bottles   Culture   Final    NO GROWTH < 24 HOURS Performed at [facility], [address], [place], [place] [address]    Report Status PENDING  Incomplete     Time coordinating discharge: 41 minutes  SIGNED:   Lahoma Crocker, MD  FACP [facility] 07/06/2018, 5:14 PM Pager   If 7PM-7AM, please contact night-coverage [email or web address] Password [email or web address]

## 2018-07-06 NOTE — Progress Notes (Signed)
Occupational Therapy Progress Note  OT eval completed.  Full note to follow.  [facility]  She currently is limited by decreased activity tolerance and generalized weakness, as well as cognitive deficits.  She was limited with activity by constant diarrhea and only able to move sit to stand with min guard assist.    Recommend  SNF level rehab.    Jeani HawkingWendi Hughey Rittenberry, OTR/L Geologist, engineeringAcute Rehabilitation Services Pager [phone] Office [phone]

## 2018-07-07 ENCOUNTER — Telehealth (HOSPITAL_COMMUNITY): Payer: Self-pay

## 2018-07-07 LAB — GASTROINTESTINAL PANEL BY PCR, STOOL (REPLACES STOOL CULTURE)

## 2018-07-07 LAB — URINE CULTURE: Culture: NO GROWTH

## 2018-07-07 MED ORDER — LOPERAMIDE HCL 2 MG PO TABS: 2.0000 mg | ORAL_TABLET | Freq: Four times a day (QID) | ORAL | 0 refills | Status: AC | PRN

## 2018-07-07 NOTE — Evaluation (Signed)
Physical Therapy Evaluation Patient Details Name: Crystal Hess MRN: [id number] DOB: 07/30/1929 Today's Date: 07/07/2018   History of Present Illness  This 84 y.o. female with medical history significant of A.Fib on eliquis, hyperthyroidism, HTN, gout, PPM placement last year, presented to the ED with c/o weakness, confusion, and diffiulty with speech.  MRI was evaluated by Neurology and Pt felt to have had a TIA.  Pt also with frequent watery diarrhea, there is some concern she has not been taking her medications correctly.  Work up underway  Clinical Impression  Pt admitted with above diagnosis. Pt currently with functional limitations due to the deficits listed below (see PT Problem List). At the time of PT eval pt was able to perform transfers and ambulation with up to min assist for balance support, walker management, and general safety. Overall awareness of deficits and safety is decreased and pt feels she will be able to manage alone even though she required intermittent assist throughout our session. Pt [facility] assist her if needed, however unsure if the level of assist will be increased for her while she is in the independent living setting. I think this patient could benefit from short term rehab in the SNF setting to maximize functional independence and decrease risk for falls prior to return to independent living apartment. Acutely, pt will benefit from skilled PT to increase their independence and safety with mobility to allow discharge to the venue listed below.     Follow Up Recommendations SNF;Supervision/Assistance - 24 hour    Equipment Recommendations  None recommended by PT    Recommendations for Other Services       Precautions / Restrictions Precautions Precautions: Fall Restrictions Weight Bearing Restrictions: No      Mobility  Bed Mobility Overal bed mobility: Needs Assistance Bed Mobility: Supine to Sit      Supine to sit: Min guard     General bed mobility comments: Increased time and effort to transition fully to EOB.   Transfers Overall transfer level: Needs assistance Equipment used: Rolling walker (2 wheeled) Transfers: Sit to/from Stand Sit to Stand: Min assist;Min guard         General transfer comment: at times min A to steady  Ambulation/Gait Ambulation/Gait assistance: Min guard;Min assist Gait Distance (Feet): 250 Feet Assistive device: Rolling walker (2 wheeled) Gait Pattern/deviations: Step-through pattern;Decreased stride length;Trunk flexed Gait velocity: Decreased Gait velocity interpretation: 1.31 - 2.62 ft/sec, indicative of limited community ambulator General Gait Details: Occasional assist for balance support and walker management. Pt reports she typically uses a rollator and feels it is easier for her to handle.   Stairs            Wheelchair Mobility    Modified Rankin (Stroke Patients Only)       Balance Overall balance assessment: Needs assistance Sitting-balance support: Feet supported Sitting balance-Leahy Scale: Fair     Standing balance support: Bilateral upper extremity supported Standing balance-Leahy Scale: Poor Standing balance comment: reliant on UE support                             Pertinent Vitals/Pain Pain Assessment: No/denies pain    Home Living Family/patient expects to be discharged to:: Other (Comment) Living Arrangements: Alone               Additional Comments: Pt resides [facility] in [place].  She reports walk  in shower and level entry.  She has RW    Prior Function Level of Independence: Independent         Comments: Pt indicates she is independent and drives     Hand Dominance   Dominant Hand: Right    Extremity/Trunk Assessment   Upper Extremity Assessment Upper Extremity Assessment: Generalized weakness    Lower Extremity Assessment Lower Extremity  Assessment: Generalized weakness    Cervical / Trunk Assessment Cervical / Trunk Assessment: Kyphotic;Other exceptions Cervical / Trunk Exceptions: Forward head posture with rounded shoulders  Communication   Communication: No difficulties  Cognition Arousal/Alertness: Awake/alert Behavior During Therapy: WFL for tasks assessed/performed;Impulsive Overall Cognitive Status: Impaired/Different from baseline Area of Impairment: Attention;Following commands;Safety/judgement;Problem solving                   Current Attention Level: Selective   Following Commands: Follows one step commands consistently;Follows multi-step commands inconsistently Safety/Judgement: Decreased awareness of safety   Problem Solving: Slow processing;Requires verbal cues        General Comments      Exercises     Assessment/Plan    PT Assessment Patient needs continued PT services  PT Problem List Decreased strength;Decreased activity tolerance;Decreased balance;Decreased mobility;Decreased knowledge of use of DME;Decreased safety awareness;Decreased knowledge of precautions       PT Treatment Interventions DME instruction;Gait training;Functional mobility training;Therapeutic activities;Therapeutic exercise;Neuromuscular re-education;Patient/family education;Cognitive remediation    PT Goals (Current goals can be found in the Care Plan section)  Acute Rehab PT Goals Patient Stated Goal: to stop having diarrhea PT Goal Formulation: With patient Time For Goal Achievement: 07/21/18 Potential to Achieve Goals: Good    Frequency Min 2X/week   Barriers to discharge Decreased caregiver support Lives alone - feel she will need supervision initially.     Co-evaluation               AM-PAC PT "6 Clicks" Mobility  Outcome Measure Help needed turning from your back to your side while in a flat bed without using bedrails?: None Help needed moving from lying on your back to sitting on the  side of a flat bed without using bedrails?: A Little Help needed moving to and from a bed to a chair (including a wheelchair)?: A Little Help needed standing up from a chair using your arms (e.g., wheelchair or bedside chair)?: A Little Help needed to walk in hospital room?: A Little Help needed climbing 3-5 steps with a railing? : A Lot 6 Click Score: 18    End of Session Equipment Utilized During Treatment: Gait belt Activity Tolerance: Patient tolerated treatment well Patient left: in chair;with call bell/phone within reach Nurse Communication: Mobility status PT Visit Diagnosis: Unsteadiness on feet (R26.81);Muscle weakness (generalized) (M62.81);Difficulty in walking, not elsewhere classified (R26.2)    Time: [id number] PT Time Calculation (min) (ACUTE ONLY): 22 min   Charges:   PT Evaluation $PT Eval Moderate Complexity: 1 Mod          Conni SlipperLaura Bryah Ocheltree, PT, DPT Acute Rehabilitation Services Pager: [phone] Office: [phone]   Marylynn PearsonLaura D Becky Berberian 07/07/2018, 12:20 PM

## 2018-07-07 NOTE — Progress Notes (Signed)
Patient seen and evaluated this morning.  She denies any abdominal pain, nausea, or vomiting and is tolerating diet.  She has no further symptoms of diarrhea.  Recommend Imodium outpatient as needed for any further symptoms.  No recent antibiotic use noted either.  Recommend following up with PCP in next 1 to 2 days for reevaluation as needed.  Otherwise stable for discharge.  Please refer to discharge summary on 12/30.

## 2018-07-07 NOTE — Progress Notes (Signed)
CSW alerted at 1:48 PM that the SNF was contacted by patient that she was being discharged back to her apartment. CSW contacted RN, who indicated that patient had been discharged back to her independent living apartment. CSW explained that SNF had been recommended, but RN said that she sent the patient home instead.   CSW alerted facility that patient had been discharged back to her independent living apartment instead, as they had been planning for SNF placement for her. Facility to continue to pursue SNF and hopeful that they can still admit her when insurance comes back.  CSW signing off.  Blenda NicelyElizabeth Jackalyn Haith, [place] Clinical Social Worker [phone]

## 2018-07-07 NOTE — [place] FL2 (Signed)
[place] MEDICAID FL2 LEVEL OF CARE SCREENING TOOL     IDENTIFICATION  Patient Name: Crystal RipperFrances I Coaxum Birthdate: 03-Feb-1930 Sex: female Admission Date (Current Location): 07/05/2018  [facility] [place]   Facility and Address:  [facility], [address], [place], [place] [address]      Provider Number: [id number]  Attending Physician Name and Address:  Erick BlinksShah, Pratik D, DO  Relative Name and Phone Number:       Current Level of Care: Hospital Recommended Level of Care: Skilled Nursing Facility Prior Approval Number:    Date Approved/Denied:   PASRR Number: [id number]  Discharge Plan: SNF    Current Diagnoses: Patient Active Problem List   Diagnosis Date Noted  . Fever 07/06/2018  . TIA (transient ischemic attack) 07/06/2018  . Hyperthyroidism 07/06/2018  . Gout 07/06/2018  . S/P total knee arthroplasty 02/06/2015  . Visit for monitoring Tikosyn therapy 11/08/2014  . Persistent atrial fibrillation 10/10/2014  . Snoring 10/10/2014  . Morbid obesity (HCC) 10/10/2014  . Essential hypertension 10/10/2014  . Acute bronchitis 08/10/2012  . Syncope 12/07/2010  . ALLERGIC RHINITIS 05/04/2008  . Allergic-infective asthma 05/04/2008    Orientation RESPIRATION BLADDER Height & Weight     Time, Self, Situation, Place  Normal Continent Weight: 165 lb (74.8 kg) Height:  5' 3.5" (161.3 cm)  BEHAVIORAL SYMPTOMS/MOOD NEUROLOGICAL BOWEL NUTRITION STATUS      Continent Diet(see DC Summary)  AMBULATORY STATUS COMMUNICATION OF NEEDS Skin   Limited Assist Verbally Normal                       Personal Care Assistance Level of Assistance  Bathing, Feeding, Dressing Bathing Assistance: Limited assistance Feeding assistance: Independent Dressing Assistance: Limited assistance     Functional Limitations Info  Sight, Hearing, Speech Sight Info: Adequate Hearing Info: Adequate Speech Info: Adequate    SPECIAL CARE  FACTORS FREQUENCY  PT (By licensed PT), OT (By licensed OT)     PT Frequency: 5x/wk OT Frequency: 5x/wk            Contractures Contractures Info: Not present    Additional Factors Info  Code Status, Allergies Code Status Info: Full Allergies Info: Meperidine And Related, Other, Codeine, Methotrexate Derivatives, Vasculera Nutritional Supplements           Current Medications (07/07/2018):  This is the current hospital active medication list Current Facility-Administered Medications  Medication Dose Route Frequency Provider Last Rate Last Dose  .  stroke: mapping our early stages of recovery book   Does not apply Once Julian ReilGardner, Jared M, DO      . acetaminophen (TYLENOL) tablet 650 mg  650 mg Oral Q4H PRN Hillary BowGardner, Jared M, DO       Or  . acetaminophen (TYLENOL) solution 650 mg  650 mg Per Tube Q4H PRN Hillary BowGardner, Jared M, DO       Or  . acetaminophen (TYLENOL) suppository 650 mg  650 mg Rectal Q4H PRN Hillary BowGardner, Jared M, DO      . albuterol (PROVENTIL) (2.5 MG/3ML) 0.083% nebulizer solution 3 mL  3 mL Inhalation Q6H PRN Hillary BowGardner, Jared M, DO      . allopurinol (ZYLOPRIM) tablet 300 mg  300 mg Oral Daily Lyda PeroneGardner, Jared M, DO   300 mg at 07/07/18 1057  . apixaban (ELIQUIS) tablet 5 mg  5 mg Oral BID Lyda PeroneGardner, Jared M, DO   5 mg at 07/07/18 1057  . colchicine tablet  1.2 mg  1.2 mg Oral Daily PRN Hillary BowGardner, Jared M, DO      . dofetilide [facility](TIKOSYN) capsule 250 mcg  250 mcg Oral BID Hillary BowGardner, Jared M, DO   250 mcg at 07/07/18 1057  . gabapentin (NEURONTIN) capsule 100-200 mg  100-200 mg Oral QHS Lyda PeroneGardner, Jared M, DO   100 mg at 07/06/18 2036  . leflunomide (ARAVA) tablet 20 mg  20 mg Oral Daily Lyda PeroneGardner, Jared M, DO   20 mg at 07/07/18 1057  . loratadine (CLARITIN) tablet 10 mg  10 mg Oral Daily Schorr, Roma KayserKatherine P, NP   10 mg at 07/07/18 1057  . loteprednol (LOTEMAX) 0.5 % ophthalmic suspension 1 drop  1 drop Both Eyes BID Lyda PeroneGardner, Jared M, DO      . magnesium oxide (MAG-OX) tablet 400 mg  400  mg Oral Daily Lyda PeroneGardner, Jared M, DO   400 mg at 07/07/18 1057  . methimazole (TAPAZOLE) tablet 5 mg  5 mg Oral QHS Lyda PeroneGardner, Jared M, DO      . metoprolol tartrate (LOPRESSOR) tablet 12.5 mg  12.5 mg Oral Daily Lyda PeroneGardner, Jared M, DO   12.5 mg at 07/07/18 1057  . traMADol (ULTRAM) tablet 50 mg  50 mg Oral Q8H PRN Hillary BowGardner, Jared M, DO      . Zinc Oxide (TRIPLE PASTE) 12.8 % ointment   Topical PRN Schorr, Roma KayserKatherine P, NP         Discharge Medications: Please see discharge summary for a list of discharge medications.  Relevant Imaging Results:  Relevant Lab Results:   Additional Information SS#: [id number]  Baldemar LenisElizabeth M Oryon Gary, LCSW

## 2018-07-07 NOTE — Progress Notes (Signed)
Occupational Therapy Evaluation (late entry)  Pt admitted with the below listed deficits. She presents to OT with generalized weakness, decreased activity tolerance, impaired cognition including deficits with attention, safety awareness, judgement, and problem solving.   Eval was limited by constant diarrhea when attempting to move. She requires mod - max A for LB ADLs and set up to min A for UB ADLs, min A for functional transfers.   She reports she lives in [facility] in [place], and was fully independent, including driving PTA (no family present to confirm).   At this time, feel she will need SNF level rehab prior to returning to ILF to maximize her safety and independence.    Recommend SNF    07/06/18 1500  OT Visit Information  Last OT Received On 07/07/18  History of Present Illness This 84 y.o. female with medical history significant of A.Fib on eliquis, hyperthyroidism, HTN, gout, PPM placement last year, presented to the ED with c/o weakness, confusion, and diffiulty with speech.  MRI was evaluated by Neurology and Pt felt to have had a TIA.  Pt also with frequent watery diarrhea, there is some concern she has not been taking her medications correctly.  Work up underway  Precautions  Precautions Fall  Home Living  Family/patient expects to be discharged to: Other (Comment)  Living Arrangements Alone  Additional Comments [facility] [place].  She reports walk in shower and level entry.  She has RW  Prior Function  Level of Independence Independent  Comments Pt indicates she is independent and drives  Communication  Communication No difficulties  Pain Assessment  Pain Assessment No/denies pain  Cognition  Arousal/Alertness Awake/alert  Behavior During Therapy WFL for tasks assessed/performed;Impulsive  Overall Cognitive Status Impaired/Different from baseline  Area of Impairment Attention;Following commands;Safety/judgement;Problem  solving  Current Attention Level Sustained (with cues)  Following Commands Follows one step commands consistently;Follows multi-step commands inconsistently  Safety/Judgement Decreased awareness of safety  Problem Solving Difficulty sequencing;Requires verbal cues;Requires tactile cues  General Comments Pt is very talkative, frequently self distracts, and at times, difficult to redirect to task at hand.  She doesn't consistently follow commands due to distraction.  She is impulsive with poor awareness of safety and current circumstances.  She occasionally contradicted herself when providing info re: family and living situation   Upper Extremity Assessment  Upper Extremity Assessment Generalized weakness  Lower Extremity Assessment  Lower Extremity Assessment Defer to PT evaluation  ADL  Overall ADL's  Needs assistance/impaired  Eating/Feeding Independent  Eating/Feeding Details (indicate cue type and reason) pt very messy with food products all over gown   Grooming Wash/dry hands;Wash/dry face;Oral care;Brushing hair;Set up;Sitting  Upper Body Bathing Supervision/ safety;Sitting  Lower Body Bathing Minimal assistance;Sit to/from stand  Upper Body Dressing  Set up;Supervision/safety;Sitting  Lower Body Dressing Maximal assistance;Sit to/from stand  Lower Body Dressing Details (indicate cue type and reason) hindered by freqenty and continuous diarrhea   Toilet Transfer Minimal assistance;Stand-pivot;BSC  Toileting- Clothing Manipulation and Hygiene Sit to/from stand;Maximal assistance  Toileting - Clothing Manipulation Details (indicate cue type and reason) Pt incontinent of stool, at times with no awareness. Other times she states, it's coming, but makes no attempt to intiate a solution   Functional mobility during ADLs Minimal assistance;Rolling walker  Vision- History  Baseline Vision/History Wears glasses  Wears Glasses At all times  Patient Visual Report No change from baseline  Bed  Mobility  Overal bed mobility Needs Assistance  Bed Mobility Supine to Sit;Sit to Supine  Supine to sit Min guard  Sit to supine Min assist  General bed mobility comments frequent cues to perform task as she self distracts.  assist provided to scoot up in bed and for postioning   Transfers  Overall transfer level Needs assistance  Equipment used Rolling walker (2 wheeled)  Transfers Sit to/from Stand  Sit to Stand Min assist;Min guard  General transfer comment at times min A to steady  Balance  Overall balance assessment Needs assistance  Sitting-balance support Feet supported  Sitting balance-Leahy Scale Fair  Standing balance support Bilateral upper extremity supported  Standing balance-Leahy Scale Poor  Standing balance comment reliant on UE support  General Comments  General comments (skin integrity, edema, etc.) discussed recommendation for SNF with pt, she is agreeable   OT - End of Session  Equipment Utilized During Treatment Rolling walker;Gait belt  Activity Tolerance Treatment limited secondary to medical complications (Comment) (diarrhea)  Patient left in bed;with call bell/phone within reach  Nurse Communication Mobility status  OT Assessment  OT Recommendation/Assessment Patient needs continued OT Services  OT Visit Diagnosis Unsteadiness on feet (R26.81)  OT Problem List Decreased strength;Decreased activity tolerance;Impaired balance (sitting and/or standing);Decreased cognition;Decreased safety awareness  Barriers to Discharge Decreased caregiver support  OT Plan  OT Frequency (ACUTE ONLY) Min 2X/week  OT Treatment/Interventions (ACUTE ONLY) Self-care/ADL training;DME and/or AE instruction;Therapeutic activities;Cognitive remediation/compensation;Patient/family education;Balance training  AM-PAC OT "6 Clicks" Daily Activity Outcome Measure (Version 2)  Help from another person eating meals? 4  Help from another person taking care of personal grooming? 3  Help  from another person toileting, which includes using toliet, bedpan, or urinal? 2  Help from another person bathing (including washing, rinsing, drying)? 2  Help from another person to put on and taking off regular upper body clothing? 3  Help from another person to put on and taking off regular lower body clothing? 2  6 Click Score 16  OT Recommendation  Recommendations for Other Services PT consult  Follow Up Recommendations SNF;Supervision/Assistance - 24 hour  OT Equipment None recommended by OT  Individuals Consulted  Consulted and Agree with Results and Recommendations Patient  Acute Rehab OT Goals  Patient Stated Goal to stop having diarrhea  OT Goal Formulation With patient  Time For Goal Achievement 07/21/18  Potential to Achieve Goals Good  OT Time Calculation  OT Start Time (ACUTE ONLY) 1448  OT Stop Time (ACUTE ONLY) 1512  OT Time Calculation (min) 24 min  OT General Charges  $OT Visit 1 Visit  OT Evaluation  $OT Eval Moderate Complexity 1 Mod  OT Treatments  $Self Care/Home Management  8-22 mins  Written Expression  Dominant Hand Right  Jeani HawkingWendi Dyneisha Murchison, OTR/L Acute Rehabilitation Services Pager [phone] Office [phone]

## 2018-07-07 NOTE — Progress Notes (Signed)
CSW acknowledging consult for SNF placement. As Therapy evaluations are now in and recommending SNF, [facility] is able to initiate insurance authorization. Awaiting authorization before patient can admit to SNF.   CSW to follow.  Blenda NicelyElizabeth Solash Tullo, [place] Clinical Social Worker [phone]

## 2018-07-10 LAB — CULTURE, BLOOD (ROUTINE X 2)
Culture: NO GROWTH
Culture: NO GROWTH

## 2018-07-28 ENCOUNTER — Encounter (HOSPITAL_COMMUNITY): Payer: Self-pay

## 2018-07-28 ENCOUNTER — Emergency Department (HOSPITAL_COMMUNITY)
Admission: EM | Admit: 2018-07-28 | Discharge: 2018-07-28 | Disposition: A | Discharge status: Home / Self Care | Payer: Medicare Other | Attending: Emergency Medicine | Admitting: Emergency Medicine

## 2018-07-28 ENCOUNTER — Other Ambulatory Visit: Payer: Self-pay

## 2018-07-28 DIAGNOSIS — R531 Weakness: Secondary | ICD-10-CM | POA: Diagnosis not present

## 2018-07-28 LAB — BASIC METABOLIC PANEL
Anion gap: 10 (ref 5–15)
BUN: 7 mg/dL — ABNORMAL LOW (ref 8–23)
CO2: 21 mmol/L — ABNORMAL LOW (ref 22–32)
Calcium: 8.6 mg/dL — ABNORMAL LOW (ref 8.9–10.3)
Chloride: 100 mmol/L (ref 98–111)
Creatinine, Ser: 0.61 mg/dL (ref 0.44–1.00)
GFR calc Af Amer: >60 mL/min (ref >60)
GFR calc non Af Amer: >60 mL/min (ref >60)
Glucose, Bld: 132 mg/dL — ABNORMAL HIGH (ref 70–99)
Potassium: 4.8 mmol/L (ref 3.5–5.1)
Sodium: 131 mmol/L — ABNORMAL LOW (ref 135–145)

## 2018-07-28 LAB — CBC WITH DIFFERENTIAL/PLATELET
Abs Immature Granulocytes: 0.01 10*3/uL (ref 0.00–0.07)
Basophils Absolute: 0 10*3/uL (ref 0.0–0.1)
Basophils Relative: 0 %
Eosinophils Absolute: 0 10*3/uL (ref 0.0–0.5)
Eosinophils Relative: 0 %
HCT: 38.2 % (ref 36.0–46.0)
Hemoglobin: 12.6 g/dL (ref 12.0–15.0)
Immature Granulocytes: 0 %
Lymphocytes Relative: 41 %
Lymphs Abs: 1.9 10*3/uL (ref 0.7–4.0)
MCH: 29.2 pg (ref 26.0–34.0)
MCHC: 33 g/dL (ref 30.0–36.0)
MCV: 88.4 fL (ref 80.0–100.0)
Monocytes Absolute: 0.8 10*3/uL (ref 0.1–1.0)
Monocytes Relative: 17 %
Neutro Abs: 1.9 10*3/uL (ref 1.7–7.7)
Neutrophils Relative %: 42 %
Platelets: 182 10*3/uL (ref 150–400)
RBC: 4.32 MIL/uL (ref 3.87–5.11)
RDW: 13.2 % (ref 11.5–15.5)
WBC: 4.6 10*3/uL (ref 4.0–10.5)
nRBC: 0 % (ref 0.0–0.2)

## 2018-07-28 LAB — URINALYSIS, ROUTINE W REFLEX MICROSCOPIC
Bilirubin Urine: NEGATIVE
Glucose, UA: NEGATIVE mg/dL
Ketones, ur: NEGATIVE mg/dL
Leukocytes, UA: NEGATIVE
Nitrite: NEGATIVE
Protein, ur: 30 mg/dL — AB
Specific Gravity, Urine: 1.011 (ref 1.005–1.030)
pH: 6 (ref 5.0–8.0)

## 2018-07-28 MED ORDER — SODIUM CHLORIDE 0.9 % IV BOLUS
500.0000 mL | Freq: Once | INTRAVENOUS | Status: AC
  Administered 2018-07-28: 500 mL via INTRAVENOUS

## 2018-07-28 MED ORDER — LOPERAMIDE HCL 2 MG PO CAPS: 2.0000 mg | ORAL_CAPSULE | Freq: Four times a day (QID) | ORAL | Status: DC | PRN

## 2018-07-28 MED ORDER — GABAPENTIN 100 MG PO CAPS
100.0000 mg | ORAL_CAPSULE | Freq: Every day | ORAL | Status: DC
  Administered 2018-07-28: 200 mg via ORAL
  Filled 2018-07-28: qty 2

## 2018-07-28 MED ORDER — DOFETILIDE 250 MCG PO CAPS
250.0000 ug | ORAL_CAPSULE | Freq: Two times a day (BID) | ORAL | Status: DC
  Administered 2018-07-28: 250 ug via ORAL
  Filled 2018-07-28: qty 1

## 2018-07-28 MED ORDER — METHIMAZOLE 5 MG PO TABS
5.0000 mg | ORAL_TABLET | Freq: Every day | ORAL | Status: DC
  Administered 2018-07-28: 5 mg via ORAL
  Filled 2018-07-28: qty 1

## 2018-07-28 MED ORDER — APIXABAN 5 MG PO TABS
5.0000 mg | ORAL_TABLET | Freq: Two times a day (BID) | ORAL | Status: DC
  Administered 2018-07-28: 5 mg via ORAL
  Filled 2018-07-28: qty 1

## 2018-07-28 NOTE — Discharge Instructions (Signed)
Your labs today are reassuring. I do not have an obvious explanation for your weakness. If we were unable to obtain stool from you today then I would follow-up with your family doctor to discuss this.

## 2018-07-28 NOTE — ED Triage Notes (Signed)
Pt arrives [facility] from [facility] where pt c/o weakness, generalized. Pt had diarrhea over last several weeks and was seen christmas day with similar complaint and confusion. Intermittent confusion since with conntinued diarrhea. Pt report 3-5 times a day. Given 250 ns PTA

## 2018-07-28 NOTE — ED Notes (Signed)
Report given to [facility] RN. Pt brother states he is unable to take her home and is concerned that pt is returning to independent living situation. Concerns discussed with Dr. Charm Barges and then with CSW [name]. [name] state she will investigate level of care being provided and will report to [place], [place]

## 2018-07-28 NOTE — Care Management Note (Signed)
Case Management Note  Patient Details  Name: Crystal Hess MRN: [id number] Date of Birth: Oct 31, 1929  Subjective/Objective:                  weakness  Action/Plan: ED CMs [name] and [name]) spoke with the patient's nurse. CM ([name]) contacted [employer] to inquire on the patient's current level of care. Per the nurse, the patient is currently living in the Independent Living apartments. The nurse had the admissions coordinator for independent living, April Holding call ED CM. [name] states the patient was supposed to be discharged to their SNF on 07/07/18 but was picked up prior and returned home by family. [name] states she has contacted the admissions coordinator for SNF and they are able to admit the patient to their SNF tomorrow morning, 07/29/2018. She states the patient's family can contact her at [phone] or contact [name] (ext [phone]). The patient will stay with her brother [name] overnight at his home in [place]. He was offered a PTAR transport home but declined. He and the patient report she is able to ambulate with a rollator walker which she left in her apartment at [facility]. The patient was provided with a walker. ED CM provided the patient's brother with April Holding' telephone number. Patient's brother and the patient verbalize understanding of the plan for SNF admission tomorrow morning per the facility. [name] RN CCM   Expected Discharge Date:                  Expected Discharge Plan: to brother's home overnight, to SNF in the AM  In-House Referral:     Discharge planning Services  CM Consult  Post Acute Care Choice:    Choice offered to:     DME Arranged:    DME Agency:     HH Arranged:    HH Agency:     Status of Service:  Completed, signed off  If discussed at [employer] of Stay Meetings, dates discussed:    Additional Comments:  [name], RN 07/28/2018, 8:40 PM

## 2018-07-28 NOTE — ED Notes (Signed)
Case Management @ Bedside

## 2018-07-28 NOTE — ED Notes (Signed)
Patient verbalizes understanding of discharge instructions. Opportunity for questioning and answers were provided. Armband removed by staff, pt discharged from ED in wheelchair. Case management consulted assisted living to contact pt about SNF placement tomorrow.

## 2018-07-28 NOTE — ED Notes (Signed)
Pt again has equipment off. Reminded to keep arm extended to run fluids. Brother at bedsdie and stategs pt will remove BP cuff.

## 2018-07-28 NOTE — ED Provider Notes (Signed)
[facility] EMERGENCY DEPARTMENT Provider Note   CSN: [id number] Arrival date & time: 07/28/18  1359     History   Chief Complaint Chief Complaint  Patient presents with  . Weakness    HPI Crystal Hess is a 85 y.o. female.  HPI   85 year old female coming from [facility] with a chief complaint of generalized weakness.  Patient reports persistent diarrhea for the past several weeks.  She estimates 4-5 watery stools per day.  No blood or melena.  Denies any abdominal pain.  Also reported intermittent confusion. She has a hard time describing what exactly she means by this though.   Past Medical History:  Diagnosis Date  . Anxiety   . Arthritis    "knees, hands" (11/08/2014)  . Asthma   . Chronic lower back pain   . DJD (degenerative joint disease)   . DOE (dyspnea on exertion)    wears O2 at night but has not had a sleep study  . Dysrhythmia   . Family history of adverse reaction to anesthesia    "sister's heart stopped and had to be revived; think it was related to the demoral"  . Gout    "I've had it twice already this year" (11/08/2014)  . Hypertension   . Hyperthyroidism   . Mitral regurgitation    mild  . Obesity   . On home oxygen therapy    "2L just at night" (11/08/2014)  . Persistent atrial fibrillation   . Trace tricuspid regurgitation by prior echocardiogram   . Walking pneumonia ~ 1998    Patient Active Problem List   Diagnosis Date Noted  . Fever 07/06/2018  . TIA (transient ischemic attack) 07/06/2018  . Hyperthyroidism 07/06/2018  . Gout 07/06/2018  . S/P total knee arthroplasty 02/06/2015  . Visit for monitoring Tikosyn therapy 11/08/2014  . Persistent atrial fibrillation 10/10/2014  . Snoring 10/10/2014  . Morbid obesity (HCC) 10/10/2014  . Essential hypertension 10/10/2014  . Acute bronchitis 08/10/2012  . Syncope 12/07/2010  . ALLERGIC RHINITIS 05/04/2008  . Allergic-infective asthma 05/04/2008    Past  Surgical History:  Procedure Laterality Date  . CARDIOVERSION N/A 07/26/2014   Procedure: CARDIOVERSION;  Surgeon: Pamella Pert, MD;  Location: [facility] ENDOSCOPY;  Service: Cardiovascular;  Laterality: N/A;  . CARDIOVERSION N/A 08/30/2014   Procedure: CARDIOVERSION;  Surgeon: Pamella Pert, MD;  Location: [facility] ENDOSCOPY;  Service: Cardiovascular;  Laterality: N/A;  . CATARACT EXTRACTION W/ INTRAOCULAR LENS  IMPLANT, BILATERAL Bilateral ~ 2006  . EXCISIONAL HEMORRHOIDECTOMY  ~ 1960  . EYE SURGERY    . KNEE CARTILAGE SURGERY Right 1976   "cut it open"  . OVARIAN CYST REMOVAL Right 1963   "gangrene"  . PACEMAKER IMPLANT  05/11/2017   Medtronic MRI SureScan  . SHOULDER ARTHROSCOPY Left ~ 2008   bone spur  . TONSILLECTOMY    . TOTAL KNEE ARTHROPLASTY Right 02/06/2015   Procedure: RIGHT TOTAL KNEE ARTHROPLASTY;  Surgeon: Dannielle Huh, MD;  Location: [facility] OR;  Service: Orthopedics;  Laterality: Right;     OB History   No obstetric history on file.      Home Medications    Prior to Admission medications   Medication Sig Start Date End Date Taking? Authorizing Provider  allopurinol (ZYLOPRIM) 300 MG tablet Take 300 mg by mouth daily.  01/10/15   [provider]  colchicine 0.6 MG tablet Take 1.2 mg by mouth daily as needed (for gout).    [provider]  dofetilide (TIKOSYN) 250 MCG capsule Take 1 capsule by mouth 2 (two) times daily. 04/29/18   [provider]  ELIQUIS 5 MG TABS tablet Take 5 mg by mouth 2 (two) times daily. 08/12/14   [provider]  furosemide (LASIX) 20 MG tablet Take 1 tablet by mouth every other day. 04/20/18   [provider]  gabapentin (NEURONTIN) 100 MG capsule Take 100-200 mg by mouth at bedtime.    [provider]  irbesartan (AVAPRO) 300 MG tablet Take 300 mg by mouth daily.    [provider]  leflunomide (ARAVA) 20 MG tablet Take 20 mg by mouth daily.    [provider]  loperamide (IMODIUM  A-D) 2 MG tablet Take 1 tablet (2 mg total) by mouth 4 (four) times daily as needed for diarrhea or loose stools. 07/07/18   Sherryll BurgerShah, Pratik D, DO  loteprednol (LOTEMAX) 0.5 % ophthalmic suspension Place 1 drop into both eyes 2 (two) times daily.    [provider]  MAGNESIUM-OXIDE 400 (241.3 Mg) MG tablet TAKE 1 TABLET(400 MG) BY MOUTH DAILY Patient taking differently: Take 400 mg by mouth daily.  02/24/17   Duke SalviaKlein, Steven C, MD  methimazole (TAPAZOLE) 5 MG tablet Take 5 mg by mouth at bedtime.  06/13/14   [provider]  metoprolol tartrate (LOPRESSOR) 25 MG tablet Take 12.5 mg by mouth daily.    [provider]  potassium chloride SA (K-DUR,KLOR-CON) 20 MEQ tablet Take 20 mEq by mouth every other day.  04/20/18   [provider]  PROVENTIL HFA 108 (90 BASE) MCG/ACT inhaler INHALE 2 PUFFS INTO LUNGS EVERY 6 HOURS AS NEEDED FOR WHEEZING AND SHORTNESS OF BREATH Patient taking differently: Inhale 2 puffs into the lungs every 6 (six) hours as needed for wheezing.  11/05/12   Waymon BudgeYoung, Clinton D, MD  traMADol (ULTRAM) 50 MG tablet Take 50 mg by mouth every 8 (eight) hours as needed for moderate pain.    [provider]    Family History Family History  Problem Relation Age of Onset  . Lymphoma Mother   . Lung cancer Father        smoker  . Emphysema Brother     Social History Social History   Tobacco Use  . Smoking status: Never Smoker  . Smokeless tobacco: Never Used  Substance Use Topics  . Alcohol use: No  . Drug use: No     Allergies   Meperidine and related; Other; Codeine; Methotrexate derivatives; and Vasculera [nutritional supplements]   Review of Systems Review of Systems All systems reviewed and negative, other than as noted in HPI.   Physical Exam Updated Vital Signs BP (!) 164/77 (BP Location: Right Arm)   Pulse 97   Temp 98.1 F (36.7 C) (Oral)   Resp 16   Ht 5\' 3"  (1.6 m)   Wt 72.1 kg   SpO2 98%   BMI 28.17 kg/m    Physical Exam Vitals signs and nursing note reviewed.  Constitutional:      General: She is not in acute distress.    Appearance: She is well-developed.  HENT:     Head: Normocephalic and atraumatic.  Eyes:     General:        Right eye: No discharge.        Left eye: No discharge.     Conjunctiva/sclera: Conjunctivae normal.  Neck:     Musculoskeletal: Neck supple.  Cardiovascular:     Rate and Rhythm: Normal rate  and regular rhythm.     Heart sounds: Normal heart sounds. No murmur. No friction rub. No gallop.   Pulmonary:     Effort: Pulmonary effort is normal. No respiratory distress.     Breath sounds: Normal breath sounds.  Abdominal:     General: There is no distension.     Palpations: Abdomen is soft.     Tenderness: There is no abdominal tenderness.  Musculoskeletal:        General: No tenderness.  Skin:    General: Skin is warm and dry.  Neurological:     Mental Status: She is alert.  Psychiatric:        Behavior: Behavior normal.        Thought Content: Thought content normal.      ED Treatments / Results  Labs (all labs ordered are listed, but only abnormal results are displayed) Labs Reviewed  BASIC METABOLIC PANEL - Abnormal; Notable for the following components:      Result Value   Sodium 131 (*)    CO2 21 (*)    Glucose, Bld 132 (*)    BUN 7 (*)    Calcium 8.6 (*)    All other components within normal limits  URINALYSIS, ROUTINE W REFLEX MICROSCOPIC - Abnormal; Notable for the following components:   Hgb urine dipstick SMALL (*)    Protein, ur 30 (*)    Bacteria, UA RARE (*)    All other components within normal limits  GASTROINTESTINAL PANEL BY PCR, STOOL (REPLACES STOOL CULTURE)  CBC WITH DIFFERENTIAL/PLATELET    EKG None  Radiology No results found.  Procedures Procedures (including critical care time)  Medications Ordered in ED Medications - No data to display   Initial Impression / Assessment and Plan / ED Course  I have  reviewed the triage vital signs and the nursing notes.  Pertinent labs & imaging results that were available during my care of the patient were reviewed by me and considered in my medical decision making (see chart for details).  Clinical Course as of Aug 03 1038  Tue Jul 28, 2018  1754 Patient urinalysis does not show any  overt infection.  We will proceed with plan to discharge back to facility.   [MB]  1953 His brother is here now and states she can go back to her facility because she is supposed to be independent there and she can manage that.  I recommended that we get case management involved and initially he did not want to and wanted to take her home but now is agreeable and we have asked him to evaluate to see if her living situation can be modified.   [MB]  2040 Case management is spoke with the brother and he is willing to take the patient back to his place tonight.  The facility sounds like they have the ability to increase her services up to assisted living level and has been contacted by case management and they plan to be able to get her back to her place tomorrow.   [MB]    Clinical Course User Index [MB] Terrilee Files, MD    85 year old female with generalized weakness.  Just difficulty given clear timeline.  Get the impression that this is been progressive for the past at least 2 days to possibly weeks.  Intermittent diarrhea.  She is afebrile and hemodynamically stable here.  I doubt emergent process.  Will medically screen.  Stool studies. Final Clinical Impressions(s) / ED Diagnoses  Final diagnoses:  Generalized weakness    ED Discharge Orders    None       Raeford Razor, MD 08/03/18 1043

## 2018-07-28 NOTE — ED Notes (Signed)
Pt is aware of need for urine and stool specimen.  

## 2018-07-29 NOTE — ED Provider Notes (Signed)
Clinical Course as of Jul 30 1307  Tue Jul 28, 2018  1754 Patient urinalysis does not show any  overt infection.  We will proceed with plan to discharge back to facility.   [MB]  1953 His brother is here now and states she can go back to her facility because she is supposed to be independent there and she can manage that.  I recommended that we get case management involved and initially he did not want to and wanted to take her home but now is agreeable and we have asked him to evaluate to see if her living situation can be modified.   [MB]  2040 Case management is spoke with the brother and he is willing to take the patient back to his place tonight.  The facility sounds like they have the ability to increase her services up to assisted living level and has been contacted by case management and they plan to be able to get her back to her place tomorrow.   [MB]    Clinical Course User Index [MB] Terrilee FilesButler, Michael C, MD   Pateint signed out to me from Dr Juleen ChinaKohut. Patient with diarrhea, increased confusion. Plan is to followup on UA and discharge back to home.    Terrilee FilesButler, Michael C, MD 07/29/18 [phone]

## 2018-08-04 ENCOUNTER — Other Ambulatory Visit: Payer: Self-pay | Admitting: Family Medicine

## 2018-08-04 DIAGNOSIS — R4182 Altered mental status, unspecified: Secondary | ICD-10-CM

## 2018-08-05 ENCOUNTER — Ambulatory Visit
Admission: RE | Admit: 2018-08-05 | Discharge: 2018-08-05 | Disposition: A | Discharge status: Home / Self Care | Payer: Medicare Other | Source: Ambulatory Visit | Attending: Family Medicine | Admitting: Family Medicine

## 2018-08-05 ENCOUNTER — Other Ambulatory Visit: Payer: Medicare Other

## 2018-08-05 ENCOUNTER — Encounter: Payer: Self-pay | Admitting: Neurology

## 2018-08-05 DIAGNOSIS — R4182 Altered mental status, unspecified: Secondary | ICD-10-CM

## 2018-08-13 ENCOUNTER — Other Ambulatory Visit: Payer: Self-pay | Admitting: Family Medicine

## 2018-08-13 DIAGNOSIS — R4182 Altered mental status, unspecified: Secondary | ICD-10-CM

## 2018-08-17 ENCOUNTER — Encounter: Payer: Self-pay | Admitting: Cardiology

## 2018-08-26 ENCOUNTER — Inpatient Hospital Stay (HOSPITAL_COMMUNITY)
Admission: EM | Admit: 2018-08-26 | Discharge: 2018-09-06 | DRG: 086 | Disposition: E | Payer: Medicare Other | Source: Skilled Nursing Facility | Attending: Internal Medicine | Admitting: Internal Medicine

## 2018-08-26 ENCOUNTER — Emergency Department (HOSPITAL_COMMUNITY): Payer: Medicare Other

## 2018-08-26 ENCOUNTER — Other Ambulatory Visit: Payer: Self-pay

## 2018-08-26 ENCOUNTER — Encounter (HOSPITAL_COMMUNITY): Payer: Self-pay

## 2018-08-26 DIAGNOSIS — Z950 Presence of cardiac pacemaker: Secondary | ICD-10-CM

## 2018-08-26 DIAGNOSIS — S065X9A Traumatic subdural hemorrhage with loss of consciousness of unspecified duration, initial encounter: Secondary | ICD-10-CM | POA: Diagnosis not present

## 2018-08-26 DIAGNOSIS — S065XAA Traumatic subdural hemorrhage with loss of consciousness status unknown, initial encounter: Secondary | ICD-10-CM

## 2018-08-26 DIAGNOSIS — R4701 Aphasia: Secondary | ICD-10-CM | POA: Diagnosis present

## 2018-08-26 DIAGNOSIS — Z79899 Other long term (current) drug therapy: Secondary | ICD-10-CM

## 2018-08-26 DIAGNOSIS — Z888 Allergy status to other drugs, medicaments and biological substances status: Secondary | ICD-10-CM

## 2018-08-26 DIAGNOSIS — R64 Cachexia: Secondary | ICD-10-CM | POA: Diagnosis present

## 2018-08-26 DIAGNOSIS — Z6828 Body mass index (BMI) 28.0-28.9, adult: Secondary | ICD-10-CM

## 2018-08-26 DIAGNOSIS — I10 Essential (primary) hypertension: Secondary | ICD-10-CM | POA: Diagnosis present

## 2018-08-26 DIAGNOSIS — W06XXXA Fall from bed, initial encounter: Secondary | ICD-10-CM | POA: Diagnosis present

## 2018-08-26 DIAGNOSIS — Y92122 Bedroom in nursing home as the place of occurrence of the external cause: Secondary | ICD-10-CM

## 2018-08-26 DIAGNOSIS — S065X0A Traumatic subdural hemorrhage without loss of consciousness, initial encounter: Principal | ICD-10-CM | POA: Diagnosis present

## 2018-08-26 DIAGNOSIS — E0590 Thyrotoxicosis, unspecified without thyrotoxic crisis or storm: Secondary | ICD-10-CM | POA: Diagnosis present

## 2018-08-26 DIAGNOSIS — Z66 Do not resuscitate: Secondary | ICD-10-CM | POA: Diagnosis not present

## 2018-08-26 DIAGNOSIS — Z8673 Personal history of transient ischemic attack (TIA), and cerebral infarction without residual deficits: Secondary | ICD-10-CM

## 2018-08-26 DIAGNOSIS — M545 Low back pain: Secondary | ICD-10-CM | POA: Diagnosis present

## 2018-08-26 DIAGNOSIS — Z885 Allergy status to narcotic agent status: Secondary | ICD-10-CM

## 2018-08-26 DIAGNOSIS — R410 Disorientation, unspecified: Secondary | ICD-10-CM | POA: Diagnosis not present

## 2018-08-26 DIAGNOSIS — G8929 Other chronic pain: Secondary | ICD-10-CM | POA: Diagnosis present

## 2018-08-26 DIAGNOSIS — I4819 Other persistent atrial fibrillation: Secondary | ICD-10-CM | POA: Diagnosis not present

## 2018-08-26 DIAGNOSIS — Z515 Encounter for palliative care: Secondary | ICD-10-CM

## 2018-08-26 DIAGNOSIS — G8191 Hemiplegia, unspecified affecting right dominant side: Secondary | ICD-10-CM | POA: Diagnosis present

## 2018-08-26 DIAGNOSIS — M109 Gout, unspecified: Secondary | ICD-10-CM | POA: Diagnosis present

## 2018-08-26 DIAGNOSIS — J45909 Unspecified asthma, uncomplicated: Secondary | ICD-10-CM | POA: Diagnosis present

## 2018-08-26 DIAGNOSIS — Z79891 Long term (current) use of opiate analgesic: Secondary | ICD-10-CM

## 2018-08-26 DIAGNOSIS — E440 Moderate protein-calorie malnutrition: Secondary | ICD-10-CM | POA: Diagnosis present

## 2018-08-26 DIAGNOSIS — Z7189 Other specified counseling: Secondary | ICD-10-CM

## 2018-08-26 DIAGNOSIS — Z7901 Long term (current) use of anticoagulants: Secondary | ICD-10-CM

## 2018-08-26 DIAGNOSIS — I081 Rheumatic disorders of both mitral and tricuspid valves: Secondary | ICD-10-CM | POA: Diagnosis present

## 2018-08-26 HISTORY — DX: Prediabetes: R73.03

## 2018-08-26 LAB — DIFFERENTIAL
Abs Immature Granulocytes: 0.22 10*3/uL — ABNORMAL HIGH (ref 0.00–0.07)
BASOS ABS: 0.1 10*3/uL (ref 0.0–0.1)
BASOS PCT: 1 %
EOS ABS: 0.2 10*3/uL (ref 0.0–0.5)
Eosinophils Relative: 2 %
Immature Granulocytes: 3 %
Lymphocytes Relative: 32 %
Lymphs Abs: 2.8 10*3/uL (ref 0.7–4.0)
Monocytes Absolute: 0.8 10*3/uL (ref 0.1–1.0)
Monocytes Relative: 9 %
NEUTROS PCT: 53 %
Neutro Abs: 4.9 10*3/uL (ref 1.7–7.7)

## 2018-08-26 LAB — CBC
HCT: 37.2 % (ref 36.0–46.0)
Hemoglobin: 12 g/dL (ref 12.0–15.0)
MCH: 27.8 pg (ref 26.0–34.0)
MCHC: 32.3 g/dL (ref 30.0–36.0)
MCV: 86.3 fL (ref 80.0–100.0)
Platelets: 318 10*3/uL (ref 150–400)
RBC: 4.31 MIL/uL (ref 3.87–5.11)
RDW: 14.2 % (ref 11.5–15.5)
WBC: 9 10*3/uL (ref 4.0–10.5)
nRBC: 0 % (ref 0.0–0.2)

## 2018-08-26 LAB — COMPREHENSIVE METABOLIC PANEL
ALT: 20 U/L (ref 0–44)
AST: 26 U/L (ref 15–41)
Albumin: 2.8 g/dL — ABNORMAL LOW (ref 3.5–5.0)
Alkaline Phosphatase: 69 U/L (ref 38–126)
Anion gap: 15 (ref 5–15)
BUN: 12 mg/dL (ref 8–23)
CHLORIDE: 99 mmol/L (ref 98–111)
CO2: 16 mmol/L — ABNORMAL LOW (ref 22–32)
Calcium: 9.1 mg/dL (ref 8.9–10.3)
Creatinine, Ser: 0.64 mg/dL (ref 0.44–1.00)
GFR calc Af Amer: >60 mL/min (ref >60)
GFR calc non Af Amer: >60 mL/min (ref >60)
Glucose, Bld: 242 mg/dL — ABNORMAL HIGH (ref 70–99)
Potassium: 3.9 mmol/L (ref 3.5–5.1)
Sodium: 130 mmol/L — ABNORMAL LOW (ref 135–145)
Total Bilirubin: 0.8 mg/dL (ref 0.3–1.2)
Total Protein: 7.1 g/dL (ref 6.5–8.1)

## 2018-08-26 LAB — I-STAT CREATININE, ED: CREATININE: 0.4 mg/dL — AB (ref 0.44–1.00)

## 2018-08-26 LAB — PROTIME-INR
INR: 1.35
Prothrombin Time: 16.5 seconds — ABNORMAL HIGH (ref 11.4–15.2)

## 2018-08-26 LAB — CBG MONITORING, ED: Glucose-Capillary: 227 mg/dL — ABNORMAL HIGH (ref 70–99)

## 2018-08-26 LAB — MRSA PCR SCREENING: MRSA BY PCR: NEGATIVE

## 2018-08-26 LAB — APTT: APTT: 30 s (ref 24–36)

## 2018-08-26 MED ORDER — MORPHINE SULFATE (CONCENTRATE) 10 MG/0.5ML PO SOLN: 5.0000 mg | ORAL | Status: DC | PRN

## 2018-08-26 MED ORDER — LORAZEPAM 1 MG PO TABS: 1.0000 mg | ORAL_TABLET | ORAL | Status: DC | PRN

## 2018-08-26 MED ORDER — MAGIC MOUTHWASH
15.0000 mL | Freq: Four times a day (QID) | ORAL | Status: DC | PRN
  Filled 2018-08-26: qty 15

## 2018-08-26 MED ORDER — GLYCOPYRROLATE 0.2 MG/ML IJ SOLN: 0.2000 mg | INTRAMUSCULAR | Status: DC | PRN

## 2018-08-26 MED ORDER — MORPHINE SULFATE (PF) 2 MG/ML IV SOLN
1.0000 mg | INTRAVENOUS | Status: DC | PRN
  Administered 2018-08-26 (×2): 2 mg via INTRAVENOUS
  Administered 2018-08-27 (×2): 1 mg via INTRAVENOUS
  Filled 2018-08-26 (×4): qty 1

## 2018-08-26 MED ORDER — SODIUM CHLORIDE 0.9% FLUSH
3.0000 mL | Freq: Once | INTRAVENOUS | Status: AC
  Administered 2018-08-26: 3 mL via INTRAVENOUS

## 2018-08-26 MED ORDER — GLYCOPYRROLATE 0.2 MG/ML IJ SOLN
0.3000 mg | INTRAMUSCULAR | Status: DC | PRN
  Administered 2018-08-26 – 2018-08-28 (×2): 0.3 mg via INTRAVENOUS
  Filled 2018-08-26 (×3): qty 2

## 2018-08-26 MED ORDER — LORAZEPAM 2 MG/ML IJ SOLN
1.0000 mg | INTRAMUSCULAR | Status: DC | PRN
  Administered 2018-08-26 – 2018-08-27 (×4): 1 mg via INTRAVENOUS
  Filled 2018-08-26 (×4): qty 1

## 2018-08-26 MED ORDER — ONDANSETRON HCL 4 MG/2ML IJ SOLN
4.0000 mg | Freq: Four times a day (QID) | INTRAMUSCULAR | Status: DC | PRN
  Administered 2018-08-26: 4 mg via INTRAVENOUS
  Filled 2018-08-26: qty 2

## 2018-08-26 MED ORDER — ACETAMINOPHEN 325 MG PO TABS: 650.0000 mg | ORAL_TABLET | Freq: Four times a day (QID) | ORAL | Status: DC | PRN

## 2018-08-26 MED ORDER — ACETAMINOPHEN 650 MG RE SUPP: 650.0000 mg | Freq: Four times a day (QID) | RECTAL | Status: DC | PRN

## 2018-08-26 MED ORDER — POLYVINYL ALCOHOL 1.4 % OP SOLN
1.0000 [drp] | Freq: Four times a day (QID) | OPHTHALMIC | Status: DC | PRN
  Filled 2018-08-26 (×2): qty 15

## 2018-08-26 MED ORDER — MORPHINE SULFATE (PF) 2 MG/ML IV SOLN: 1.0000 mg | INTRAVENOUS | Status: DC | PRN

## 2018-08-26 MED ORDER — BIOTENE DRY MOUTH MT LIQD: 15.0000 mL | OROMUCOSAL | Status: DC | PRN

## 2018-08-26 MED ORDER — HALOPERIDOL LACTATE 5 MG/ML IJ SOLN: 0.5000 mg | INTRAMUSCULAR | Status: DC | PRN

## 2018-08-26 MED ORDER — HALOPERIDOL LACTATE 2 MG/ML PO CONC
0.5000 mg | ORAL | Status: DC | PRN
  Filled 2018-08-26: qty 0.3

## 2018-08-26 MED ORDER — CLEVIDIPINE BUTYRATE 0.5 MG/ML IV EMUL
0.0000 mg/h | INTRAVENOUS | Status: DC
  Administered 2018-08-26: 1 mg/h via INTRAVENOUS

## 2018-08-26 MED ORDER — LORAZEPAM 2 MG/ML PO CONC: 1.0000 mg | ORAL | Status: DC | PRN

## 2018-08-26 MED ORDER — HALOPERIDOL 1 MG PO TABS: 0.5000 mg | ORAL_TABLET | ORAL | Status: DC | PRN

## 2018-08-26 MED ORDER — GLYCOPYRROLATE 1 MG PO TABS
1.0000 mg | ORAL_TABLET | ORAL | Status: DC | PRN
  Filled 2018-08-26: qty 1

## 2018-08-26 MED ORDER — DIPHENHYDRAMINE HCL 50 MG/ML IJ SOLN
12.5000 mg | INTRAMUSCULAR | Status: DC | PRN
  Filled 2018-08-26: qty 1

## 2018-08-26 NOTE — Progress Notes (Signed)
Pt has an order for pt pacemaker to be turned off; [employer] representative notified and per Jana Half: According to the company policy, they don't turn off pacemakers; only ICD but; she can guide physician on how to turn it off if they need pacemaker off when they call her. Dionne Bucy RN

## 2018-08-26 NOTE — ED Triage Notes (Signed)
85 yo female coming from [facility] facility with reports of being found down this morning secondary to a fall. Pt was last seen last night at 2145. This morning, she was found at 6:45 in the floor. EMS was called and brought to the ED. Upon assessment, EDP noted right sided weakness and aphasia. Code Stroke activated. Stroke Team met her in the CT. Initial NIHSS 17 due to inability to speak or follow commands, right arm weakness, bilateral leg weakness, right facial droop, aphasia, and dysarthria. See flowsheet for complete NIHSS exam and Stroke Timeline. CT Head showed traumatic SDH. Pt's BP noted to be 189/104 and Cleviprex started. Titrated per protocol. Neurosurgery consulted and requested SBP <160. No reversal at this time due to potential surgery intervention - a contraindication for starting Andexxa. Pt's stable at this time and BP within ordered parameters. Pt awaiting neurosurgery for consult. Handoff given to [employer], Therapist, sports.

## 2018-08-26 NOTE — ED Triage Notes (Signed)
Pt in via [facility], per report pt had unwitnessed fall to floor and was found this am @ 6:45, pt presents to ED with reported n/v, swelling to posterior head, pt took Eluqius last night at 9:45 per [facility], pt has audible rhonchi, pt has R sided neglect on exam, Code Stroke activated

## 2018-08-26 NOTE — Progress Notes (Signed)
Pt admitted to the unit from ED; pt alert but non-verbal; pt vomited on bed when transferred from stretcher to bed upon arrival the room; pt bathed and cleaned; per family pt allergic to polyester and charge RN notified for cotton sheets and gown. Pt skin dry and intact with no pressure ulcers or opened wounds noted; VSS; palliative NP notified of emesis and ordered to give pt prns including her ativan. meds given and pt resting comfortably in bed with call light within reach; bed alarm on and family remain at bedside. Suctioning set up in room. Will closely monitor and keep pt comfortable. PLeotis Pain Maxene Byington RN   08/10/2018 1500  Vitals  Temp 98 F (36.7 C)  Temp Source Tympanic  BP (!) 167/91  BP Location Right Arm  BP Method Automatic  Patient Position (if appropriate) Lying  Pulse Rate 88  Pulse Rate Source Dinamap  Resp (!) 22  Oxygen Therapy  SpO2 98 %  O2 Device Room Air  Pain Assessment  Pain Scale PAINAD  PAINAD (Pain Assessment in Advanced Dementia)  Breathing 1  Negative Vocalization 0  Facial Expression 0  Body Language 1  Consolability 1  PAINAD Score 3  MEWS Score  MEWS RR 1  MEWS Pulse 0  MEWS Systolic 0  MEWS LOC 0  MEWS Temp 0  MEWS Score 1  MEWS Score Color Comfort Care Only

## 2018-08-26 NOTE — ED Provider Notes (Signed)
[facility] EMERGENCY DEPARTMENT Provider Note   CSN: [id number] Arrival date & time: 08/19/2018  4098  An emergency department physician performed an initial assessment on this suspected stroke patient at 0825.  History   Chief Complaint Chief Complaint  Patient presents with  . Code Stroke    HPI Crystal Hess is a 85 y.o. female.     85 yo F with a chief complaint of a fall.  The patient fell this morning and nursing staff when I try to get her back into bed realized that she was confused.  She was having some persistent vomiting as well.  Patient is nonverbal on arrival level 5 caveat.  The history is provided by the patient.  Illness  Severity:  Severe Onset quality:  Sudden Duration:  1 hour Timing:  Constant Progression:  Unchanged Chronicity:  New   Past Medical History:  Diagnosis Date  . Anxiety   . Arthritis    "knees, hands" (11/08/2014)  . Asthma   . Chronic lower back pain   . DJD (degenerative joint disease)   . DOE (dyspnea on exertion)    wears O2 at night but has not had a sleep study  . Dysrhythmia   . Family history of adverse reaction to anesthesia    "sister's heart stopped and had to be revived; think it was related to the demoral"  . Gout    "I've had it twice already this year" (11/08/2014)  . Hypertension   . Hyperthyroidism   . Mitral regurgitation    mild  . Obesity   . On home oxygen therapy    "2L just at night" (11/08/2014)  . Persistent atrial fibrillation   . Pre-diabetes    family states that patient is borderline  . Trace tricuspid regurgitation by prior echocardiogram   . Walking pneumonia ~ 1998    Patient Active Problem List   Diagnosis Date Noted  . Subdural hematoma (HCC) 09/03/2018  . Fever 07/06/2018  . TIA (transient ischemic attack) 07/06/2018  . Hyperthyroidism 07/06/2018  . Gout 07/06/2018  . S/P total knee arthroplasty 02/06/2015  . Visit for monitoring Tikosyn therapy 11/08/2014  .  Persistent atrial fibrillation 10/10/2014  . Snoring 10/10/2014  . Morbid obesity (HCC) 10/10/2014  . Essential hypertension 10/10/2014  . Acute bronchitis 08/10/2012  . Syncope 12/07/2010  . ALLERGIC RHINITIS 05/04/2008  . 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 CARDIOVERSION N/A 07/26/2014   Procedure: CARDIOVERSION;  Surgeon: Pamella Pert, MD;  Location: [facility] ENDOSCOPY;  Service: Cardiovascular;  Laterality: N/A;  . CARDIOVERSION N/A 08/30/2014   Procedure: CARDIOVERSION;  Surgeon: Pamella Pert, MD;  Location: [facility] ENDOSCOPY;  Service: Cardiovascular;  Laterality: N/A;  . CATARACT EXTRACTION W/ INTRAOCULAR LENS  IMPLANT, BILATERAL Bilateral ~ 2006  . EXCISIONAL HEMORRHOIDECTOMY  ~ 1960  . EYE SURGERY    . KNEE CARTILAGE SURGERY Right 1976   "cut it open"  . OVARIAN CYST REMOVAL Right 1963   "gangrene"  . PACEMAKER IMPLANT  05/11/2017   Medtronic MRI SureScan  . SHOULDER ARTHROSCOPY Left ~ 2008   bone spur  . TONSILLECTOMY    . TOTAL KNEE ARTHROPLASTY Right 02/06/2015   Procedure: RIGHT TOTAL KNEE ARTHROPLASTY;  Surgeon: Dannielle Huh, MD;  Location: [facility] OR;  Service: Orthopedics;  Laterality: Right;     OB History   No obstetric history on file.      Home Medications  Prior to Admission medications   Medication Sig Start Date End Date Taking? Authorizing Provider  allopurinol (ZYLOPRIM) 300 MG tablet Take 300 mg by mouth daily.  01/10/15  Yes [provider]  colchicine 0.6 MG tablet Take 1.2 mg by mouth daily as needed (for gout).   Yes [provider]  dofetilide (TIKOSYN) 250 MCG capsule Take 250 mcg by mouth 2 (two) times daily.  04/29/18  Yes [provider]  ELIQUIS 5 MG TABS tablet Take 5 mg by mouth 2 (two) times daily. 08/12/14  Yes [provider]  furosemide (LASIX) 20 MG tablet Take 20 mg by mouth every other day.  04/20/18  Yes [provider]  gabapentin  (NEURONTIN) 100 MG capsule Take 100 mg by mouth at bedtime.    Yes [provider]  irbesartan (AVAPRO) 300 MG tablet Take 300 mg by mouth daily.   Yes [provider]  leflunomide (ARAVA) 20 MG tablet Take 20 mg by mouth daily.   Yes [provider]  loteprednol (LOTEMAX) 0.5 % ophthalmic suspension Place 1 drop into both eyes 2 (two) times daily.   Yes [provider]  MAGNESIUM-OXIDE 400 (241.3 Mg) MG tablet TAKE 1 TABLET(400 MG) BY MOUTH DAILY Patient taking differently: Take 400 mg by mouth daily.  02/24/17  Yes Duke Salvia, MD  methimazole (TAPAZOLE) 5 MG tablet Take 5 mg by mouth at bedtime.  06/13/14  Yes [provider]  metoprolol succinate (TOPROL-XL) 25 MG 24 hr tablet Take 12.5 mg by mouth daily. Hold if SBP <100 or HR < 60   Yes [provider]  potassium chloride SA (K-DUR,KLOR-CON) 20 MEQ tablet Take 20 mEq by mouth daily.  04/20/18  Yes [provider]  Probiotic Product (RISA-BID PROBIOTIC) TABS Take 1 tablet by mouth daily. 250 mg   Yes [provider]  PROVENTIL HFA 108 (90 BASE) MCG/ACT inhaler INHALE 2 PUFFS INTO LUNGS EVERY 6 HOURS AS NEEDED FOR WHEEZING AND SHORTNESS OF BREATH Patient taking differently: Inhale 2 puffs into the lungs every 6 (six) hours as needed for wheezing.  11/05/12  Yes Young, Joni Fears D, MD  sertraline (ZOLOFT) 25 MG tablet Take 25 mg by mouth daily.   Yes [provider]  traMADol (ULTRAM) 50 MG tablet Take 50 mg by mouth every 8 (eight) hours as needed for moderate pain.   Yes [provider]  loperamide (IMODIUM A-D) 2 MG tablet Take 1 tablet (2 mg total) by mouth 4 (four) times daily as needed for diarrhea or loose stools. Patient not taking: Reported on 09-11-2018 07/07/18   Maurilio Lovely D, DO    Family History Family History  Problem Relation Age of Onset  . Lymphoma Mother   . Lung cancer Father        smoker  . Emphysema Brother     Social  History Social History   Tobacco Use  . Smoking status: Never Smoker  . Smokeless tobacco: Never Used  Substance Use Topics  . Alcohol use: No  . Drug use: No     Allergies   Meperidine and related; Other; Codeine; Methotrexate derivatives; and Vasculera [nutritional supplements]   Review of Systems Review of Systems  Unable to perform ROS: Patient nonverbal     Physical Exam Updated Vital Signs BP (!) 167/91 (BP Location: Right Arm)   Pulse 88   Temp 98 F (36.7 C) (Tympanic)   Resp (!) 22   SpO2 98%   Physical Exam  Vitals signs and nursing note reviewed.  Constitutional:      General: She is not in acute distress.    Appearance: She is well-developed. She is not diaphoretic.  HENT:     Head: Normocephalic.     Comments: Left occipital hematoma Eyes:     Pupils: Pupils are equal, round, and reactive to light.  Neck:     Musculoskeletal: Normal range of motion and neck supple.  Cardiovascular:     Rate and Rhythm: Normal rate and regular rhythm.     Heart sounds: No murmur. No friction rub. No gallop.   Pulmonary:     Effort: Pulmonary effort is normal.     Breath sounds: No wheezing or rales.  Abdominal:     General: There is no distension.     Palpations: Abdomen is soft.     Tenderness: There is no abdominal tenderness.  Musculoskeletal:        General: No tenderness.  Skin:    General: Skin is warm and dry.  Neurological:     Mental Status: She is alert.     Comments: Incomprehensible sounds patient will track you on the left eyes do not cross midline to the right for me.  Right-sided neglect.  Will move her right lower extremity spontaneously.  Psychiatric:        Behavior: Behavior normal.      ED Treatments / Results  Labs (all labs ordered are listed, but only abnormal results are displayed) Labs Reviewed  PROTIME-INR - Abnormal; Notable for the following components:      Result Value   Prothrombin Time 16.5 (*)    All other components  within normal limits  DIFFERENTIAL - Abnormal; Notable for the following components:   Abs Immature Granulocytes 0.22 (*)    All other components within normal limits  COMPREHENSIVE METABOLIC PANEL - Abnormal; Notable for the following components:   Sodium 130 (*)    CO2 16 (*)    Glucose, Bld 242 (*)    Albumin 2.8 (*)    All other components within normal limits  CBG MONITORING, ED - Abnormal; Notable for the following components:   Glucose-Capillary 227 (*)    All other components within normal limits  I-STAT CREATININE, ED - Abnormal; Notable for the following components:   Creatinine, Ser 0.40 (*)    All other components within normal limits  MRSA PCR SCREENING  APTT  CBC    EKG EKG Interpretation  Date/Time:  Wednesday 31-Aug-2018 08:25:46 EST Ventricular Rate:  116 PR Interval:    QRS Duration: 97 QT Interval:  341 QTC Calculation: 455 R Axis:   49 Text Interpretation:  Atrial fibrillation Paired ventricular premature complexes Aberrant conduction of SV complex(es) Lateral infarct, acute (LAD) Baseline wander in lead(s) V1 No significant change since last tracing Confirmed by Melene Plan [phone]) on 08-31-2018 8:35:19 AM Also confirmed by Melene Plan [phone]), editor Barbette Hair [phone])  on Aug 31, 2018 10:13:49 AM   Radiology Ct Cervical Spine Wo Contrast  Result Date: 08-31-18 CLINICAL DATA:  Fall EXAM: CT CERVICAL SPINE WITHOUT CONTRAST TECHNIQUE: Multidetector CT imaging of the cervical spine was performed without intravenous contrast. Multiplanar CT image reconstructions were also generated. COMPARISON:  None. FINDINGS: Alignment: Normal. Skull base and vertebrae: No acute fracture. No primary bone lesion or focal pathologic process. Soft tissues and spinal canal: No prevertebral fluid or swelling. No visible canal hematoma. Disc levels: There is severe DISH and near complete ankylosis of the cervical  spine. Upper chest: Negative. Other: Large, partially imaged  nodule of the left lobe of the thyroid, which deflects the trachea. IMPRESSION: 1. No fracture or static subluxation of the cervical spine. Severe DISH and near-total ankylosis of the cervical spine. 2. Large, partially imaged nodule of the left lobe of the thyroid, which deflects the trachea. Electronically Signed   By: Lauralyn Primes M.D.   On: 09-12-2018 12:35   Dg Chest Port 1 View  Result Date: September 12, 2018 CLINICAL DATA:  Found unresponsive. EXAM: PORTABLE CHEST 1 VIEW COMPARISON:  07/05/2018 FINDINGS: The heart is mildly enlarged but stable. Stable tortuosity and calcification of the thoracic aorta. Streaky bibasilar lung opacity could reflect aspiration. No pleural effusion or pneumothorax. The bony thorax is intact. The pacer wires appear stable. IMPRESSION: Streaky bibasilar opacity possibly reflecting aspiration. Electronically Signed   By: Rudie Meyer M.D.   On: 2018/09/12 11:30   Ct Head Code Stroke Wo Contrast  Result Date: Sep 12, 2018 CLINICAL DATA:  Code stroke. Unwitnessed fall at nursing facility. Found on the floor. Last seen normal last night. Anti coagulated. EXAM: CT HEAD WITHOUT CONTRAST TECHNIQUE: Contiguous axial images were obtained from the base of the skull through the vertex without intravenous contrast. COMPARISON:  08/05/2018 FINDINGS: Brain: Severely motion degraded exam. Acute subdural hematoma along the convexity on the right with maximal thickness of 15 mm. Mass effect with right-to-left shift of 7 mm. In the left temporal region, there is question of either small amount of subarachnoid blood or a hemorrhagic contusion at the left temporal tip. Atrophy and chronic small-vessel ischemic changes as seen previously. No sign of acute brain infarction. No hydrocephalus or ventricular trapping. Vascular: There is atherosclerotic calcification of the major vessels at the base of the brain. Skull: No skull fracture. Sinuses/Orbits: Clear/normal Other: None IMPRESSION: 1. Acute right  convexity subdural hematoma with maximal thickness of 15 mm and right-to-left shift of 7 mm. 2. Question of a small amount of subarachnoid blood in the sulci of the left middle cranial fossa and or a minimal hemorrhagic contusion at the left temporal tip. 3. These results were communicated to Dr. Amada Jupiter at 8:46 amon 2020-03-07by text page via the [facility] messaging system. Electronically Signed   By: Paulina Fusi M.D.   On: September 12, 2018 08:50    Procedures Procedures (including critical care time)  Medications Ordered in ED Medications  magic mouthwash (has no administration in time range)  acetaminophen (TYLENOL) tablet 650 mg (has no administration in time range)    Or  acetaminophen (TYLENOL) suppository 650 mg (has no administration in time range)  morphine CONCENTRATE 10 MG/0.5ML oral solution 5 mg (has no administration in time range)    Or  morphine CONCENTRATE 10 MG/0.5ML oral solution 5 mg (has no administration in time range)  LORazepam (ATIVAN) 2 MG/ML concentrated solution 1 mg ( Sublingual See Alternative 09/12/2018 1446)    Or  LORazepam (ATIVAN) injection 1 mg (1 mg Intravenous Given September 12, 2018 1446)  diphenhydrAMINE (BENADRYL) injection 12.5 mg (has no administration in time range)  antiseptic oral rinse (BIOTENE) solution 15 mL (has no administration in time range)  polyvinyl alcohol (LIQUIFILM TEARS) 1.4 % ophthalmic solution 1 drop (has no administration in time range)  morphine 2 MG/ML injection 1-2 mg (2 mg Intravenous Given 2018-09-12 1508)  ondansetron (ZOFRAN) injection 4 mg (4 mg Intravenous Given 12-Sep-2018 1445)  glycopyrrolate (ROBINUL) injection 0.3 mg (0.3 mg Intravenous Given 2018/09/12 1445)  sodium chloride flush (NS) 0.9 % injection 3 mL (3 mLs  Intravenous Given 08/08/2018 0859)     Initial Impression / Assessment and Plan / ED Course  I have reviewed the triage vital signs and the nursing notes.  Pertinent labs & imaging results that were available during my care of the  patient were reviewed by me and considered in my medical decision making (see chart for details).        85 yo F with a chief complaint of a fall and had confusion and persistent vomiting in room.  I made her a code stroke even though her last seen normal was yesterday due to being LVO positive.  CT scan concerning for a right subdural with right to left shift.  Patient also with left side hemorrhagic contusion.  She is hypertensive and was started on Cleviprex.  I discussed the case with Selena BattenKim from neurosurgery she will discuss anticoagulation reversal with Dr. Lovell SheehanJenkins.  Initial blood pressure goal of 160.  CRITICAL CARE Performed by: Rae Roamaniel Patrick Kamerin Grumbine   Total critical care time: 80 minutes  Critical care time was exclusive of separately billable procedures and treating other patients.  Critical care was necessary to treat or prevent imminent or life-threatening deterioration.  Critical care was time spent personally by me on the following activities: development of treatment plan with patient and/or surrogate as well as nursing, discussions with consultants, evaluation of patient's response to treatment, examination of patient, obtaining history from patient or surrogate, ordering and performing treatments and interventions, ordering and review of laboratory studies, ordering and review of radiographic studies, pulse oximetry and re-evaluation of patient's condition.  Seen by neurosurgery.  Family not electing for surgical intervention at this time.  ICU consulted as patient on cleviprex.  After discussion with family now comfort care.  Palliative care consult.   C collar removed for comfort, no appreciable neck pain on exam.  Discussed risks with family, will leave off.    CT C spine without fx.   The patients results and plan were reviewed and discussed.   Any x-rays performed were independently reviewed by myself.   Differential diagnosis were considered with the presenting  HPI.  Medications  magic mouthwash (has no administration in time range)  acetaminophen (TYLENOL) tablet 650 mg (has no administration in time range)    Or  acetaminophen (TYLENOL) suppository 650 mg (has no administration in time range)  morphine CONCENTRATE 10 MG/0.5ML oral solution 5 mg (has no administration in time range)    Or  morphine CONCENTRATE 10 MG/0.5ML oral solution 5 mg (has no administration in time range)  LORazepam (ATIVAN) 2 MG/ML concentrated solution 1 mg ( Sublingual See Alternative 08/10/2018 1446)    Or  LORazepam (ATIVAN) injection 1 mg (1 mg Intravenous Given 08/13/2018 1446)  diphenhydrAMINE (BENADRYL) injection 12.5 mg (has no administration in time range)  antiseptic oral rinse (BIOTENE) solution 15 mL (has no administration in time range)  polyvinyl alcohol (LIQUIFILM TEARS) 1.4 % ophthalmic solution 1 drop (has no administration in time range)  morphine 2 MG/ML injection 1-2 mg (2 mg Intravenous Given 08/22/2018 1508)  ondansetron (ZOFRAN) injection 4 mg (4 mg Intravenous Given 09/03/2018 1445)  glycopyrrolate (ROBINUL) injection 0.3 mg (0.3 mg Intravenous Given 08/24/2018 1445)  sodium chloride flush (NS) 0.9 % injection 3 mL (3 mLs Intravenous Given 08/24/2018 0859)    Vitals:   08/13/2018 1100 08/24/2018 1115 08/12/2018 1130 08/09/2018 1500  BP: 138/83 130/86 132/64 (!) 167/91  Pulse: (!) 109 (!) 115 (!) 111 88  Resp: (!) 25 (!) 25  20 (!) 22  Temp:    98 F (36.7 C)  TempSrc:    Tympanic  SpO2: 98% 100% 100% 98%    Final diagnoses:  SDH (subdural hematoma) (HCC)    Admission/ observation were discussed with the admitting physician, patient and/or family and they are comfortable with the plan.     Final Clinical Impressions(s) / ED Diagnoses   Final diagnoses:  SDH (subdural hematoma) [facility](HCC)    ED Discharge Orders    None       Melene PlanFloyd, Aryaan Persichetti, DO 02/06/2019 1538

## 2018-08-26 NOTE — Consult Note (Signed)
Reason for Consult:SDH Referring Physician: EDP  Crystal Hess is an 85 y.o. female.   HPI:  85 year old female with pacemaker and atrial fibrillation on Eliquis who reportedly fell out of bed this morning at the nursing home.  She is fairly nonverbal and does not answer questions though she is awake.  She is reportedly weak on her right side.  CT scan showed a right-sided acute subdural hematoma and neurosurgical evaluation was requested. She has not received reversal.  The family states that over the last 3 months she has had progressive decline in her functional abilities.  She has had weakness in her legs and had recently gotten to the point that she could get to the bathroom with a walker.  Had 2 recent admissions.  Her functional status at the nursing home has declined to the point that she went from independent living to dependent living.  Has a DNR on the chart.  Past Medical History:  Diagnosis Date  . Anxiety   . Arthritis    "knees, hands" (11/08/2014)  . Asthma   . Chronic lower back pain   . DJD (degenerative joint disease)   . DOE (dyspnea on exertion)    wears O2 at night but has not had a sleep study  . Dysrhythmia   . Family history of adverse reaction to anesthesia    "sister's heart stopped and had to be revived; think it was related to the demoral"  . Gout    "I've had it twice already this year" (11/08/2014)  . Hypertension   . Hyperthyroidism   . Mitral regurgitation    mild  . Obesity   . On home oxygen therapy    "2L just at night" (11/08/2014)  . Persistent atrial fibrillation   . Trace tricuspid regurgitation by prior echocardiogram   . Walking pneumonia ~ 1998    Past Surgical History:  Procedure Laterality Date  . CARDIOVERSION N/A 07/26/2014   Procedure: CARDIOVERSION;  Surgeon: Pamella PertJagadeesh R Ganji, MD;  [facility];  Service: Cardiovascular;  Laterality: N/A;  . CARDIOVERSION N/A 08/30/2014   Procedure: CARDIOVERSION;  Surgeon: Pamella PertJagadeesh R  Ganji, MD;  [facility];  Service: Cardiovascular;  Laterality: N/A;  . CATARACT EXTRACTION W/ INTRAOCULAR LENS  IMPLANT, BILATERAL Bilateral ~ 2006  . EXCISIONAL HEMORRHOIDECTOMY  ~ 1960  . EYE SURGERY    . KNEE CARTILAGE SURGERY Right 1976   "cut it open"  . OVARIAN CYST REMOVAL Right 1963   "gangrene"  . PACEMAKER IMPLANT  05/11/2017   Medtronic MRI SureScan  . SHOULDER ARTHROSCOPY Left ~ 2008   bone spur  . TONSILLECTOMY    . TOTAL KNEE ARTHROPLASTY Right 02/06/2015   Procedure: RIGHT TOTAL KNEE ARTHROPLASTY;  Surgeon: Dannielle HuhSteve Lucey, MD;  Location: [facility] OR;  Service: Orthopedics;  Laterality: Right;    Allergies  Allergen Reactions  . Meperidine And Related Other (See Comments)    "have hallucinations; get combative; I don't want that"  . Other Hives    Polyester- severe   . Codeine Nausea Only  . Methotrexate Derivatives Other (See Comments)    Restless  . Vasculera [Nutritional Supplements] Other (See Comments)    Unknown    Social History   Tobacco Use  . Smoking status: Never Smoker  . Smokeless tobacco: Never Used  Substance Use Topics  . Alcohol use: No    Family History  Problem Relation Age of Onset  . Lymphoma Mother   . Lung cancer Father  smoker  . Emphysema Brother      Review of Systems  Positive ROS: As above  All other systems have been reviewed and were otherwise negative with the exception of those mentioned in the HPI and as above.  Objective: Vital signs in last 24 hours: Pulse Rate:  [109-196] 121 (02/19 0952) Resp:  [18-38] 28 (02/19 0952) BP: (134-195)/(74-120) 149/74 (02/19 0952) SpO2:  [88 %-98 %] 96 % (02/19 0952)  General Appearance: Alert, mild distress, appears stated age Head: Normocephalic, without obvious abnormality, atraumatic Eyes: PERRL, conjunctiva/corneas clear, gaze conjugate     Neck: Supple but in collar Lungs: Respirations on labored but rattly Heart: Regular rate and rhythm Abdomen:  Soft Extremities: Extremities normal, atraumatic, no cyanosis or edema Pulses: 2+ and symmetric all extremities Skin: Skin color, texture, turgor normal, no rashes or lesions  NEUROLOGIC:   Mental status: Alert, with some work will regard the examiner, appears to be aphasic though she does try to stick out her tongue with some effort to get her to do so, otherwise I cannot get her to follow commands Motor Exam - grossly normal, normal tone and bulk except for the right upper extremity which seems to be somewhat weaker than the left though I can get her to localize with the right arm she does move it with some volition spontaneously Sensory Exam -unable to examine Reflexes: symmetric, no pathologic reflexes, No Hoffman's, No clonus Coordination -unable to examine Gait -unable to exam Balance -unable to examine Cranial Nerves: I: smell Not tested  II: visual acuity  OS: na    OD: na     II: pupils Equal, round, reactive to light  III,VII: ptosis None  III,IV,VI: extraocular muscles  Full ROM  V: mastication Normal  V: facial light touch sensation  Normal  V,VII: corneal reflex  Present  VII: facial muscle function - upper  Normal  VII: facial muscle function - lower Normal  VIII: hearing Not tested  IX: soft palate elevation    IX,X: gag reflex Present  XI: trapezius strength    XI: sternocleidomastoid strength   XI: neck flexion strength    XII: tongue strength  Normal    Data Review Lab Results  Component Value Date   WBC 9.0 08/18/2018   HGB 12.0 08/19/2018   HCT 37.2 08/09/2018   MCV 86.3 08/15/2018   PLT 318 08/10/2018   Lab Results  Component Value Date   NA 130 (L) 08/29/2018   K 3.9 08/12/2018   CL 99 09/04/2018   CO2 16 (L) 08/13/2018   BUN 12 08/13/2018   CREATININE 0.40 (L) 08/09/2018   GLUCOSE 242 (H) 08/11/2018   Lab Results  Component Value Date   INR 1.35 08/11/2018    Radiology: Ct Head Code Stroke Wo Contrast  Result Date:  08/24/2018 CLINICAL DATA:  Code stroke. Unwitnessed fall at nursing facility. Found on the floor. Last seen normal last night. Anti coagulated. EXAM: CT HEAD WITHOUT CONTRAST TECHNIQUE: Contiguous axial images were obtained from the base of the skull through the vertex without intravenous contrast. COMPARISON:  08/05/2018 FINDINGS: Brain: Severely motion degraded exam. Acute subdural hematoma along the convexity on the right with maximal thickness of 15 mm. Mass effect with right-to-left shift of 7 mm. In the left temporal region, there is question of either small amount of subarachnoid blood or a hemorrhagic contusion at the left temporal tip. Atrophy and chronic small-vessel ischemic changes as seen previously. No sign of acute brain infarction. No  hydrocephalus or ventricular trapping. Vascular: There is atherosclerotic calcification of the major vessels at the base of the brain. Skull: No skull fracture. Sinuses/Orbits: Clear/normal Other: None IMPRESSION: 1. Acute right convexity subdural hematoma with maximal thickness of 15 mm and right-to-left shift of 7 mm. 2. Question of a small amount of subarachnoid blood in the sulci of the left middle cranial fossa and or a minimal hemorrhagic contusion at the left temporal tip. 3. These results were communicated to Dr. Amada Jupiter at 8:46 amon 2020/03/17by text page via the [facility] messaging system. Electronically Signed   By: Paulina Fusi M.D.   On: September 22, 2018 08:50     Assessment/Plan: Estimated body mass index is 28.17 kg/m as calculated from the following:   Height as of 07/28/18: 5\' 3"  (1.6 m).   Weight as of 07/28/18: 58.10 kg.   85 year old female who has had failing health at a nursing home and recent admissions who fell out of bed reportedly.  She appears to be somewhat aphasic and right hemiparetic and yet has a right-sided subdural hematoma.  She is left-handed.  She does not appear to have enough mass-effect and shift to have a Kernohan's phenomenon,  so I suspect she has actually had a left-sided CVA not noticed on the early CT scan and a right sided subdural hematoma from the fall.  At any rate, she has a DNR on the chart and I suspect she would not want to be overly aggressive with this subdural hematoma.  She is quite awake, but I worry about her respiratory status.  I have had a long discussion with the family regarding the advantages and disadvantages and the risks and the benefits of craniotomy for evacuation of the subdural hematoma.  She is on an oral anticoagulant and that makes the risk of surgery much higher.  Have talked about reversal with adnexxa but it appears that would actually increase the risk of surgery.  I also have concerns that she would be extubated after surgery.  She would not want to be intubated and have prolonged care in the ICU.  I do not believe I can make her quality of life better.  Surgery could potentially prolong her life but not improve quality.  She has had progressive decline.  The family is in agreement that she would not want aggressive care given this outlook.  I would recommend admission to the hospitalist or CCM service for supportive care.  Would not recommend craniotomy for evacuation of the subdural hematoma.  Decision to reverse Eliquis can be made by admitting service.  Have to determine if the risks and benefits and costs are appropriate given the situation.   Tia Alert September 22, 2018 10:21 AM

## 2018-08-26 NOTE — ED Notes (Signed)
Neurosurgery wants bp goal 120-160 systolic

## 2018-08-26 NOTE — Consult Note (Signed)
NAME:  Crystal Hess, MRN:  [id number], DOB:  1929-09-02, LOS: 0 ADMISSION DATE:  08/11/2018, CONSULTATION DATE: 09/03/2018 REFERRING MD: Jobe Igo, ED, CHIEF COMPLAINT: Subdural hematoma  HPI/course in hospital  85 year old woman in declining health for the last 2 months suffered a fall at her nursing home. CT scan revealed a right subdural hematoma over the cortical convexity with mass-effect. Surgery and the family have declined surgical intervention.  CCM was consulted for potential ICU admission for acute blood pressure control.  According to the family since a recent admission for TIA the patient been more withdrawn.  She is no longer living independently and has been having gait disturbances in spite of attempts at rehabilitation.  Past Medical History   Past Medical History:  Diagnosis Date  . Anxiety   . Arthritis    "knees, hands" (11/08/2014)  . Asthma   . Chronic lower back pain   . DJD (degenerative joint disease)   . DOE (dyspnea on exertion)    wears O2 at night but has not had a sleep study  . Dysrhythmia   . Family history of adverse reaction to anesthesia    "sister's heart stopped and had to be revived; think it was related to the demoral"  . Gout    "I've had it twice already this year" (11/08/2014)  . Hypertension   . Hyperthyroidism   . Mitral regurgitation    mild  . Obesity   . On home oxygen therapy    "2L just at night" (11/08/2014)  . Persistent atrial fibrillation   . Trace tricuspid regurgitation by prior echocardiogram   . Walking pneumonia ~ 1998     Past Surgical History:  Procedure Laterality Date  . CARDIOVERSION N/A 07/26/2014   Procedure: CARDIOVERSION;  Surgeon: Pamella Pert, MD;  Location[facility] ENDOSCOPY;  Service: Cardiovascular;  Laterality: N/A;  . CARDIOVERSION N/A 08/30/2014   Procedure: CARDIOVERSION;  Surgeon: Pamella Pert, MD;  Location: [facility] ENDOSCOPY;  Service: Cardiovascular;  Laterality: N/A;  . CATARACT  EXTRACTION W/ INTRAOCULAR LENS  IMPLANT, BILATERAL Bilateral ~ 2006  . EXCISIONAL HEMORRHOIDECTOMY  ~ 1960  . EYE SURGERY    . KNEE CARTILAGE SURGERY Right 1976   "cut it open"  . OVARIAN CYST REMOVAL Right 1963   "gangrene"  . PACEMAKER IMPLANT  05/11/2017   Medtronic MRI SureScan  . SHOULDER ARTHROSCOPY Left ~ 2008   bone spur  . TONSILLECTOMY    . TOTAL KNEE ARTHROPLASTY Right 02/06/2015   Procedure: RIGHT TOTAL KNEE ARTHROPLASTY;  Surgeon: Dannielle Huh, MD;  Location: [facility] OR;  Service: Orthopedics;  Laterality: Right;     Review of Systems:   Review of Systems  Unable to perform ROS: Mental acuity  Negative apart from what is mentioned by family above.  Social History   reports that she has never smoked. She has never used smokeless tobacco. She reports that she does not drink alcohol or use drugs.   Family History   Her family history includes Emphysema in her brother; Lung cancer in her father; Lymphoma in her mother.   Allergies Allergies  Allergen Reactions  . Meperidine And Related Other (See Comments)    "have hallucinations; get combative; I don't want that"  . Other Hives    Polyester- severe   . Codeine Nausea Only  . Methotrexate Derivatives Other (See Comments)    Restless  . Vasculera [Nutritional Supplements] Other (See Comments)    Unknown     Home Medications  Prior to Admission medications   Medication Sig Start Date End Date Taking? Authorizing Provider  allopurinol (ZYLOPRIM) 300 MG tablet Take 300 mg by mouth daily.  01/10/15  Yes [provider]  colchicine 0.6 MG tablet Take 1.2 mg by mouth daily as needed (for gout).   Yes [provider]  dofetilide (TIKOSYN) 250 MCG capsule Take 250 mcg by mouth 2 (two) times daily.  04/29/18  Yes [provider]  ELIQUIS 5 MG TABS tablet Take 5 mg by mouth 2 (two) times daily. 08/12/14  Yes [provider]  furosemide (LASIX) 20 MG tablet Take 20 mg by mouth every other day.   04/20/18  Yes [provider]  gabapentin (NEURONTIN) 100 MG capsule Take 100 mg by mouth at bedtime.    Yes [provider]  irbesartan (AVAPRO) 300 MG tablet Take 300 mg by mouth daily.   Yes [provider]  leflunomide (ARAVA) 20 MG tablet Take 20 mg by mouth daily.   Yes [provider]  loteprednol (LOTEMAX) 0.5 % ophthalmic suspension Place 1 drop into both eyes 2 (two) times daily.   Yes [provider]  MAGNESIUM-OXIDE 400 (241.3 Mg) MG tablet TAKE 1 TABLET(400 MG) BY MOUTH DAILY Patient taking differently: Take 400 mg by mouth daily.  02/24/17  Yes Duke Salvia, MD  methimazole (TAPAZOLE) 5 MG tablet Take 5 mg by mouth at bedtime.  06/13/14  Yes [provider]  metoprolol succinate (TOPROL-XL) 25 MG 24 hr tablet Take 12.5 mg by mouth daily. Hold if SBP <100 or HR < 60   Yes [provider]  potassium chloride SA (K-DUR,KLOR-CON) 20 MEQ tablet Take 20 mEq by mouth daily.  04/20/18  Yes [provider]  Probiotic Product (RISA-BID PROBIOTIC) TABS Take 1 tablet by mouth daily. 250 mg   Yes [provider]  PROVENTIL HFA 108 (90 BASE) MCG/ACT inhaler INHALE 2 PUFFS INTO LUNGS EVERY 6 HOURS AS NEEDED FOR WHEEZING AND SHORTNESS OF BREATH Patient taking differently: Inhale 2 puffs into the lungs every 6 (six) hours as needed for wheezing.  11/05/12  Yes Young, Joni Fears D, MD  sertraline (ZOLOFT) 25 MG tablet Take 25 mg by mouth daily.   Yes [provider]  traMADol (ULTRAM) 50 MG tablet Take 50 mg by mouth every 8 (eight) hours as needed for moderate pain.   Yes [provider]  loperamide (IMODIUM A-D) 2 MG tablet Take 1 tablet (2 mg total) by mouth 4 (four) times daily as needed for diarrhea or loose stools. Patient not taking: Reported on 08/11/2018 07/07/18   Maurilio Lovely D, DO     Interim history/subjective:  Patient is currently nonverbal and is unable to communicate specific complaints.   She seems particularly uncomfortable with the hard c-collar.  Objective   Blood pressure 135/78, pulse (!) 125, resp. rate (!) 28, SpO2 97 %.       No intake or output data in the 24 hours ending 08/21/2018 1128 There were no vitals filed for this visit.  Examination: Physical Exam  Constitutional: She appears distressed.  HENT:  There is no visible head trauma.  Neck:  Neck is in cervical collar.  No pain on palpation.  Cardiovascular: Normal rate and regular rhythm.  Respiratory: Breath sounds normal.  GI: Soft.  Neurological: She is alert.  She is mumbling incomprehensively.  She moves all limbs left side greater than right.  Skin: Skin is warm and dry.  No obvious bruising.  Ancillary tests (personally reviewed)  CBC: Recent Labs  Lab 08/09/2018 0824  WBC 9.0  NEUTROABS 4.9  HGB 12.0  HCT 37.2  MCV 86.3  PLT 318    Basic Metabolic Panel: Recent Labs  Lab 08/10/2018 0824 09/03/2018 0839  NA 130*  --   K 3.9  --   CL 99  --   CO2 16*  --   GLUCOSE 242*  --   BUN 12  --   CREATININE 0.64 0.40*  CALCIUM 9.1  --    GFR: CrCl cannot be calculated (Unknown ideal weight.). Recent Labs  Lab 08/20/2018 0824  WBC 9.0    Liver Function Tests: Recent Labs  Lab 08/20/2018 0824  AST 26  ALT 20  ALKPHOS 69  BILITOT 0.8  PROT 7.1  ALBUMIN 2.8*   No results for input(s): LIPASE, AMYLASE in the last 168 hours. No results for input(s): AMMONIA in the last 168 hours.  ABG    Component Value Date/Time   TCO2 22 07/05/2018 2242     Coagulation Profile: Recent Labs  Lab 08/23/2018 0824  INR 1.35    Cardiac Enzymes: No results for input(s): CKTOTAL, CKMB, CKMBINDEX, TROPONINI in the last 168 hours.  HbA1C: Hgb A1c MFr Bld  Date/Time Value Ref Range Status  07/06/2018 05:05 AM 5.9 (H) 4.8 - 5.6 % Final    Comment:    (NOTE) Pre diabetes:          5.7%-6.4% Diabetes:              >6.4% Glycemic control for   <7.0% adults with diabetes      CBG: Recent Labs  Lab 08/13/2018 0831  GLUCAP 227*     Assessment & Plan:  85 year old declining health who has suffered a frailty fall with subsequent subdural hematoma. The prognosis for a good functional recovery is poor and her condition will continue to decline irrespective of intervention. Thus aggressive care such as anticoagulation reversal (bleed expansion has already stopped) and blood pressure management will not lead to improve long-term quality of life.  The family confirmed that my impression is similar to that which has been communicated by both neurology and neurosurgery and it is their desire to proceed to comfort care possible home hospice once her symptoms are better controlled down the road.  Recommendations:  DNR, DNI, no ICU admission Comfort care measures only Remove cervical collar Palliative care consultation Admission to hospitalist for symptom stabilization and development of a hospice plan.   Lynnell Catalanavi Maxwell Lemen, MD [facility] ICU Physician [facility]  Pager: [phone] Mobile: [phone] After hours: [phone].  08/15/2018, 11:28 AM

## 2018-08-26 NOTE — Code Documentation (Signed)
Neurosurgery PA at the bedside speaking with family about options.

## 2018-08-26 NOTE — Consult Note (Signed)
Neurology Consultation Reason for Consult: Right sided weakness Referring Physician: Adela Lank, D  CC: Code stroke.   History is obtained from: EMS  HPI: Crystal Hess is a 85 y.o. female with a history of hypertension, atrial fibrillation, on anticoagulation who was last seen well last night, unclear time.  This morning she was found at the nursing home having fallen and was less responsive than typical therefore brought in via EMS. In the ER, due to right-sided weakness, code stroke was activated.  CT shows right-sided subdural as well has left-sided hemorrhagic contusions.   LKW: Last night tpa given?: no, ICH   ROS: Unable to obtain due to altered mental status.   Past Medical History:  Diagnosis Date  . Anxiety   . Arthritis    "knees, hands" (11/08/2014)  . Asthma   . Chronic lower back pain   . DJD (degenerative joint disease)   . DOE (dyspnea on exertion)    wears O2 at night but has not had a sleep study  . Dysrhythmia   . Family history of adverse reaction to anesthesia    "sister's heart stopped and had to be revived; think it was related to the demoral"  . Gout    "I've had it twice already this year" (11/08/2014)  . Hypertension   . Hyperthyroidism   . Mitral regurgitation    mild  . Obesity   . On home oxygen therapy    "2L just at night" (11/08/2014)  . Persistent atrial fibrillation   . Trace tricuspid regurgitation by prior echocardiogram   . Walking pneumonia ~ 1998     Family History  Problem Relation Age of Onset  . Lymphoma Mother   . Lung cancer Father        smoker  . Emphysema Brother      Social History:  reports that she has never smoked. She has never used smokeless tobacco. She reports that she does not drink alcohol or use drugs.   Exam: Current vital signs: Vitals:   08/14/2018 0848 08/31/2018 0856  BP: (!) 192/91 (!) 185/85  Pulse: (!) 115 (!) 131  Resp:  (!) 38  SpO2: 95% 94%   Vital signs in last 24 hours:     Physical  Exam  Constitutional: Appears well-developed and well-nourished.  Psych: Affect appropriate to situation Eyes: No scleral injection HENT: No OP obstrucion Head: Normocephalic.  Cardiovascular: Normal rate and regular rhythm.  Respiratory: Effort normal, non-labored breathing GI: Soft.  No distension. There is no tenderness.  Skin: WDI  Neuro: Mental Status: Patient is awake, she does fixate and track, she does not follow commands. Cranial Nerves: II: The patient does fixate and track, but does not blink to threat from either direction pupils are equal, round, and reactive to light.   III,IV, VI: She crosses midline in both directions  VII: She has right facial weakness Motor: She moves left arm and leg well and purposefully, the right leg she does lift against gravity, right arm has decreased movement. Sensory: She does respond to noxious stimulation in all 4 extremities Cerebellar: Does not perform  I have reviewed labs in epic and the results pertinent to this consultation are: CBC- unremarkable  I have reviewed the images obtained: CT head- left-sided hemorrhagic contusion as well as right-sided subdural hematoma  Impression: 85 year old female with traumatic brain injury.  Blood pressure was elevated and therefore clevidipine was started, ER is discussing BP goals and decision for reversal with neurosurgery at this  time.  Recommendations: 1) anticoagulation, surgical decisions per neurosurgery 2) please call if neurology can be of any further assistance.   Ritta Slot, MD [facility] [phone]  If 7pm- 7am, please page neurology on call as listed in AMION.

## 2018-08-26 NOTE — ED Notes (Signed)
pts nephew at bedside

## 2018-08-26 NOTE — Consult Note (Signed)
Consultation Note Date: 08/16/2018   Patient Name: Crystal Hess  DOB: [birth year]  MRN: [id number]  Age / Sex: 85 y.o., female  PCP: Hayden Rasmussen, MD Referring Physician: Albertine Patricia, MD  Reason for Consultation: Establishing goals of care, Hospice Evaluation, Non pain symptom management and Pain control  HPI/Patient Profile: 85 y.o. female admitted on 09/01/2018 from [facility] after an unwitnessed fall out of her bed. She has a past medical history of hypertension, gout, pacemaker, hypothyroidism, asthma, mitral regurgitation, and atrial fibrillation (Eliquis). During her ED course patient was found to be nonverbal, unable to follow commands, with weakness. CT of head showed right-sided acute subdural hematoma. Chest x-ray showed streaky bibasilar opacity due to possible aspiration. 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 Patient was seen in ED. Her brother, Sonia Side at bedside. Patient has DNR in place. She is in fetal position with a constant moan and signs of pain versus agitation. Neurology and Neurosurgery has seen patient with recommendations of medical support and comfort. No interventions given usage of Eliquis and quality of life. Palliative Medicine team consulted for goals of care discussion.   Clinical Assessment and Goals of Care: I have reviewed medical records including lab results, imaging, Epic notes, and MAR, received report from the bedside RN, and assessed the patient. I met at the bedside with Shirlee Latch, brother/POA to discuss diagnosis prognosis, GOC, EOL wishes, disposition and options. Patient is constantly moaning, nonverbal, and unable to follow commands.   I introduced Palliative Medicine as specialized medical care for people living with serious illness. It focuses on  providing relief from the symptoms and stress of a serious illness. The goal is to improve quality of life for both the patient and the family.  We discussed a brief life review of the patient. Patient is a retired Automotive engineer. She never married and had no children. She is the oldest of 5 siblings with only 2 surviving. Brother reports patient recently moved here about 2.5 years ago from [place], [place]. He reported their younger sister passed away 10/24/2017 and months after patient has continued to decline. She was residing in the independent area at [facility] but began to decline and later transferred to the skilled area.   Brother reports patient's health has declined drastically over the past 6-9 months. He expressed her gait had become unsteady and she would often spend most of her days sleeping or in the bed. She was unable to ambulate independently due to weakness. He reports she would recognize family on different occassions but other times she did not. Her appetite was decreasing and he knows that she has lost some weight over the past months. He reported at Christmas patient was alert and independent, however, she reported it being the worst time of her life and that her health was not that great. He reports visiting on several occassions and finding her naked and or in her  own feces.   Brother verbalized awareness of decline after she began losing interest in normal activities as she was very active socially, in church, and cared for others.   We discussed her current illness and what it means in the larger context of her on-going co-morbidities.  Natural disease trajectory and expectations at EOL were discussed. Sonia Side was tearful and verbalized understanding of her condition.   I attempted to elicit values and goals of care important to the patient.  Sonia Side expressed he was aware her wishes as they had discussed on multiple occassions. He reported he knows her quality of life has  decreased and she would not want to live in her current state, have aggressive measures, or undergo multiple medical work-ups etc.   The difference between aggressive medical intervention and comfort care was considered in light of the patient's goals of care. Sonia Side expressed his wishes for his sister were for full comfort care measures with awareness of end-of-life. I educated family on what comfort care measures would look like. I discussed with him patient would not receive aggressive medical interventions such as continuous vital signs, lab work, radiology testing, or medications not focused on comfort based on his expressed goals of care for Ms. Dipaola.  He is aware that all care will focus on how the patient is looking and feeling. This will include management of any symptoms that may cause discomfort, pain, shortness of breath, cough, nausea, agitation, anxiety, and/or secretions etc. Symptoms will be managed with medications and other non-pharmacological interventions such as spiritual support if requested, repositioning, music therapy, or therapeutic listening.   Brother verbalized understanding and appreciation. He again expressed his wishes to proceed with comfort. His Doristine Bosworth is now at the bedside offering support.   Sonia Side confirmed DNR/DNI. Patient has advanced directive and patient's brother is POA.   I discussed in detail comfort measures in the hospital and also residential hospice versus hospice at home/facility. Brother is tearful and expressed he is unable to care for patient in his own home although he cared for her for about a week until she was able to get a skilled bed. He also expressed he would not want her to return to facility as he worries she will suffer and symptoms may not be managed appropriately. Sonia Side is in favor for residential hospice for EOL support and comfort. He verbalized goals and care of hospice home. He states he is familiar with [facility] and this would be his  ideal location if bed available or patient does not pass away in the hospital. Support given. I educated brother on referral process and transition to hospice home pending availability.   Questions and concerns were addressed. The family was encouraged to call with questions or concerns.  PMT will continue to support holistically.  Primary Decision Maker:  HCPOA/BROTHER: JERRY Murrell     SUMMARY OF RECOMMENDATIONS    DNR/DNI-as confirmed by brother (Out of facility DNR form on chart)  FULL COMFORT MEASURES/EOL SUPPORT   Brother requesting comfort measures. He has clear understanding of patient's condition and verbalized he is aware of her wishes. No escalation of care or surgical interventions. No aggressive measures.   Brother is requesting Optometrist for EOL if available. Expressed his familiarity with facility.   CSW referral for [facility].   Morphine PRN for pain/air hunger  Ativan PRN for anxiety/agitation  Robinul PRN for excessive secretions  Zofran PRN for nausea  PMT will continue to support and follow.  Code Status/Advance  Care Planning:  DNR/DNI-as confirmed by brother/poa   Symptom Management:   Morphine PRN for pain/air hunger  Ativan PRN for anxiety/agitation  Robinul PRN for excessive secretions  Zofran PRN for nausea  Palliative Prophylaxis:   Aspiration, Delirium Protocol, Frequent Pain Assessment, Oral Care and Turn Reposition  Additional Recommendations (Limitations, Scope, Preferences):  Full Comfort Care  Psycho-social/Spiritual:   Desire for further Chaplaincy support: YES (Family Pastor at bedside)   Prognosis:   Hours - Days in the setting of comfort measures only, atrial fibrillation (Eliquis), s/p fall with right-sided subdural hematoma, malnutrition, poor po intake, decreased mobility, risk of aspiration, hypertension, hyperthyroidism, and gout.   Discharge Planning: Hospice facility      Primary Diagnoses: Present on  Admission: . Subdural hematoma ([place]) . Essential hypertension . Gout . Hyperthyroidism . Persistent atrial fibrillation   I have reviewed the medical record, interviewed the patient and family, and examined the patient. The following aspects are pertinent.  Past Medical History:  Diagnosis Date  . Anxiety   . Arthritis    "knees, hands" (11/08/2014)  . Asthma   . Chronic lower back pain   . DJD (degenerative joint disease)   . DOE (dyspnea on exertion)    wears O2 at night but has not had a sleep study  . Dysrhythmia   . Family history of adverse reaction to anesthesia    "sister's heart stopped and had to be revived; think it was related to the demoral"  . Gout    "I've had it twice already this year" (11/08/2014)  . Hypertension   . Hyperthyroidism   . Mitral regurgitation    mild  . Obesity   . On home oxygen therapy    "2L just at night" (11/08/2014)  . Persistent atrial fibrillation   . Pre-diabetes    family states that patient is borderline  . Trace tricuspid regurgitation by prior echocardiogram   . Walking pneumonia ~ 1998   Social History   Socioeconomic History  . Marital status: Single    Spouse name: Not on file  . Number of children: 0  . Years of education: Not on file  . Highest education level: Not on file  Occupational History  . Occupation: Retired Education officer, museum  Social Needs  . Financial resource strain: Not on file  . Food insecurity:    Worry: Not on file    Inability: Not on file  . Transportation needs:    Medical: Not on file    Non-medical: Not on file  Tobacco Use  . Smoking status: Never Smoker  . Smokeless tobacco: Never Used  Substance and Sexual Activity  . Alcohol use: No  . Drug use: No  . Sexual activity: Not on file  Lifestyle  . Physical activity:    Days per week: Not on file    Minutes per session: Not on file  . Stress: Not on file  Relationships  . Social connections:    Talks on phone: Not on file    Gets  together: Not on file    Attends religious service: Not on file    Active member of club or organization: Not on file    Attends meetings of clubs or organizations: Not on file    Relationship status: Not on file  Other Topics Concern  . Not on file  Social History Narrative   Lives in [place]. Single.   Nephew and his wife live with her.      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 Lymphoma Mother   . Lung cancer Father        smoker  . Emphysema Brother    Scheduled Meds: Continuous Infusions: PRN Meds:.acetaminophen **OR** acetaminophen, antiseptic oral rinse, diphenhydrAMINE, [DISCONTINUED] glycopyrrolate **OR** [DISCONTINUED] glycopyrrolate **OR** glycopyrrolate, [DISCONTINUED] LORazepam **OR** LORazepam **OR** LORazepam, magic mouthwash, morphine injection, morphine CONCENTRATE **OR** morphine CONCENTRATE, ondansetron (ZOFRAN) IV, polyvinyl alcohol Medications Prior to Admission:  Prior to Admission medications   Medication Sig Start Date End Date Taking? Authorizing Provider  allopurinol (ZYLOPRIM) 300 MG tablet Take 300 mg by mouth daily.  01/10/15  Yes [provider]  colchicine 0.6 MG tablet Take 1.2 mg by mouth daily as needed (for gout).   Yes [provider]  dofetilide (TIKOSYN) 250 MCG capsule Take 250 mcg by mouth 2 (two) times daily.  04/29/18  Yes [provider]  ELIQUIS 5 MG TABS tablet Take 5 mg by mouth 2 (two) times daily. 08/12/14  Yes [provider]  furosemide (LASIX) 20 MG tablet Take 20 mg by mouth every other day.  04/20/18  Yes [provider]  gabapentin (NEURONTIN) 100 MG capsule Take 100 mg by mouth at bedtime.    Yes [provider]  irbesartan (AVAPRO) 300 MG tablet Take 300 mg by mouth daily.   Yes [provider]  leflunomide (ARAVA) 20 MG tablet Take 20 mg by mouth daily.   Yes [provider]    loteprednol (LOTEMAX) 0.5 % ophthalmic suspension Place 1 drop into both eyes 2 (two) times daily.   Yes [provider]  MAGNESIUM-OXIDE 400 (241.3 Mg) MG tablet TAKE 1 TABLET(400 MG) BY MOUTH DAILY Patient taking differently: Take 400 mg by mouth daily.  02/24/17  Yes Deboraha Sprang, MD  methimazole (TAPAZOLE) 5 MG tablet Take 5 mg by mouth at bedtime.  06/13/14  Yes [provider]  metoprolol succinate (TOPROL-XL) 25 MG 24 hr tablet Take 12.5 mg by mouth daily. Hold if SBP <100 or HR < 60   Yes [provider]  potassium chloride SA (K-DUR,KLOR-CON) 20 MEQ tablet Take 20 mEq by mouth daily.  04/20/18  Yes [provider]  Probiotic Product (RISA-BID PROBIOTIC) TABS Take 1 tablet by mouth daily. 250 mg   Yes [provider]  PROVENTIL HFA 108 (90 BASE) MCG/ACT inhaler INHALE 2 PUFFS INTO LUNGS EVERY 6 HOURS AS NEEDED FOR WHEEZING AND SHORTNESS OF BREATH Patient taking differently: Inhale 2 puffs into the lungs every 6 (six) hours as needed for wheezing.  11/05/12  Yes Young, Tarri Fuller D, MD  sertraline (ZOLOFT) 25 MG tablet Take 25 mg by mouth daily.   Yes [provider]  traMADol (ULTRAM) 50 MG tablet Take 50 mg by mouth every 8 (eight) hours as needed for moderate pain.   Yes [provider]  loperamide (IMODIUM A-D) 2 MG tablet Take 1 tablet (2 mg total) by mouth 4 (four) times daily as needed for diarrhea or loose stools. Patient not taking: Reported on 08/23/2018 07/07/18   Heath Lark D, DO   Allergies  Allergen Reactions  . Meperidine And Related Other (See Comments)    "have hallucinations; get combative; I don't want that"  . Other Hives    Polyester- severe   . Codeine Nausea Only  . Methotrexate Derivatives Other (See Comments)    Restless  . Vasculera [Nutritional Supplements] Other (See Comments)  Unknown   Review of Systems  Unable to perform ROS: Acuity of condition    Physical Exam Vitals signs and  nursing note reviewed.  Constitutional:      General: She is awake.     Appearance: She is underweight. She is ill-appearing.  Cardiovascular:     Rate and Rhythm: Rhythm regularly irregular.     Pulses: Decreased pulses.     Heart sounds: Normal heart sounds.  Pulmonary:     Effort: Pulmonary effort is normal.     Breath sounds: Decreased breath sounds present.  Abdominal:     General: Bowel sounds are decreased.     Palpations: Abdomen is soft.  Musculoskeletal:     Comments: Generalized weakness  Skin:    General: Skin is warm and dry.     Findings: Bruising present.     Comments: Thin   Neurological:     Mental Status: She is alert.     Motor: Weakness and atrophy present.     Comments: Unable to follow commands, appears agitated/pain, constant moaning   Psychiatric:        Behavior: Behavior is agitated.        Cognition and Memory: Cognition is impaired. Memory is impaired.        Judgment: Judgment is inappropriate.    Vital Signs: BP (!) 167/91 (BP Location: Right Arm)   Pulse 88   Temp 98 F (36.7 C) (Tympanic)   Resp (!) 22   SpO2 98%  Pain Scale: PAINAD   Pain Score: Asleep   SpO2: SpO2: 98 % O2 Device:SpO2: 98 % O2 Flow Rate: .   IO: Intake/output summary: No intake or output data in the 24 hours ending 09/02/2018 1633  LBM:   Baseline Weight:   Most recent weight:       Palliative Assessment/Data: PPS 10%   Time In: 1215 Time Out: 1330 Time Total: 75 min  Greater than 50%  of this time was spent counseling and coordinating care related to the above assessment and plan.  Signed by:  Alda Lea, AGPCNP-BC Palliative Medicine Team  Phone: [phone] Fax: [phone] Pager: [phone] Amion: Bjorn Pippin    Please contact Palliative Medicine Team phone at [phone] for questions and concerns.  For individual provider: See Shea Evans

## 2018-08-26 NOTE — H&P (Signed)
TRH H&P   Patient Demographics:    Crystal StarksFrances Hess, is a 85 y.o. female  MRN: [id number]   DOB - 1929/12/04  Admit Date - 08/24/2018  Outpatient Primary MD for the patient is Dois Davenportichter, Karen L, MD  Referring MD/NP/PA: Dr Shon MilletFloyed  Patient coming from: SNF  Chief Complaint  Patient presents with  . Code Stroke      HPI:    Crystal Hess  is a 85 y.o. female, past medical history for A. fib, on Eliquis, hypothyroidism, hypertension, gout, pacemaker, patient fell out of bed this morning at the nursing home, he was nonverbal, does not follow commands while in ED, CT showed right-sided acute subdural hematoma, she is on Eliquis for A. fib, brother at bedside, patient has been having continued declining of health over the last 2044-month, with multiple recent ED visits, it was evaluated by neurosurgery, ICU team on admission, other who is caregiver(and is not married with no children), expressed his wishes for comfort measures, and no aggressive intervention, and his wishes to proceed with comfort care, hospitalist were called to admit.    Review of systems:    Unable to perform review of systems given her mental status   With Past History of the following :    Past Medical History:  Diagnosis Date  . Anxiety   . Arthritis    "knees, hands" (11/08/2014)  . Asthma   . Chronic lower back pain   . DJD (degenerative joint disease)   . DOE (dyspnea on exertion)    wears O2 at night but has not had a sleep study  . Dysrhythmia   . Family history of adverse reaction to anesthesia    "sister's heart stopped and had to be revived; think it was related to the demoral"  . Gout    "I've had it twice already this year" (11/08/2014)  . Hypertension   . Hyperthyroidism   . Mitral regurgitation    mild  . Obesity   . On home oxygen therapy    "2L just at night" (11/08/2014)  . Persistent  atrial fibrillation   . Trace tricuspid regurgitation by prior echocardiogram   . Walking pneumonia ~ 1998      Past Surgical History:  Procedure Laterality Date  . CARDIOVERSION N/A 07/26/2014   Procedure: CARDIOVERSION;  Surgeon: Pamella PertJagadeesh R Ganji, MD;  [facility];  Service: Cardiovascular;  Laterality: N/A;  . CARDIOVERSION N/A 08/30/2014   Procedure: CARDIOVERSION;  Surgeon: Pamella PertJagadeesh R Ganji, MD;  [facility];  Service: Cardiovascular;  Laterality: N/A;  . CATARACT EXTRACTION W/ INTRAOCULAR LENS  IMPLANT, BILATERAL Bilateral ~ 2006  . EXCISIONAL HEMORRHOIDECTOMY  ~ 1960  . EYE SURGERY    . KNEE CARTILAGE SURGERY Right 1976   "cut it open"  . OVARIAN CYST REMOVAL Right 1963   "gangrene"  . PACEMAKER IMPLANT  05/11/2017   [employer] MRI  SureScan  . SHOULDER ARTHROSCOPY Left ~ 2008   bone spur  . TONSILLECTOMY    . TOTAL KNEE ARTHROPLASTY Right 02/06/2015   Procedure: RIGHT TOTAL KNEE ARTHROPLASTY;  Surgeon: Dannielle Huh, MD;  Location: [facility] OR;  Service: Orthopedics;  Laterality: Right;      Social History:     Social History   Tobacco Use  . Smoking status: Never Smoker  . Smokeless tobacco: Never Used  Substance Use Topics  . Alcohol use: No     Lives -at SNF  Mobility -with assistance    Family History :     Family History  Problem Relation Age of Onset  . Lymphoma Mother   . Lung cancer Father        smoker  . Emphysema Brother      Home Medications:   Prior to Admission medications   Medication Sig Start Date End Date Taking? Authorizing Provider  allopurinol (ZYLOPRIM) 300 MG tablet Take 300 mg by mouth daily.  01/10/15  Yes [provider]  colchicine 0.6 MG tablet Take 1.2 mg by mouth daily as needed (for gout).   Yes [provider]  dofetilide (TIKOSYN) 250 MCG capsule Take 250 mcg by mouth 2 (two) times daily.  04/29/18  Yes [provider]  ELIQUIS 5 MG TABS tablet Take 5 mg by mouth 2 (two) times  daily. 08/12/14  Yes [provider]  furosemide (LASIX) 20 MG tablet Take 20 mg by mouth every other day.  04/20/18  Yes [provider]  gabapentin (NEURONTIN) 100 MG capsule Take 100 mg by mouth at bedtime.    Yes [provider]  irbesartan (AVAPRO) 300 MG tablet Take 300 mg by mouth daily.   Yes [provider]  leflunomide (ARAVA) 20 MG tablet Take 20 mg by mouth daily.   Yes [provider]  loteprednol (LOTEMAX) 0.5 % ophthalmic suspension Place 1 drop into both eyes 2 (two) times daily.   Yes [provider]  MAGNESIUM-OXIDE 400 (241.3 Mg) MG tablet TAKE 1 TABLET(400 MG) BY MOUTH DAILY Patient taking differently: Take 400 mg by mouth daily.  02/24/17  Yes Duke Salvia, MD  methimazole (TAPAZOLE) 5 MG tablet Take 5 mg by mouth at bedtime.  06/13/14  Yes [provider]  metoprolol succinate (TOPROL-XL) 25 MG 24 hr tablet Take 12.5 mg by mouth daily. Hold if SBP <100 or HR < 60   Yes [provider]  potassium chloride SA (K-DUR,KLOR-CON) 20 MEQ tablet Take 20 mEq by mouth daily.  04/20/18  Yes [provider]  Probiotic Product (RISA-BID PROBIOTIC) TABS Take 1 tablet by mouth daily. 250 mg   Yes [provider]  PROVENTIL HFA 108 (90 BASE) MCG/ACT inhaler INHALE 2 PUFFS INTO LUNGS EVERY 6 HOURS AS NEEDED FOR WHEEZING AND SHORTNESS OF BREATH Patient taking differently: Inhale 2 puffs into the lungs every 6 (six) hours as needed for wheezing.  11/05/12  Yes Young, Joni Fears D, MD  sertraline (ZOLOFT) 25 MG tablet Take 25 mg by mouth daily.   Yes [provider]  traMADol (ULTRAM) 50 MG tablet Take 50 mg by mouth every 8 (eight) hours as needed for moderate pain.   Yes [provider]  loperamide (IMODIUM A-D) 2 MG tablet Take 1 tablet (2 mg total) by mouth 4 (four) times daily as needed for diarrhea or loose stools. Patient not taking: Reported on 08/10/2018 07/07/18   Maurilio Lovely D, DO  Allergies:     Allergies  Allergen Reactions  . Meperidine And Related Other (See Comments)    "have hallucinations; get combative; I don't want that"  . Other Hives    Polyester- severe   . Codeine Nausea Only  . Methotrexate Derivatives Other (See Comments)    Restless  . Vasculera [Nutritional Supplements] Other (See Comments)    Unknown     Physical Exam:   Vitals  Blood pressure 132/64, pulse (!) 111, resp. rate 20, SpO2 100 %.   1. General frail elderly female, laying in bed, slightly restless, does not follow command, or answer questions, her eyes are open  2.  She is noncommunicative, does not follow commands, eyes are open, but does not answer questions  3.  Unable to perform appropriate physical exam he does not follow command, but motor appears to be grossly normal, unable to examine sensory  4. Ears and Eyes appear Normal, Conjunctivae clear, PERRLA. Moist Oral Mucosa.  5. Supple Neck, No JVD, No cervical lymphadenopathy appriciated, No Carotid Bruits.  6. Symmetrical Chest wall movement, Good air movement bilaterally, CTAB.  7. RRR, No Gallops, Rubs or Murmurs, No Parasternal Heave.  8. Positive Bowel Sounds, Abdomen Soft, No tenderness, No organomegaly appriciated,No rebound -guarding or rigidity.  9.  No Cyanosis, Normal Skin Turgor, No Skin Rash or Bruise.  10. Good muscle tone,  joints appear normal , no effusions, Normal ROM.  11. No Palpable Lymph Nodes in Neck or Axillae   Data Review:    CBC Recent Labs  Lab 09/09/18 0824  WBC 9.0  HGB 12.0  HCT 37.2  PLT 318  MCV 86.3  MCH 27.8  MCHC 32.3  RDW 14.2  LYMPHSABS 2.8  MONOABS 0.8  EOSABS 0.2  BASOSABS 0.1   ------------------------------------------------------------------------------------------------------------------  Chemistries  Recent Labs  Lab 2018/09/09 0824 09/09/2018 0839  NA 130*  --   K 3.9  --   CL 99  --   CO2 16*  --   GLUCOSE 242*  --   BUN 12  --    CREATININE 0.64 0.40*  CALCIUM 9.1  --   AST 26  --   ALT 20  --   ALKPHOS 69  --   BILITOT 0.8  --    ------------------------------------------------------------------------------------------------------------------ CrCl cannot be calculated (Unknown ideal weight.). ------------------------------------------------------------------------------------------------------------------ No results for input(s): TSH, T4TOTAL, T3FREE, THYROIDAB in the last 72 hours.  Invalid input(s): FREET3  Coagulation profile Recent Labs  Lab 09/09/18 0824  INR 1.35   ------------------------------------------------------------------------------------------------------------------- No results for input(s): DDIMER in the last 72 hours. -------------------------------------------------------------------------------------------------------------------  Cardiac Enzymes No results for input(s): CKMB, TROPONINI, MYOGLOBIN in the last 168 hours.  Invalid input(s): CK ------------------------------------------------------------------------------------------------------------------ No results found for: BNP   ---------------------------------------------------------------------------------------------------------------  Urinalysis    Component Value Date/Time   COLORURINE YELLOW 07/28/2018 1735   APPEARANCEUR CLEAR 07/28/2018 1735   LABSPEC 1.011 07/28/2018 1735   PHURINE 6.0 07/28/2018 1735   GLUCOSEU NEGATIVE 07/28/2018 1735   HGBUR SMALL (A) 07/28/2018 1735   BILIRUBINUR NEGATIVE 07/28/2018 1735   KETONESUR NEGATIVE 07/28/2018 1735   PROTEINUR 30 (A) 07/28/2018 1735   UROBILINOGEN 0.2 01/30/2015 1546   NITRITE NEGATIVE 07/28/2018 1735   LEUKOCYTESUR NEGATIVE 07/28/2018 1735    ----------------------------------------------------------------------------------------------------------------   Imaging Results:    Ct Cervical Spine Wo Contrast  Result Date: 09/09/2018 CLINICAL DATA:  Fall  EXAM: CT CERVICAL SPINE WITHOUT CONTRAST TECHNIQUE: Multidetector CT imaging of the cervical spine was performed without intravenous contrast. Multiplanar CT  image reconstructions were also generated. COMPARISON:  None. FINDINGS: Alignment: Normal. Skull base and vertebrae: No acute fracture. No primary bone lesion or focal pathologic process. Soft tissues and spinal canal: No prevertebral fluid or swelling. No visible canal hematoma. Disc levels: There is severe DISH and near complete ankylosis of the cervical spine. Upper chest: Negative. Other: Large, partially imaged nodule of the left lobe of the thyroid, which deflects the trachea. IMPRESSION: 1. No fracture or static subluxation of the cervical spine. Severe DISH and near-total ankylosis of the cervical spine. 2. Large, partially imaged nodule of the left lobe of the thyroid, which deflects the trachea. Electronically Signed   By: Lauralyn Primes M.D.   On: 08/31/2018 12:35   Dg Chest Port 1 View  Result Date: 09/03/2018 CLINICAL DATA:  Found unresponsive. EXAM: PORTABLE CHEST 1 VIEW COMPARISON:  07/05/2018 FINDINGS: The heart is mildly enlarged but stable. Stable tortuosity and calcification of the thoracic aorta. Streaky bibasilar lung opacity could reflect aspiration. No pleural effusion or pneumothorax. The bony thorax is intact. The pacer wires appear stable. IMPRESSION: Streaky bibasilar opacity possibly reflecting aspiration. Electronically Signed   By: Rudie Meyer M.D.   On: 08/13/2018 11:30   Ct Head Code Stroke Wo Contrast  Result Date: 08/09/2018 CLINICAL DATA:  Code stroke. Unwitnessed fall at nursing facility. Found on the floor. Last seen normal last night. Anti coagulated. EXAM: CT HEAD WITHOUT CONTRAST TECHNIQUE: Contiguous axial images were obtained from the base of the skull through the vertex without intravenous contrast. COMPARISON:  08/05/2018 FINDINGS: Brain: Severely motion degraded exam. Acute subdural hematoma along the  convexity on the right with maximal thickness of 15 mm. Mass effect with right-to-left shift of 7 mm. In the left temporal region, there is question of either small amount of subarachnoid blood or a hemorrhagic contusion at the left temporal tip. Atrophy and chronic small-vessel ischemic changes as seen previously. No sign of acute brain infarction. No hydrocephalus or ventricular trapping. Vascular: There is atherosclerotic calcification of the major vessels at the base of the brain. Skull: No skull fracture. Sinuses/Orbits: Clear/normal Other: None IMPRESSION: 1. Acute right convexity subdural hematoma with maximal thickness of 15 mm and right-to-left shift of 7 mm. 2. Question of a small amount of subarachnoid blood in the sulci of the left middle cranial fossa and or a minimal hemorrhagic contusion at the left temporal tip. 3. These results were communicated to Dr. Amada Jupiter at 8:46 amon 02/26/2020by text page via the [facility] messaging system. Electronically Signed   By: Paulina Fusi M.D.   On: 08/18/2018 08:50     Assessment & Plan:    Active Problems:   Persistent atrial fibrillation   Essential hypertension   Hyperthyroidism   Gout   Subdural hematoma (HCC)    Subdural hematoma -Secondary to fall, on patient who is taking Eliquis, and was seen by neurosurgery, ICU team, her prognosis for good functional recovery is poor, condition is expected to continue to decline irrespective of intervention, thus family has declined for any interventions, and their wishes for comfort measures only. -Patient is admitted under comfort care order sets, have discussed with brother, I believe he would like to be discharged possibly to [facility], but he would like to evaluate that facility first, I think about his options, I will consult palliative medicine, I will did put social work consult for [facility] evaluation, continue with comfort measures during hospital stay. -He is comfort DNR/DNI/comfort  measures  History of A. fib,  hypertension, hypothyroidism, gout -All her meds on hold given she is unsafe to swallow, current focus is for comfort currently.     Family Communication: Admission, patients condition and plan of care including tests being ordered have been discussed with the brother who indicate understanding and agree with the plan and Code Status.  Code Status DNR/Comfort  Likely DC to Hospice  Condition GUARDED/comfort care  Consults called: Neurosurgery, PCCM, neurology  Admission status: Observation  Time spent in minutes : 50 minutes   Huey Bienenstock M.D on 08/14/2018 at 1:35 PM  Between 7am to 7pm - Pager - [phone]. After 7pm go to [email or web address] - password [facility]  [facility] - Office  [phone]

## 2018-08-27 DIAGNOSIS — Z79899 Other long term (current) drug therapy: Secondary | ICD-10-CM | POA: Diagnosis not present

## 2018-08-27 DIAGNOSIS — I081 Rheumatic disorders of both mitral and tricuspid valves: Secondary | ICD-10-CM | POA: Diagnosis present

## 2018-08-27 DIAGNOSIS — Z515 Encounter for palliative care: Secondary | ICD-10-CM

## 2018-08-27 DIAGNOSIS — R410 Disorientation, unspecified: Secondary | ICD-10-CM | POA: Diagnosis present

## 2018-08-27 DIAGNOSIS — Z7189 Other specified counseling: Secondary | ICD-10-CM | POA: Diagnosis not present

## 2018-08-27 DIAGNOSIS — Z66 Do not resuscitate: Secondary | ICD-10-CM | POA: Diagnosis not present

## 2018-08-27 DIAGNOSIS — Z7901 Long term (current) use of anticoagulants: Secondary | ICD-10-CM | POA: Diagnosis not present

## 2018-08-27 DIAGNOSIS — Z6828 Body mass index (BMI) 28.0-28.9, adult: Secondary | ICD-10-CM | POA: Diagnosis not present

## 2018-08-27 DIAGNOSIS — Z950 Presence of cardiac pacemaker: Secondary | ICD-10-CM | POA: Diagnosis not present

## 2018-08-27 DIAGNOSIS — G8929 Other chronic pain: Secondary | ICD-10-CM | POA: Diagnosis present

## 2018-08-27 DIAGNOSIS — I4819 Other persistent atrial fibrillation: Secondary | ICD-10-CM | POA: Diagnosis not present

## 2018-08-27 DIAGNOSIS — R4701 Aphasia: Secondary | ICD-10-CM | POA: Diagnosis present

## 2018-08-27 DIAGNOSIS — J45909 Unspecified asthma, uncomplicated: Secondary | ICD-10-CM | POA: Diagnosis present

## 2018-08-27 DIAGNOSIS — W06XXXA Fall from bed, initial encounter: Secondary | ICD-10-CM | POA: Diagnosis present

## 2018-08-27 DIAGNOSIS — E0590 Thyrotoxicosis, unspecified without thyrotoxic crisis or storm: Secondary | ICD-10-CM | POA: Diagnosis present

## 2018-08-27 DIAGNOSIS — Z8673 Personal history of transient ischemic attack (TIA), and cerebral infarction without residual deficits: Secondary | ICD-10-CM | POA: Diagnosis not present

## 2018-08-27 DIAGNOSIS — E440 Moderate protein-calorie malnutrition: Secondary | ICD-10-CM | POA: Diagnosis present

## 2018-08-27 DIAGNOSIS — Z888 Allergy status to other drugs, medicaments and biological substances status: Secondary | ICD-10-CM | POA: Diagnosis not present

## 2018-08-27 DIAGNOSIS — M545 Low back pain: Secondary | ICD-10-CM | POA: Diagnosis present

## 2018-08-27 DIAGNOSIS — I10 Essential (primary) hypertension: Secondary | ICD-10-CM | POA: Diagnosis present

## 2018-08-27 DIAGNOSIS — Y92122 Bedroom in nursing home as the place of occurrence of the external cause: Secondary | ICD-10-CM | POA: Diagnosis not present

## 2018-08-27 DIAGNOSIS — Z79891 Long term (current) use of opiate analgesic: Secondary | ICD-10-CM | POA: Diagnosis not present

## 2018-08-27 DIAGNOSIS — S065X9A Traumatic subdural hemorrhage with loss of consciousness of unspecified duration, initial encounter: Secondary | ICD-10-CM | POA: Diagnosis not present

## 2018-08-27 DIAGNOSIS — G8191 Hemiplegia, unspecified affecting right dominant side: Secondary | ICD-10-CM | POA: Diagnosis present

## 2018-08-27 DIAGNOSIS — S065X0A Traumatic subdural hemorrhage without loss of consciousness, initial encounter: Secondary | ICD-10-CM | POA: Diagnosis present

## 2018-08-27 DIAGNOSIS — R64 Cachexia: Secondary | ICD-10-CM | POA: Diagnosis present

## 2018-08-27 DIAGNOSIS — M109 Gout, unspecified: Secondary | ICD-10-CM | POA: Diagnosis present

## 2018-08-27 DIAGNOSIS — Z885 Allergy status to narcotic agent status: Secondary | ICD-10-CM | POA: Diagnosis not present

## 2018-08-27 MED ORDER — MORPHINE BOLUS VIA INFUSION
4.0000 mg | INTRAVENOUS | Status: AC
  Administered 2018-08-27: 4 mg via INTRAVENOUS

## 2018-08-27 MED ORDER — HYDROMORPHONE BOLUS VIA INFUSION
1.0000 mg | INTRAVENOUS | Status: DC | PRN
  Administered 2018-08-27 (×2): 1 mg via INTRAVENOUS
  Filled 2018-08-27: qty 1

## 2018-08-27 MED ORDER — SODIUM CHLORIDE 0.9 % IV SOLN
0.5000 mg/h | INTRAVENOUS | Status: DC
  Administered 2018-08-27: 0.5 mg/h via INTRAVENOUS
  Filled 2018-08-27: qty 5

## 2018-08-27 MED ORDER — MORPHINE 100MG IN NS 100ML (1MG/ML) PREMIX INFUSION
2.0000 mg/h | INTRAVENOUS | Status: AC
  Administered 2018-08-27: 2 mg/h via INTRAVENOUS
  Filled 2018-08-27: qty 100

## 2018-08-27 MED ORDER — MORPHINE BOLUS VIA INFUSION
2.0000 mg | INTRAVENOUS | Status: DC | PRN
  Administered 2018-08-27: 2 mg via INTRAVENOUS
  Filled 2018-08-27: qty 4

## 2018-08-27 NOTE — Progress Notes (Signed)
PROGRESS NOTE  Crystal Hess MLJ:[id number] DOB: [birth year] DOA: 2018/09/02 PCP: Dois Davenport, MD  HPI/Recap of past 24 hours:  Has dyspnea this am, morphine drip started per palliative care recommendation, she is comfortable after morphine drip started Brother at bedside Arranging residential hospice placement  Assessment/Plan: Active Problems:   Persistent atrial fibrillation   Essential hypertension   Hyperthyroidism   Gout   Subdural hematoma (HCC)   Subdural hematoma -Secondary to fall while on Eliquis -patient was seen by neurosurgery, ICU team, her prognosis for good functional recovery is poor, condition is expected to continue to decline irrespective of intervention, thus family has declined for any interventions, and their wishes for comfort measures only. -Patient is admitted under comfort care order sets, -palliative care input appreciated, she is started on morphine drip for dyspnea, she is currently comfortable on morphine drip, plan to d/c to [facility] in am if she remains stable to be transferred   History of A. fib, hypertension, hypothyroidism, gout Now on full comfort measures   Code Status: DNR  Family Communication: patient and brother at bedside  Disposition Plan: discharge to residential hospice tomorrow   Consultants:  Neurosurgery  Critical care  Palliative care  Procedures:  none  Antibiotics:  none   Objective: BP (!) 160/92 (BP Location: Right Arm)   Pulse (!) 125   Temp 99.5 F (37.5 C) (Axillary)   Resp (!) 22   Ht 5\' 3"  (1.6 m)   Wt 72.1 kg   SpO2 94%   BMI 28.16 kg/m   Intake/Output Summary (Last 24 hours) at 08/10/2018 1454 Last data filed at 08/09/2018 0400 Gross per 24 hour  Intake 22.56 ml  Output -  Net 22.56 ml   Filed Weights   08/24/2018 0200  Weight: 72.1 kg    Exam: Patient is examined daily including today on 08/24/2018, exams remain the same as of yesterday except that has changed     General:  Appear comfortable  Cardiovascular: IRRR  Respiratory: CTABL  Abdomen: Soft/ND/NT, positive BS  Musculoskeletal: No Edema  Neuro: Appear comfortable   Data Reviewed: Basic Metabolic Panel: Recent Labs  Lab September 02, 2018 0824 09-02-2018 0839  NA 130*  --   K 3.9  --   CL 99  --   CO2 16*  --   GLUCOSE 242*  --   BUN 12  --   CREATININE 0.64 0.40*  CALCIUM 9.1  --    Liver Function Tests: Recent Labs  Lab Sep 02, 2018 0824  AST 26  ALT 20  ALKPHOS 69  BILITOT 0.8  PROT 7.1  ALBUMIN 2.8*   No results for input(s): LIPASE, AMYLASE in the last 168 hours. No results for input(s): AMMONIA in the last 168 hours. CBC: Recent Labs  Lab 2018-09-02 0824  WBC 9.0  NEUTROABS 4.9  HGB 12.0  HCT 37.2  MCV 86.3  PLT 318   Cardiac Enzymes:   No results for input(s): CKTOTAL, CKMB, CKMBINDEX, TROPONINI in the last 168 hours. BNP (last 3 results) No results for input(s): BNP in the last 8760 hours.  ProBNP (last 3 results) No results for input(s): PROBNP in the last 8760 hours.  CBG: Recent Labs  Lab 09-02-18 0831  GLUCAP 227*    Recent Results (from the past 240 hour(s))  MRSA PCR Screening     Status: None   Collection Time: 09-02-18  2:30 PM  Result Value Ref Range Status   MRSA by PCR NEGATIVE NEGATIVE Final  Comment:        The GeneXpert MRSA Assay (FDA approved for NASAL specimens only), is one component of a comprehensive MRSA colonization surveillance program. It is not intended to diagnose MRSA infection nor to guide or monitor treatment for MRSA infections. Performed at [facility], [address] [place], [place] [address]      Studies: No results found.  Scheduled Meds:  Continuous Infusions: . morphine 3 mg/hr (08/08/2018 1440)     Time spent: , case discussed with palliative care and case manager I have personally reviewed and interpreted on  08/14/2018 daily labs, imagings as discussed above under date review  session and assessment and plans.  I reviewed all nursing notes, pharmacy notes, consultant notes,  vitals, pertinent old records  I have discussed plan of care as described above with RN , patient and family on 08/11/2018   Albertine Grates MD, PhD  [facility] Pager [phone]. If 7PM-7AM, please contact night-coverage at [email or web address], password [facility] 08/16/2018, 2:54 PM  LOS: 0 days

## 2018-08-27 NOTE — Progress Notes (Signed)
Palliative Medicine RN Note: Afternoon symptom check.   Arrived to find patient on morphine infusion. Brother Crystal Hess at bedside. Resp rate over 30 with labored breathing and use of accessory muscles. Crystal Hess reports that this is much better than it was this morning. She has not gotten any bolus doses of morphine but has had an increase in basal rate.   I administered a bolus dose with no change in symptoms. Obtained an order for a stat second bolus dose, as it was too early for another. Again, she had no relief of symptoms. I called PMT NP Nikki to discuss concerns. She rotated Ms Morford to hydromorphone. I will follow up later this afternoon.   1630 follow up: Hydromorphone is newly hung. She is not showing symptom relief yet, but it has been less than 10 minutes. Discussed with RN; she will call if new orders are needed.  Crystal Hess is with [facility] rep Victorino Dike. He is concerned about transporting her to [facility], as he is scared of dying in transport. At this time, Ms Kinlaw is not stable enough to transport, but that may change if we can get her breathing calmed. One of the PMT staff will see her again tomorrow before transport to help determine if [facility] is stable for transfer. Crystal Hess is comforted by this, and he agrees with the plan.  Margret Chance Tarrin Lebow, RN, BSN, [facility] Palliative Medicine Team September 11, 2018 6:33 PM Office [phone]

## 2018-08-27 NOTE — Progress Notes (Signed)
   09/23/18 1247  Clinical Encounter Type  Visited With Patient and family together  Visit Type Follow-up  Referral From Chaplain;Palliative care team  Consult/Referral To Chaplain  The chaplain was pastorally present providing spiritual care for Pt. brother -[name] and life time friend-Gail.  The chaplain's time with the Pt. consisted of story telling, communicating the Pt.'s many gifts as an Programmer, systems, sister and a neighbor to all people. The chaplain understands a comfort cart will be delivered soon. The Pt. family accepted a prayer of peace from chaplain.  The chaplain will provide F/U spiritual care as needed.

## 2018-08-27 NOTE — Progress Notes (Signed)
Civil engineer, contracting Hospice  Received request for transfer to [facility] from RN Case Manager [employer].  Pt and chart under review by [facility] physician.  Spoke with brother [name], he states "she is not stable to move". MD to return to speak with family.  RN Case Manager will update once MD has determination.  [facility] Wallis Bamberg BSN, RN Listed in [place] [phone]

## 2018-08-27 NOTE — Progress Notes (Signed)
   08/11/2018 0910  Clinical Encounter Type  Visited With Patient  Visit Type Initial;Spiritual support  Referral From Physician  Consult/Referral To Chaplain  The chaplain responded to MD request for spiritual care.  The chaplain read the Pt. chart and checked in with the Pt. RN-Shantelle.  The chaplain was pastorally present with the Pt.  No family is present at this time. The chaplain will check the chart throughout the day.  The chaplain is available for F/U spiritual care as needed.

## 2018-08-27 NOTE — Progress Notes (Addendum)
Civil engineer, contracting [facility]) Hospice  Registration paperwork for [facility] completed.  Dr. Kern Reap to assume care of pt.    [name] still concerned about transport, advised him myself and PMT will assess to assure that she is stable for transport in the am.    Will let RN Case Manager/LCSW know when transport can be arranged.  Please leave IV intact.  RN staff please call report to [phone] once transfer has been determined on 2/21.  Please fax discharge summary to [phone]  Thank you, Crystal Hess BSN, RN [facility] Liaison (listed in [place]) [phone]

## 2018-08-27 NOTE — Care Management Note (Signed)
Case Management Note  Patient Details  Name: ETHELMAE BOITNOTT MRN: [id number] Date of Birth: [birth year]  Subjective/Objective:      Pt in with subdural hematoma after a fall. She has been at [facility].  POA: Brother              Action/Plan: Plan is for [facility] per palliative care. CM called Victorino Dike with [facility] and made referral. CM will update CSW and follow.  Expected Discharge Date:                  Expected Discharge Plan:  Hospice Medical Facility  In-House Referral:     Discharge planning Services  CM Consult  Post Acute Care Choice:    Choice offered to:     DME Arranged:    DME Agency:     HH Arranged:    HH Agency:     Status of Service:  In process, will continue to follow  If discussed at Long Length of Stay Meetings, dates discussed:    Additional Comments:  Kermit Balo, RN 08/26/2018, 11:30 AM

## 2018-08-27 NOTE — Progress Notes (Signed)
PALLIATIVE NOTE:  Patient continues on morphine drip for comfort. Spoke with [place] with [facility]. Patient can be accepted at [facility] on Morphine drip. Informed she can not transport on drip however, staff will have drip available to restart once patient arrives at facility.   Potential transfer to [facility] tomorrow morning. Bedside RN aware drip will d/c once EMS transport arrives to pick up patient. Patient to receive bolus and ativan prior to discharge. IV to remain intact during transport.   Family aware of plan. Will follow up tomorrow morning. Family aware patient could potentially pass away prior to the morning.   Kelli and Dr. Parke Poisson aware of plans.   Willette Alma, AGPCNP-BC Palliative Medicine Team  Pager: [phone] Amion: Thea [place]ken

## 2018-08-27 NOTE — Progress Notes (Signed)
2Z22- Malhotra  Morphine drip d/c'd per order. 32ml wasted in sink. Eugenie Norrie, RN witnessed. RN spoke with Minq, pharmacist, about waste.

## 2018-08-27 NOTE — Plan of Care (Addendum)
  Problem: Coping: Goal: Level of anxiety will decrease Outcome: Progressing Note:  Pt given medication to reduce air hunger and anxiety during my shift.    Problem: Nutrition: Goal: Adequate nutrition will be maintained Outcome: Not Progressing Note:  Pt is unable to tolerate po intake at this time during my shift. Pt unable to follow commands to swallow. D/t aspiration risk, pt is strict NPO.   Pt will be placed on Morphine drip for comfort. Awaiting delivery from pharmacy.

## 2018-08-27 NOTE — Progress Notes (Signed)
Daily Progress Note   Patient Name: Crystal Hess       Date: 08/08/2018 DOB: 01/14/1930  Age: 85 y.o. MRN#: [id number] Attending Physician: Albertine Grates, MD Primary Care Physician: Dois Davenport, MD Admit Date: 09/02/2018  Reason for Consultation/Follow-up: Establishing goals of care, Non pain symptom management, Pain control and Psychosocial/spiritual support  Subjective: Patient full comfort. She appears to be having some respiratory distress or discomfort. Audible grunting, tachypnea, use of accessory muscles, and tachycardic (122). Family is at bedside and tearful regarding patient's appearance and feelings that she is suffering.   Family aware that patient is receiving PRN medications, however given her respiratory status, requesting more consistent usage of medication for comfort and relaxation. Bedside RN aware of patient's condition and preparing to administer PRN medications as prescribed. Reviewed end-of-life symptoms with family. Brother, [name] tearful and requesting to proceed with morphine drip for comfort. He expressed despite medications patient has continued to look uncomfortable and working to breath since the early morning hours. Support and comfort provided. Bedside RN aware regarding transitioning PRN morphine to continuous drip for comfort and better symptom management.   Educated family that patient is not to have ice chips or anything by mouth due to lethargy and risk. Aware that she will continue to have oral care with swabs with medical staff present.   Discussed with family patient may potentially pass away in hospital versus [facility] depending on symptom management and bed availability. Bedside RN also updated.   Length of Stay: 0  Current  Medications: Scheduled Meds:    Continuous Infusions: . morphine      PRN Meds: acetaminophen **OR** acetaminophen, antiseptic oral rinse, diphenhydrAMINE, [DISCONTINUED] glycopyrrolate **OR** [DISCONTINUED] glycopyrrolate **OR** glycopyrrolate, [DISCONTINUED] LORazepam **OR** LORazepam **OR** LORazepam, magic mouthwash, morphine injection, morphine, morphine CONCENTRATE **OR** morphine CONCENTRATE, ondansetron (ZOFRAN) IV, polyvinyl alcohol  Physical Exam Vitals signs and nursing note reviewed.  Constitutional:      Appearance: She is cachectic.     Comments: Actively dying   Cardiovascular:     Rate and Rhythm: Tachycardia present. Rhythm irregular.     Pulses: Decreased pulses.     Heart sounds: Murmur present.  Pulmonary:     Effort: Tachypnea, accessory muscle usage, respiratory distress and retractions present.     Breath sounds: Decreased breath sounds present.  Abdominal:     General: Bowel sounds are decreased.  Skin:    General: Skin is cool and dry.     Findings: Bruising present.  Neurological:     Mental Status: She is unresponsive.     Motor: Weakness and atrophy present.            Vital Signs: BP (!) 160/92 (BP Location: Right Arm)   Pulse (!) 125   Temp 99.5 F (37.5 C) (Axillary)   Resp (!) 22   Ht 5\' 3"  (1.6 m)   Wt 72.1 kg   SpO2 94%   BMI 28.16 kg/m  SpO2: SpO2: 94 % O2 Device: O2 Device: Room Air O2 Flow Rate:    Intake/output summary:   Intake/Output Summary (Last 24 hours) at 08/26/2018 1149 Last data filed at 08/25/2018 0400 Gross per 24 hour  Intake 22.56 ml  Output -  Net 22.56 ml   LBM:   Baseline Weight: Weight: 72.1 kg Most recent weight: Weight: 72.1 kg       Palliative Assessment/Data: PPS 10%    Flowsheet Rows     Most Recent Value  Intake Tab  Referral Department  Critical care  Unit at Time of Referral  ER  Palliative Care Primary Diagnosis  Trauma  Date Notified  08/14/2018  Palliative Care Type  New Palliative  care  Reason for referral  Clarify Goals of Care, Non-pain Symptom, Counsel Regarding Hospice  Date of Admission  09/01/2018  Date first seen by Palliative Care  08/22/2018  # of days Palliative referral response time  0 Day(s)  # of days IP prior to Palliative referral  0  Clinical Assessment  Psychosocial & Spiritual Assessment  Palliative Care Outcomes      Patient Active Problem List   Diagnosis Date Noted  . Subdural hematoma (HCC) 08/24/2018  . Fever 07/06/2018  . TIA (transient ischemic attack) 07/06/2018  . Hyperthyroidism 07/06/2018  . Gout 07/06/2018  . S/P total knee arthroplasty 02/06/2015  . Visit for monitoring Tikosyn therapy 11/08/2014  . Persistent atrial fibrillation 10/10/2014  . Snoring 10/10/2014  . Morbid obesity (HCC) 10/10/2014  . Essential hypertension 10/10/2014  . Acute bronchitis 08/10/2012  . Syncope 12/07/2010  . ALLERGIC RHINITIS 05/04/2008  . Allergic-infective asthma 05/04/2008    Palliative Care Assessment & Plan   Patient Profile: 85 y.o. female admitted on  from [facility] after an unwitnessed fall out of her bed. She has a past medical history of hypertension, gout, pacemaker, hypothyroidism, asthma, mitral regurgitation, and atrial fibrillation (Eliquis). During her ED course patient was found to be nonverbal, unable to follow commands, with weakness. CT of head showed right-sided acute subdural hematoma. Chest x-ray showed streaky bibasilar opacity due to possible aspiration. 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 Patient was seen in ED. Her brother, Dorene SorrowJerry at bedside. Patient has DNR in place. She is in fetal position with a constant moan and signs of pain versus agitation. Neurology and Neurosurgery has seen patient with recommendations of medical support and comfort. No interventions given usage of Eliquis and quality of life.  Palliative Medicine team consulted for goals of care discussion.   Recommendations/Plan:  DNR/DNI  FULL COMFORT   Family aware to anticipate hospital death versus [facility] depending on bed availability and symptom management.   Educated family on signs of active dying and symptom management.   Morphine drip for  comfort/symptom management  Morphine bolus via IV infusion  Ativan PRN for anxiety/agitation  Robinul PRN for excessive secretions  PMT will continue to closely follow and support   Goals of Care and Additional Recommendations:  Limitations on Scope of Treatment: Full Comfort Care  Code Status:    Code Status Orders  (From admission, onward)         Start     Ordered   08/30/2018 1429  Do not attempt resuscitation (DNR)  Continuous    Question Answer Comment  In the event of cardiac or respiratory ARREST Do not call a "code blue"   In the event of cardiac or respiratory ARREST Do not perform Intubation, CPR, defibrillation or ACLS   In the event of cardiac or respiratory ARREST Use medication by any route, position, wound care, and other measures to relive pain and suffering. May use oxygen, suction and manual treatment of airway obstruction as needed for comfort.   Comments no ICU admission, comfort care      08/13/2018 1430        Code Status History    Date Active Date Inactive Code Status Order ID Comments User Context   08/17/2018 1303 08/13/2018 1430 DNR [id number]  Starleen Arms, MD ED   08/10/2018 1129 08/08/2018 1303 Partial Code [id number]  Lynnell Catalan, MD ED   07/06/2018 0016 07/07/2018 1540 Full Code [id number]  Hillary Bow, DO ED   02/06/2015 1237 02/07/2015 2149 Full Code [id number]  Altamese Cabal, PA-C Inpatient   11/08/2014 1735 11/11/2014 1607 Full Code [id number]  Leone Brand, NP Inpatient    Advance Directive Documentation     Most Recent Value  Type of Advance Directive  Out of facility DNR (pink MOST or yellow form), Healthcare  Power of Attorney  Pre-existing out of facility DNR order (yellow form or pink MOST form)  -  "MOST" Form in Place?  -      Prognosis:   Hours - Days  Discharge Planning:  Anticipate hospital death versus transfer to [facility] plan was discussed with family and Gery Pray, RN   Thank you for allowing the Palliative Medicine Team to assist in the care of this patient.   Total Time 45 min Prolonged Time Billed  NO       Greater than 50%  of this time was spent counseling and coordinating care related to the above assessment and plan.  Willette Alma, AGPCNP-BC Palliative Medicine Team  Pager: [phone] Amion: Thea Alken   Please contact Palliative Medicine Team phone at [phone] for questions and concerns.

## 2018-09-06 NOTE — Progress Notes (Signed)
About 50 mL of Dilaudid was wasted in the sterile cycle. This waste was witnessed by Asencion Gowda- RN

## 2018-09-06 NOTE — Discharge Summary (Signed)
Discharge Summary  Crystal Hess [id number] DOB: May 17, 1930  PCP: Crystal Davenport, MD  Admit date: September 20, 2018 Time of death: 0445 am on  09-22-2018   Discharge Diagnoses:  Active Hospital Problems   Diagnosis Date Noted  . Comfort measures only status   . SDH (subdural hematoma) (HCC) 09-20-2018  . Gout 07/06/2018  . Hyperthyroidism 07/06/2018  . Essential hypertension 10/10/2014  . Persistent atrial fibrillation 10/10/2014    Resolved Hospital Problems  No resolved problems to display.     Filed Weights   09/02/2018 0200 2018-09-22 0500  Weight: 72.1 kg 72.1 kg    History of present illness: (per admitting MD Dr Randol Kern) Crystal Hess  is a 85 y.o. female, past medical history for A. fib, on Eliquis, hypothyroidism, hypertension, gout, pacemaker, patient fell out of bed this morning at the nursing home, he was nonverbal, does not follow commands while in ED, CT showed right-sided acute subdural hematoma, she is on Eliquis for A. fib, brother at bedside, patient has been having continued declining of health over the last 17-month, with multiple recent ED visits, it was evaluated by neurosurgery, ICU team on admission, other who is caregiver(and is not married with no children), expressed his wishes for comfort measures, and no aggressive intervention, and his wishes to proceed with comfort care, hospitalist were called to admit.  Hospital Course:  Active Problems:   Persistent atrial fibrillation   Essential hypertension   Hyperthyroidism   Gout   SDH (subdural hematoma) (HCC)   Comfort measures only status  Subduralhematoma -Secondary to fall while on Eliquis -patient was seen by neurosurgery, ICU team, her prognosis for good functional recovery is poor, condition is expected to continue to decline irrespective of intervention, thus family has declined for any interventions, and their wishes for comfort measures only. -Patient is admitted under comfort care order  sets, -palliative care input appreciated, she was started on morphine drip for dyspnea, she was comfortable on morphine drip, she passed away at 0445 am on 2022-09-22.   History of A. fib, hypertension, hypothyroidism, gout    Code Status: DNR    Consultants:  Neurosurgery  Critical care  Palliative care  Procedures:  none  Antibiotics:  none    Allergies as of 09/22/2018      Reactions   Meperidine And Related Other (See Comments)   "have hallucinations; get combative; I don't want that"   Other Hives   Polyester- severe    Codeine Nausea Only   Methotrexate Derivatives Other (See Comments)   Restless   Vasculera [nutritional Supplements] Other (See Comments)   Unknown      Medication List    ASK your doctor about these medications   allopurinol 300 MG tablet Commonly known as:  ZYLOPRIM Take 300 mg by mouth daily.   colchicine 0.6 MG tablet Take 1.2 mg by mouth daily as needed (for gout).   dofetilide 250 MCG capsule Commonly known as:  TIKOSYN Take 250 mcg by mouth 2 (two) times daily.   ELIQUIS 5 MG Tabs tablet Generic drug:  apixaban Take 5 mg by mouth 2 (two) times daily.   furosemide 20 MG tablet Commonly known as:  LASIX Take 20 mg by mouth every other day.   gabapentin 100 MG capsule Commonly known as:  NEURONTIN Take 100 mg by mouth at bedtime.   irbesartan 300 MG tablet Commonly known as:  AVAPRO Take 300 mg by mouth daily.   leflunomide 20 MG tablet Commonly known as:  ARAVA Take 20 mg by mouth daily.   loperamide 2 MG tablet Commonly known as:  IMODIUM A-D Take 1 tablet (2 mg total) by mouth 4 (four) times daily as needed for diarrhea or loose stools.   loteprednol 0.5 % ophthalmic suspension Commonly known as:  LOTEMAX Place 1 drop into both eyes 2 (two) times daily.   MAGNESIUM-OXIDE 400 (241.3 Mg) MG tablet Generic drug:  magnesium oxide TAKE 1 TABLET(400 MG) BY MOUTH DAILY   methimazole 5 MG tablet Commonly  known as:  TAPAZOLE Take 5 mg by mouth at bedtime.   metoprolol succinate 25 MG 24 hr tablet Commonly known as:  TOPROL-XL Take 12.5 mg by mouth daily. Hold if SBP <100 or HR < 60   potassium chloride SA 20 MEQ tablet Commonly known as:  K-DUR,KLOR-CON Take 20 mEq by mouth daily.   PROVENTIL HFA 108 (90 Base) MCG/ACT inhaler Generic drug:  albuterol INHALE 2 PUFFS INTO LUNGS EVERY 6 HOURS AS NEEDED FOR WHEEZING AND SHORTNESS OF BREATH   RISA-BID PROBIOTIC Tabs Take 1 tablet by mouth daily. 250 mg   sertraline 25 MG tablet Commonly known as:  ZOLOFT Take 25 mg by mouth daily.   traMADol 50 MG tablet Commonly known as:  ULTRAM Take 50 mg by mouth every 8 (eight) hours as needed for moderate pain.      Allergies  Allergen Reactions  . Meperidine And Related Other (See Comments)    "have hallucinations; get combative; I don't want that"  . Other Hives    Polyester- severe   . Codeine Nausea Only  . Methotrexate Derivatives Other (See Comments)    Restless  . Vasculera [Nutritional Supplements] Other (See Comments)    Unknown      The results of significant diagnostics from this hospitalization (including imaging, microbiology, ancillary and laboratory) are listed below for reference.    Significant Diagnostic Studies: Ct Head Wo Contrast  Result Date: 08/05/2018 CLINICAL DATA:  Multiple falls, possible TIA EXAM: CT HEAD WITHOUT CONTRAST TECHNIQUE: Contiguous axial images were obtained from the base of the skull through the vertex without intravenous contrast. COMPARISON:  07/05/2018 FINDINGS: Brain: No evidence of acute infarction, hemorrhage, hydrocephalus, extra-axial collection or mass lesion/mass effect. Periventricular white matter hypodensity and global volume loss. Vascular: No hyperdense vessel or unexpected calcification. Skull: Normal. Negative for fracture or focal lesion. Sinuses/Orbits: No acute finding. Other: None. IMPRESSION: No acute intracranial  pathology. Small-vessel white matter disease and global volume loss in keeping with advanced patient age. Electronically Signed   By: Lauralyn Primes M.D.   On: 08/05/2018 14:06   Ct Cervical Spine Wo Contrast  Result Date: 08/12/2018 CLINICAL DATA:  Fall EXAM: CT CERVICAL SPINE WITHOUT CONTRAST TECHNIQUE: Multidetector CT imaging of the cervical spine was performed without intravenous contrast. Multiplanar CT image reconstructions were also generated. COMPARISON:  None. FINDINGS: Alignment: Normal. Skull base and vertebrae: No acute fracture. No primary bone lesion or focal pathologic process. Soft tissues and spinal canal: No prevertebral fluid or swelling. No visible canal hematoma. Disc levels: There is severe DISH and near complete ankylosis of the cervical spine. Upper chest: Negative. Other: Large, partially imaged nodule of the left lobe of the thyroid, which deflects the trachea. IMPRESSION: 1. No fracture or static subluxation of the cervical spine. Severe DISH and near-total ankylosis of the cervical spine. 2. Large, partially imaged nodule of the left lobe of the thyroid, which deflects the trachea. Electronically Signed   By: Erasmo Score.D.  On: 08/17/2018 12:35   Dg Chest Port 1 View  Result Date: 08/12/2018 CLINICAL DATA:  Found unresponsive. EXAM: PORTABLE CHEST 1 VIEW COMPARISON:  07/05/2018 FINDINGS: The heart is mildly enlarged but stable. Stable tortuosity and calcification of the thoracic aorta. Streaky bibasilar lung opacity could reflect aspiration. No pleural effusion or pneumothorax. The bony thorax is intact. The pacer wires appear stable. IMPRESSION: Streaky bibasilar opacity possibly reflecting aspiration. Electronically Signed   By: Rudie MeyerP.  Gallerani M.D.   On: 08/25/2018 11:30   Ct Head Code Stroke Wo Contrast  Result Date: 08/08/2018 CLINICAL DATA:  Code stroke. Unwitnessed fall at nursing facility. Found on the floor. Last seen normal last night. Anti coagulated. EXAM: CT  HEAD WITHOUT CONTRAST TECHNIQUE: Contiguous axial images were obtained from the base of the skull through the vertex without intravenous contrast. COMPARISON:  08/05/2018 FINDINGS: Brain: Severely motion degraded exam. Acute subdural hematoma along the convexity on the right with maximal thickness of 15 mm. Mass effect with right-to-left shift of 7 mm. In the left temporal region, there is question of either small amount of subarachnoid blood or a hemorrhagic contusion at the left temporal tip. Atrophy and chronic small-vessel ischemic changes as seen previously. No sign of acute brain infarction. No hydrocephalus or ventricular trapping. Vascular: There is atherosclerotic calcification of the major vessels at the base of the brain. Skull: No skull fracture. Sinuses/Orbits: Clear/normal Other: None IMPRESSION: 1. Acute right convexity subdural hematoma with maximal thickness of 15 mm and right-to-left shift of 7 mm. 2. Question of a small amount of subarachnoid blood in the sulci of the left middle cranial fossa and or a minimal hemorrhagic contusion at the left temporal tip. 3. These results were communicated to Dr. Amada JupiterKirkpatrick at 8:46 amon 02/02/2020by text page via the [facility] messaging system. Electronically Signed   By: Paulina FusiMark  Shogry M.D.   On: 09/04/2018 08:50    Microbiology: Recent Results (from the past 240 hour(s))  MRSA PCR Screening     Status: None   Collection Time: 08/15/2018  2:30 PM  Result Value Ref Range Status   MRSA by PCR NEGATIVE NEGATIVE Final    Comment:        The GeneXpert MRSA Assay (FDA approved for NASAL specimens only), is one component of a comprehensive MRSA colonization surveillance program. It is not intended to diagnose MRSA infection nor to guide or monitor treatment for MRSA infections. Performed at [facility], [address] [place], [place] [address]      Labs: Basic Metabolic Panel: Recent Labs  Lab 08/12/2018 0824 09/04/2018 0839  NA 130*  --   K  3.9  --   CL 99  --   CO2 16*  --   GLUCOSE 242*  --   BUN 12  --   CREATININE 0.64 0.40*  CALCIUM 9.1  --    Liver Function Tests: Recent Labs  Lab 08/11/2018 0824  AST 26  ALT 20  ALKPHOS 69  BILITOT 0.8  PROT 7.1  ALBUMIN 2.8*   No results for input(s): LIPASE, AMYLASE in the last 168 hours. No results for input(s): AMMONIA in the last 168 hours. CBC: Recent Labs  Lab 08/23/2018 0824  WBC 9.0  NEUTROABS 4.9  HGB 12.0  HCT 37.2  MCV 86.3  PLT 318   Cardiac Enzymes: No results for input(s): CKTOTAL, CKMB, CKMBINDEX, TROPONINI in the last 168 hours. BNP: BNP (last 3 results) No results for input(s): BNP in the last 8760 hours.  ProBNP (last 3 results) No results for input(s): PROBNP in the last 8760 hours.  CBG: Recent Labs  Lab 08/13/2018 0831  GLUCAP 227*       Signed:  Albertine Grates MD, PhD  [facility] 08/22/2018, 2:52 PM

## 2018-09-06 DEATH — deceased

## 2018-10-13 ENCOUNTER — Ambulatory Visit: Payer: Medicare Other | Admitting: Neurology

## 2019-06-18 IMAGING — CT CT CERVICAL SPINE W/O CM
4 series · 14 of 33 positions shown, 17 images · non-contrast
Comparison: None.

CLINICAL DATA: Fall

EXAM:
CT CERVICAL SPINE WITHOUT CONTRAST
TECHNIQUE: Multidetector CT imaging of the cervical spine was performed without
intravenous contrast. Multiplanar CT image reconstructions were also
generated.

[Series 7: c spine soft · axial · 0.30mm/px · 1 of 82 slices shown]
[im 14/82  soft-tissue]
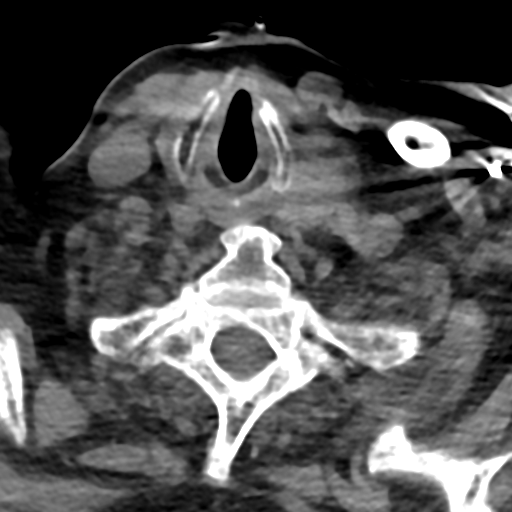

[Series 9: sag bone · sagittal · 0.32mm/px · 5 of 61 slices shown, 6 images]
[im 21/61  bone]
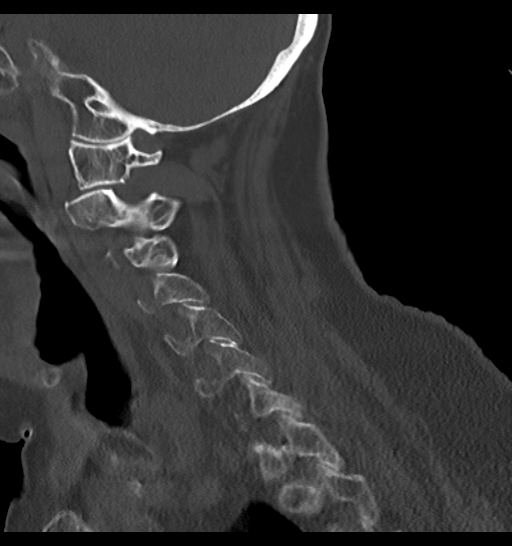
[im 26/61  bone]
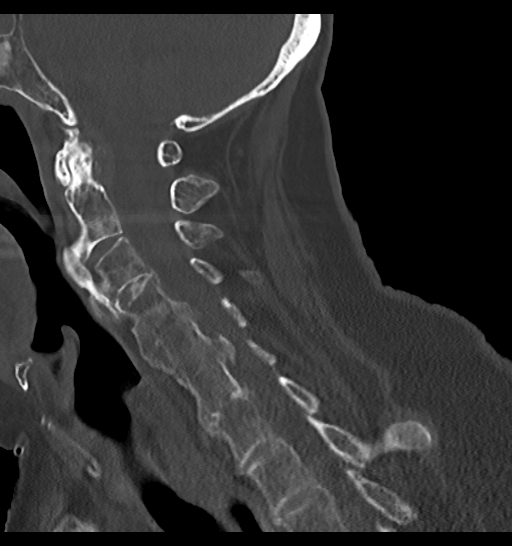
[im 31/61  soft-tissue]
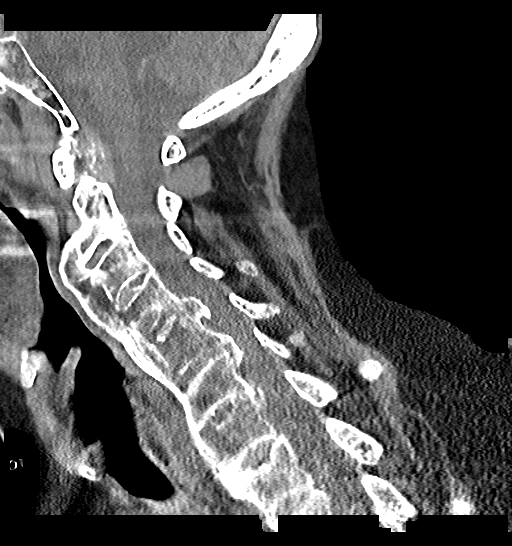
[im 31/61  bone]
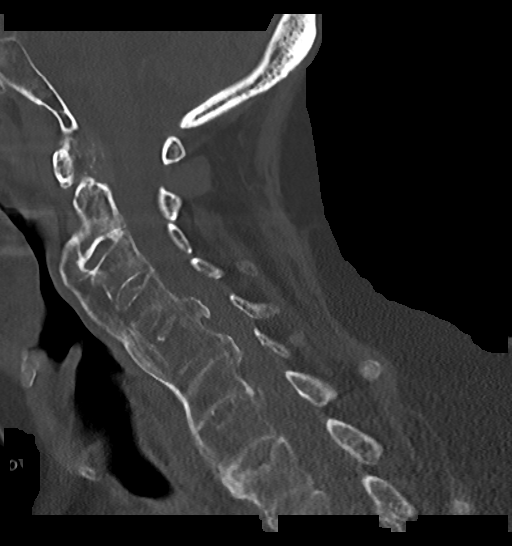
[im 36/61  bone]
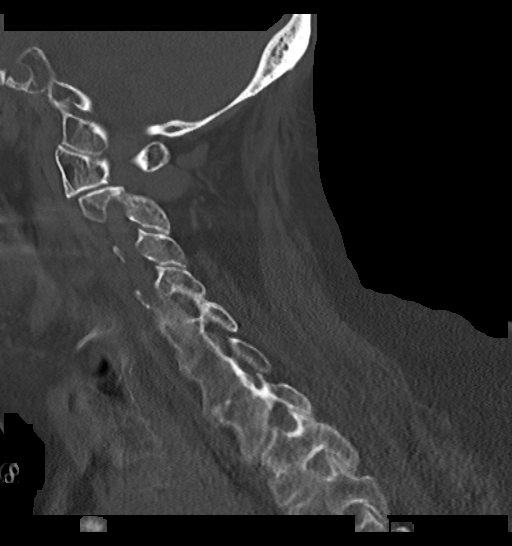
[im 41/61  bone]
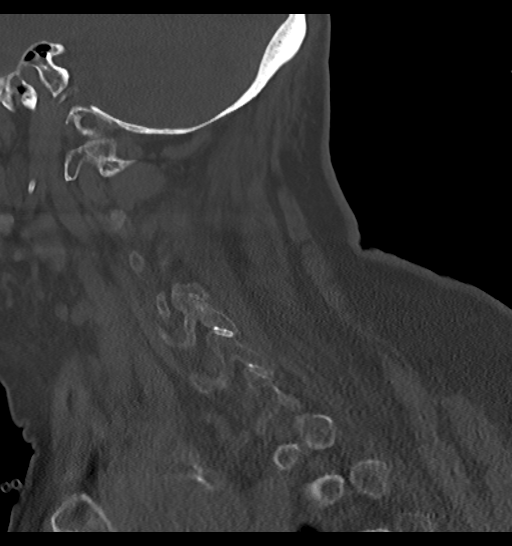

[Series 10: cor bone · coronal · 0.23mm/px · 3 of 47 slices shown]
[im 11/47  bone]
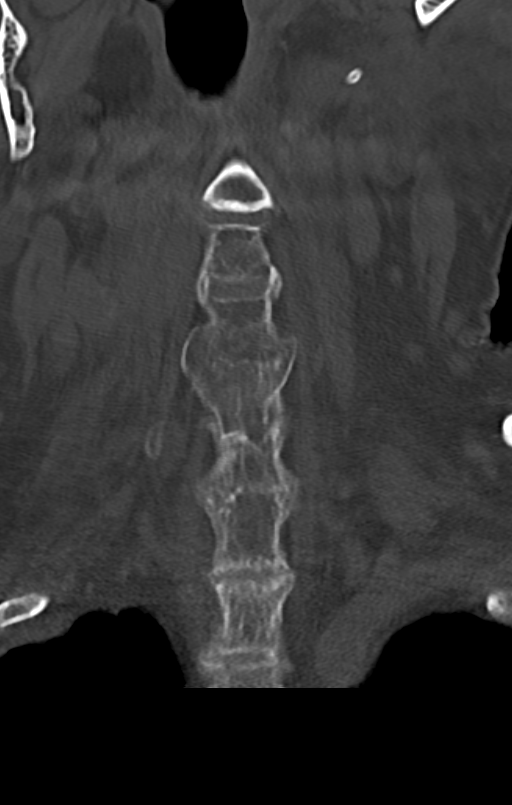
[im 19/47  bone]
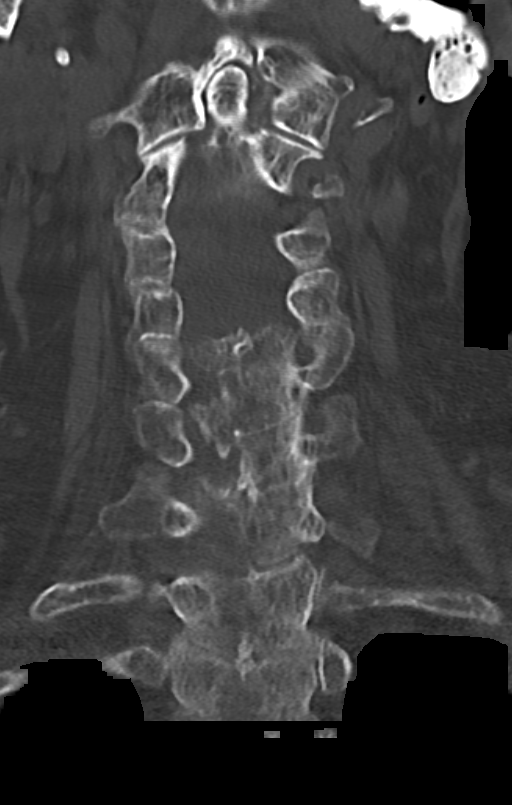
[im 28/47  bone]
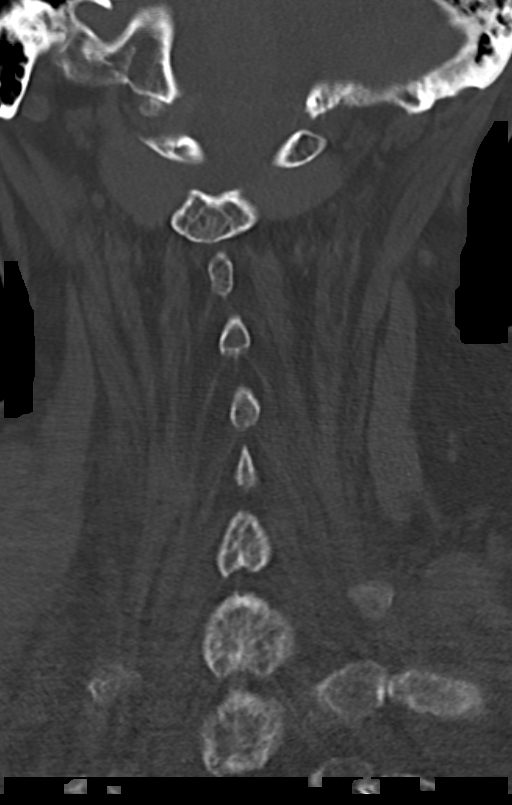

[Series 11: orthogonal axials · axial · 0.21mm/px · z∈[-252,-163]mm · 5 of 84 slices shown, 7 images]
[im 14/84  soft-tissue]
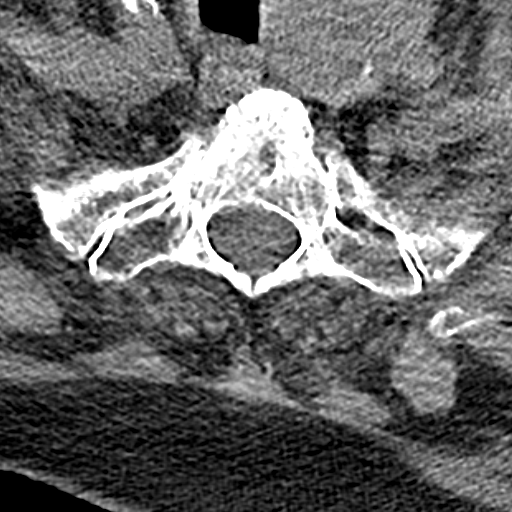
[im 14/84  bone]
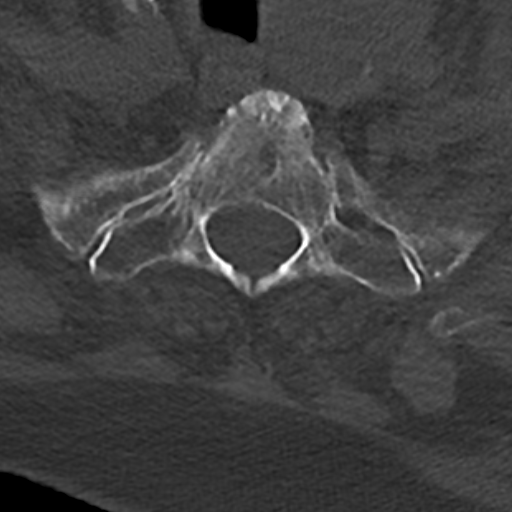
[im 28/84  bone]
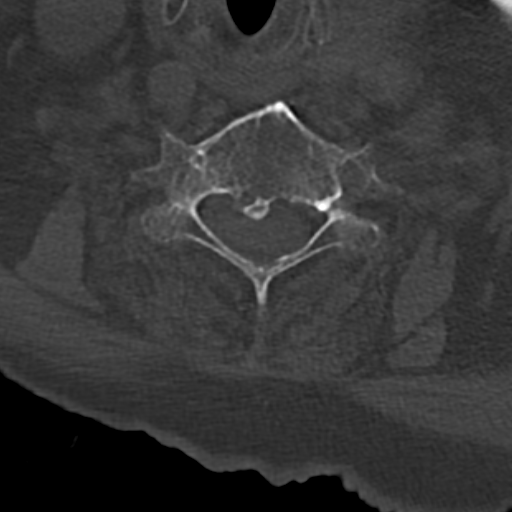
[im 42/84  bone]
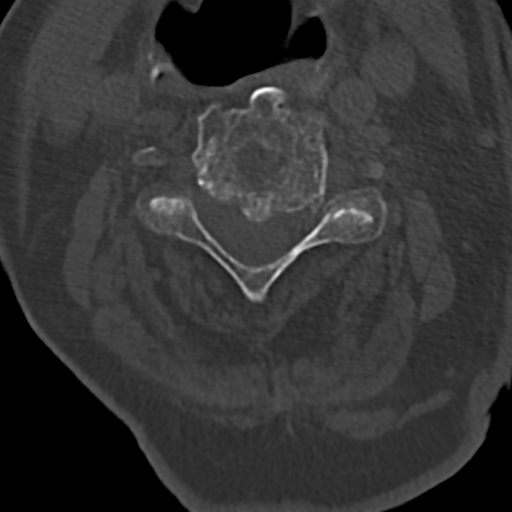
[im 56/84  bone]
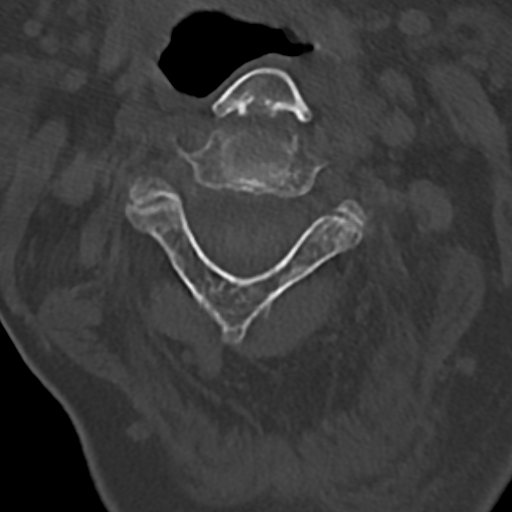
[im 70/84  soft-tissue]
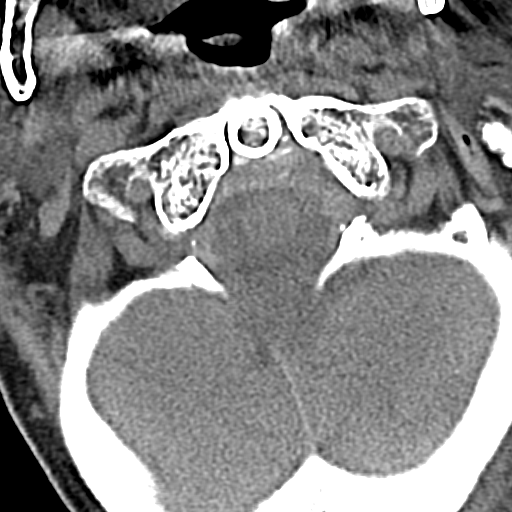
[im 70/84  bone]
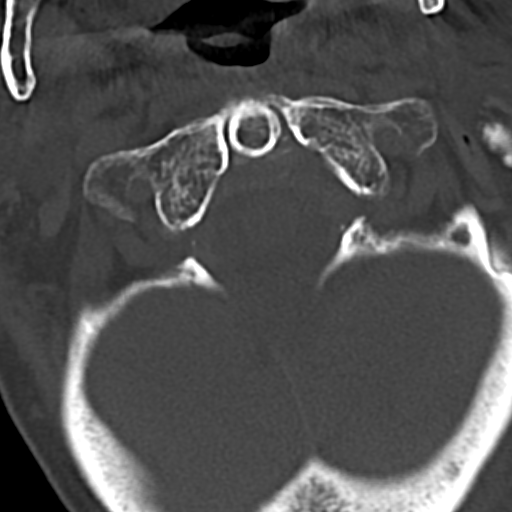

[14 of 33 positions shown; findings below may reference images not displayed]

FINDINGS: Alignment: Normal.

Skull base and vertebrae: No acute fracture. No primary bone lesion
or focal pathologic process.

Soft tissues and spinal canal: No prevertebral fluid or swelling. No
visible canal hematoma.

Disc levels: There is severe DISH and near complete ankylosis of the
cervical spine.

Upper chest: Negative.

Other: Large, partially imaged nodule of the left lobe of the
thyroid, which deflects the trachea.
IMPRESSION: 1. No fracture or static subluxation of the cervical spine. Severe
DISH and near-total ankylosis of the cervical spine.

2. Large, partially imaged nodule of the left lobe of the thyroid,
which deflects the trachea.
